# Patient Record
Sex: Male | Born: 1960 | Race: Black or African American | Hispanic: No | Marital: Single | Smoking: Never smoker
Health system: Southern US, Community
[De-identification: ages and names within clinical notes are randomized; demographics above are authoritative.]

## PROBLEM LIST (undated history)

## (undated) DIAGNOSIS — I10 Essential (primary) hypertension: Secondary | ICD-10-CM

## (undated) DIAGNOSIS — T7840XA Allergy, unspecified, initial encounter: Secondary | ICD-10-CM

## (undated) DIAGNOSIS — F32A Depression, unspecified: Secondary | ICD-10-CM

## (undated) DIAGNOSIS — F329 Major depressive disorder, single episode, unspecified: Secondary | ICD-10-CM

## (undated) DIAGNOSIS — E785 Hyperlipidemia, unspecified: Secondary | ICD-10-CM

## (undated) DIAGNOSIS — J189 Pneumonia, unspecified organism: Secondary | ICD-10-CM

## (undated) DIAGNOSIS — M1990 Unspecified osteoarthritis, unspecified site: Secondary | ICD-10-CM

## (undated) DIAGNOSIS — M109 Gout, unspecified: Secondary | ICD-10-CM

## (undated) DIAGNOSIS — G4730 Sleep apnea, unspecified: Secondary | ICD-10-CM

## (undated) DIAGNOSIS — K219 Gastro-esophageal reflux disease without esophagitis: Secondary | ICD-10-CM

## (undated) DIAGNOSIS — F79 Unspecified intellectual disabilities: Secondary | ICD-10-CM

## (undated) HISTORY — DX: Pneumonia, unspecified organism: J18.9

## (undated) HISTORY — DX: Unspecified osteoarthritis, unspecified site: M19.90

## (undated) HISTORY — PX: BRAIN SURGERY: SHX531

## (undated) HISTORY — DX: Gastro-esophageal reflux disease without esophagitis: K21.9

## (undated) HISTORY — DX: Depression, unspecified: F32.A

## (undated) HISTORY — DX: Allergy, unspecified, initial encounter: T78.40XA

## (undated) HISTORY — DX: Unspecified intellectual disabilities: F79

## (undated) HISTORY — DX: Sleep apnea, unspecified: G47.30

## (undated) HISTORY — DX: Major depressive disorder, single episode, unspecified: F32.9

---

## 1997-11-18 ENCOUNTER — Inpatient Hospital Stay (HOSPITAL_COMMUNITY): Admission: EM | Admit: 1997-11-18 | Discharge: 1997-11-22 | Payer: Self-pay | Admitting: Emergency Medicine

## 1997-12-29 ENCOUNTER — Inpatient Hospital Stay (HOSPITAL_COMMUNITY): Admission: EM | Admit: 1997-12-29 | Discharge: 1998-01-03 | Payer: Self-pay | Admitting: *Deleted

## 1998-01-06 ENCOUNTER — Encounter: Admission: RE | Admit: 1998-01-06 | Discharge: 1998-01-06 | Payer: Self-pay | Admitting: Family Medicine

## 1998-01-24 ENCOUNTER — Ambulatory Visit (HOSPITAL_COMMUNITY): Admission: RE | Admit: 1998-01-24 | Discharge: 1998-01-24 | Payer: Self-pay | Admitting: Sports Medicine

## 1998-02-02 ENCOUNTER — Ambulatory Visit: Admission: RE | Admit: 1998-02-02 | Discharge: 1998-02-02 | Payer: Self-pay | Admitting: *Deleted

## 1998-05-02 ENCOUNTER — Inpatient Hospital Stay (HOSPITAL_COMMUNITY): Admission: EM | Admit: 1998-05-02 | Discharge: 1998-05-06 | Payer: Self-pay | Admitting: Emergency Medicine

## 1998-05-02 ENCOUNTER — Encounter: Payer: Self-pay | Admitting: Emergency Medicine

## 1998-06-04 ENCOUNTER — Encounter: Payer: Self-pay | Admitting: Emergency Medicine

## 1998-06-04 ENCOUNTER — Inpatient Hospital Stay (HOSPITAL_COMMUNITY): Admission: EM | Admit: 1998-06-04 | Discharge: 1998-06-06 | Payer: Self-pay | Admitting: Emergency Medicine

## 1998-06-24 ENCOUNTER — Emergency Department (HOSPITAL_COMMUNITY): Admission: EM | Admit: 1998-06-24 | Discharge: 1998-06-24 | Payer: Self-pay | Admitting: Emergency Medicine

## 1998-07-03 ENCOUNTER — Inpatient Hospital Stay (HOSPITAL_COMMUNITY): Admission: EM | Admit: 1998-07-03 | Discharge: 1998-07-05 | Payer: Self-pay | Admitting: Emergency Medicine

## 1998-07-03 ENCOUNTER — Encounter: Payer: Self-pay | Admitting: Emergency Medicine

## 1998-07-03 ENCOUNTER — Encounter: Payer: Self-pay | Admitting: *Deleted

## 1998-07-22 ENCOUNTER — Inpatient Hospital Stay (HOSPITAL_COMMUNITY): Admission: EM | Admit: 1998-07-22 | Discharge: 1998-07-26 | Payer: Self-pay | Admitting: *Deleted

## 1998-07-23 ENCOUNTER — Encounter: Payer: Self-pay | Admitting: *Deleted

## 1999-03-12 ENCOUNTER — Emergency Department (HOSPITAL_COMMUNITY): Admission: EM | Admit: 1999-03-12 | Discharge: 1999-03-12 | Payer: Self-pay | Admitting: Emergency Medicine

## 1999-03-23 ENCOUNTER — Ambulatory Visit (HOSPITAL_COMMUNITY): Admission: RE | Admit: 1999-03-23 | Discharge: 1999-03-23 | Payer: Self-pay | Admitting: *Deleted

## 1999-04-11 ENCOUNTER — Ambulatory Visit (HOSPITAL_COMMUNITY): Admission: RE | Admit: 1999-04-11 | Discharge: 1999-04-11 | Payer: Self-pay | Admitting: *Deleted

## 1999-10-17 ENCOUNTER — Emergency Department (HOSPITAL_COMMUNITY): Admission: EM | Admit: 1999-10-17 | Discharge: 1999-10-17 | Payer: Self-pay | Admitting: Emergency Medicine

## 2000-06-02 ENCOUNTER — Encounter: Payer: Self-pay | Admitting: Emergency Medicine

## 2000-06-02 ENCOUNTER — Emergency Department (HOSPITAL_COMMUNITY): Admission: EM | Admit: 2000-06-02 | Discharge: 2000-06-02 | Payer: Self-pay | Admitting: Emergency Medicine

## 2000-07-25 ENCOUNTER — Ambulatory Visit (HOSPITAL_BASED_OUTPATIENT_CLINIC_OR_DEPARTMENT_OTHER): Admission: RE | Admit: 2000-07-25 | Discharge: 2000-07-25 | Payer: Self-pay | Admitting: Internal Medicine

## 2000-12-26 ENCOUNTER — Emergency Department (HOSPITAL_COMMUNITY): Admission: EM | Admit: 2000-12-26 | Discharge: 2000-12-26 | Payer: Self-pay | Admitting: Emergency Medicine

## 2001-01-11 ENCOUNTER — Emergency Department (HOSPITAL_COMMUNITY): Admission: EM | Admit: 2001-01-11 | Discharge: 2001-01-12 | Payer: Self-pay | Admitting: Emergency Medicine

## 2001-05-31 ENCOUNTER — Inpatient Hospital Stay (HOSPITAL_COMMUNITY): Admission: EM | Admit: 2001-05-31 | Discharge: 2001-06-03 | Payer: Self-pay | Admitting: Nurse Practitioner

## 2001-05-31 ENCOUNTER — Encounter: Payer: Self-pay | Admitting: Emergency Medicine

## 2001-06-02 ENCOUNTER — Encounter: Payer: Self-pay | Admitting: Internal Medicine

## 2001-10-30 ENCOUNTER — Emergency Department (HOSPITAL_COMMUNITY): Admission: EM | Admit: 2001-10-30 | Discharge: 2001-10-30 | Payer: Self-pay | Admitting: Emergency Medicine

## 2002-03-02 ENCOUNTER — Encounter: Payer: Self-pay | Admitting: Emergency Medicine

## 2002-03-02 ENCOUNTER — Emergency Department (HOSPITAL_COMMUNITY): Admission: EM | Admit: 2002-03-02 | Discharge: 2002-03-02 | Payer: Self-pay | Admitting: Emergency Medicine

## 2003-01-06 ENCOUNTER — Emergency Department (HOSPITAL_COMMUNITY): Admission: EM | Admit: 2003-01-06 | Discharge: 2003-01-06 | Payer: Self-pay | Admitting: Emergency Medicine

## 2003-01-06 ENCOUNTER — Encounter: Payer: Self-pay | Admitting: Emergency Medicine

## 2003-09-14 ENCOUNTER — Emergency Department (HOSPITAL_COMMUNITY): Admission: EM | Admit: 2003-09-14 | Discharge: 2003-09-14 | Payer: Self-pay | Admitting: Emergency Medicine

## 2003-10-17 ENCOUNTER — Emergency Department (HOSPITAL_COMMUNITY): Admission: EM | Admit: 2003-10-17 | Discharge: 2003-10-17 | Payer: Self-pay | Admitting: Emergency Medicine

## 2003-11-04 ENCOUNTER — Emergency Department (HOSPITAL_COMMUNITY): Admission: EM | Admit: 2003-11-04 | Discharge: 2003-11-04 | Payer: Self-pay | Admitting: Emergency Medicine

## 2004-06-09 ENCOUNTER — Emergency Department (HOSPITAL_COMMUNITY): Admission: EM | Admit: 2004-06-09 | Discharge: 2004-06-09 | Payer: Self-pay | Admitting: *Deleted

## 2004-07-24 ENCOUNTER — Emergency Department (HOSPITAL_COMMUNITY): Admission: EM | Admit: 2004-07-24 | Discharge: 2004-07-24 | Payer: Self-pay | Admitting: Emergency Medicine

## 2004-10-05 ENCOUNTER — Ambulatory Visit: Payer: Self-pay | Admitting: Family Medicine

## 2004-10-17 ENCOUNTER — Emergency Department (HOSPITAL_COMMUNITY): Admission: EM | Admit: 2004-10-17 | Discharge: 2004-10-17 | Payer: Self-pay | Admitting: Emergency Medicine

## 2004-10-18 ENCOUNTER — Ambulatory Visit: Payer: Self-pay | Admitting: Nurse Practitioner

## 2004-10-27 ENCOUNTER — Ambulatory Visit: Payer: Self-pay | Admitting: Family Medicine

## 2004-10-31 ENCOUNTER — Ambulatory Visit: Payer: Self-pay | Admitting: Family Medicine

## 2005-01-01 ENCOUNTER — Ambulatory Visit: Payer: Self-pay | Admitting: Family Medicine

## 2005-02-27 ENCOUNTER — Emergency Department (HOSPITAL_COMMUNITY): Admission: EM | Admit: 2005-02-27 | Discharge: 2005-02-27 | Payer: Self-pay | Admitting: Emergency Medicine

## 2005-05-08 ENCOUNTER — Emergency Department (HOSPITAL_COMMUNITY): Admission: EM | Admit: 2005-05-08 | Discharge: 2005-05-08 | Payer: Self-pay | Admitting: Emergency Medicine

## 2005-10-04 ENCOUNTER — Ambulatory Visit: Payer: Self-pay | Admitting: Family Medicine

## 2005-12-21 ENCOUNTER — Emergency Department (HOSPITAL_COMMUNITY): Admission: EM | Admit: 2005-12-21 | Discharge: 2005-12-21 | Payer: Self-pay | Admitting: Emergency Medicine

## 2006-05-06 ENCOUNTER — Emergency Department (HOSPITAL_COMMUNITY): Admission: EM | Admit: 2006-05-06 | Discharge: 2006-05-06 | Payer: Self-pay | Admitting: Emergency Medicine

## 2006-06-12 ENCOUNTER — Ambulatory Visit: Payer: Self-pay | Admitting: Family Medicine

## 2006-09-02 ENCOUNTER — Emergency Department (HOSPITAL_COMMUNITY): Admission: EM | Admit: 2006-09-02 | Discharge: 2006-09-02 | Payer: Self-pay | Admitting: Emergency Medicine

## 2006-10-09 ENCOUNTER — Ambulatory Visit: Payer: Self-pay | Admitting: Family Medicine

## 2006-11-22 ENCOUNTER — Emergency Department (HOSPITAL_COMMUNITY): Admission: EM | Admit: 2006-11-22 | Discharge: 2006-11-22 | Payer: Self-pay | Admitting: Emergency Medicine

## 2007-01-03 ENCOUNTER — Emergency Department (HOSPITAL_COMMUNITY): Admission: EM | Admit: 2007-01-03 | Discharge: 2007-01-03 | Payer: Self-pay | Admitting: Emergency Medicine

## 2007-04-01 ENCOUNTER — Ambulatory Visit: Payer: Self-pay | Admitting: Family Medicine

## 2007-05-18 ENCOUNTER — Emergency Department (HOSPITAL_COMMUNITY): Admission: EM | Admit: 2007-05-18 | Discharge: 2007-05-18 | Payer: Self-pay | Admitting: Emergency Medicine

## 2007-05-29 ENCOUNTER — Emergency Department (HOSPITAL_COMMUNITY): Admission: EM | Admit: 2007-05-29 | Discharge: 2007-05-29 | Payer: Self-pay | Admitting: Emergency Medicine

## 2007-06-20 ENCOUNTER — Emergency Department (HOSPITAL_COMMUNITY): Admission: EM | Admit: 2007-06-20 | Discharge: 2007-06-20 | Payer: Self-pay | Admitting: Emergency Medicine

## 2007-10-14 ENCOUNTER — Emergency Department (HOSPITAL_COMMUNITY): Admission: EM | Admit: 2007-10-14 | Discharge: 2007-10-14 | Payer: Self-pay | Admitting: Emergency Medicine

## 2007-10-16 ENCOUNTER — Emergency Department (HOSPITAL_COMMUNITY): Admission: EM | Admit: 2007-10-16 | Discharge: 2007-10-16 | Payer: Self-pay | Admitting: Emergency Medicine

## 2007-10-21 ENCOUNTER — Ambulatory Visit: Payer: Self-pay | Admitting: Family Medicine

## 2008-02-18 ENCOUNTER — Emergency Department (HOSPITAL_COMMUNITY): Admission: EM | Admit: 2008-02-18 | Discharge: 2008-02-18 | Payer: Self-pay | Admitting: Emergency Medicine

## 2008-03-09 ENCOUNTER — Emergency Department (HOSPITAL_COMMUNITY): Admission: EM | Admit: 2008-03-09 | Discharge: 2008-03-09 | Payer: Self-pay | Admitting: Emergency Medicine

## 2008-04-30 ENCOUNTER — Ambulatory Visit: Payer: Self-pay | Admitting: Internal Medicine

## 2008-08-31 ENCOUNTER — Ambulatory Visit: Payer: Self-pay | Admitting: Family Medicine

## 2008-09-27 ENCOUNTER — Emergency Department (HOSPITAL_COMMUNITY): Admission: EM | Admit: 2008-09-27 | Discharge: 2008-09-27 | Payer: Self-pay | Admitting: Emergency Medicine

## 2008-12-13 ENCOUNTER — Emergency Department (HOSPITAL_COMMUNITY): Admission: EM | Admit: 2008-12-13 | Discharge: 2008-12-13 | Payer: Self-pay | Admitting: Emergency Medicine

## 2009-02-08 ENCOUNTER — Ambulatory Visit: Payer: Self-pay | Admitting: Family Medicine

## 2009-02-21 ENCOUNTER — Emergency Department (HOSPITAL_COMMUNITY): Admission: EM | Admit: 2009-02-21 | Discharge: 2009-02-21 | Payer: Self-pay | Admitting: Emergency Medicine

## 2009-04-03 ENCOUNTER — Emergency Department (HOSPITAL_COMMUNITY): Admission: EM | Admit: 2009-04-03 | Discharge: 2009-04-03 | Payer: Self-pay | Admitting: Emergency Medicine

## 2009-04-12 ENCOUNTER — Emergency Department (HOSPITAL_COMMUNITY): Admission: EM | Admit: 2009-04-12 | Discharge: 2009-04-12 | Payer: Self-pay | Admitting: Emergency Medicine

## 2009-04-14 ENCOUNTER — Emergency Department (HOSPITAL_COMMUNITY): Admission: EM | Admit: 2009-04-14 | Discharge: 2009-04-14 | Payer: Self-pay | Admitting: Emergency Medicine

## 2009-04-20 ENCOUNTER — Telehealth (INDEPENDENT_AMBULATORY_CARE_PROVIDER_SITE_OTHER): Payer: Self-pay | Admitting: *Deleted

## 2009-06-07 ENCOUNTER — Ambulatory Visit: Payer: Self-pay | Admitting: Family Medicine

## 2009-11-21 ENCOUNTER — Emergency Department (HOSPITAL_COMMUNITY): Admission: EM | Admit: 2009-11-21 | Discharge: 2009-11-21 | Payer: Self-pay | Admitting: Emergency Medicine

## 2009-12-03 ENCOUNTER — Emergency Department (HOSPITAL_COMMUNITY): Admission: EM | Admit: 2009-12-03 | Discharge: 2009-12-03 | Payer: Self-pay | Admitting: Emergency Medicine

## 2010-03-20 ENCOUNTER — Ambulatory Visit: Payer: Self-pay | Admitting: Infectious Diseases

## 2010-03-20 ENCOUNTER — Inpatient Hospital Stay (HOSPITAL_COMMUNITY): Admission: EM | Admit: 2010-03-20 | Discharge: 2010-03-23 | Payer: Self-pay | Admitting: Emergency Medicine

## 2010-08-06 ENCOUNTER — Emergency Department (HOSPITAL_COMMUNITY)
Admission: EM | Admit: 2010-08-06 | Discharge: 2010-08-06 | Disposition: A | Discharge status: Home / Self Care | Payer: Medicaid Other | Attending: Emergency Medicine | Admitting: Emergency Medicine

## 2010-08-06 DIAGNOSIS — M25476 Effusion, unspecified foot: Secondary | ICD-10-CM | POA: Insufficient documentation

## 2010-08-06 DIAGNOSIS — J45909 Unspecified asthma, uncomplicated: Secondary | ICD-10-CM | POA: Insufficient documentation

## 2010-08-06 DIAGNOSIS — Z862 Personal history of diseases of the blood and blood-forming organs and certain disorders involving the immune mechanism: Secondary | ICD-10-CM | POA: Insufficient documentation

## 2010-08-06 DIAGNOSIS — M25473 Effusion, unspecified ankle: Secondary | ICD-10-CM | POA: Insufficient documentation

## 2010-08-06 DIAGNOSIS — L02419 Cutaneous abscess of limb, unspecified: Secondary | ICD-10-CM | POA: Insufficient documentation

## 2010-08-06 DIAGNOSIS — L03119 Cellulitis of unspecified part of limb: Secondary | ICD-10-CM | POA: Insufficient documentation

## 2010-08-06 DIAGNOSIS — Z79899 Other long term (current) drug therapy: Secondary | ICD-10-CM | POA: Insufficient documentation

## 2010-08-06 DIAGNOSIS — Z8639 Personal history of other endocrine, nutritional and metabolic disease: Secondary | ICD-10-CM | POA: Insufficient documentation

## 2010-08-06 DIAGNOSIS — M25579 Pain in unspecified ankle and joints of unspecified foot: Secondary | ICD-10-CM | POA: Insufficient documentation

## 2010-08-06 DIAGNOSIS — I10 Essential (primary) hypertension: Secondary | ICD-10-CM | POA: Insufficient documentation

## 2010-08-06 DIAGNOSIS — K219 Gastro-esophageal reflux disease without esophagitis: Secondary | ICD-10-CM | POA: Insufficient documentation

## 2010-09-07 LAB — CBC
Hemoglobin: 11.7 g/dL — ABNORMAL LOW (ref 13.0–17.0)
Hemoglobin: 13 g/dL (ref 13.0–17.0)
MCH: 23.6 pg — ABNORMAL LOW (ref 26.0–34.0)
MCHC: 31.3 g/dL (ref 30.0–36.0)
MCHC: 32.2 g/dL (ref 30.0–36.0)
MCV: 75.4 fL — ABNORMAL LOW (ref 78.0–100.0)
Platelets: 163 10*3/uL (ref 150–400)
Platelets: 191 10*3/uL (ref 150–400)
RDW: 16.2 % — ABNORMAL HIGH (ref 11.5–15.5)
WBC: 4.1 10*3/uL (ref 4.0–10.5)

## 2010-09-07 LAB — COMPREHENSIVE METABOLIC PANEL
ALT: 16 U/L (ref 0–53)
AST: 21 U/L (ref 0–37)
CO2: 27 mEq/L (ref 19–32)
Calcium: 8.8 mg/dL (ref 8.4–10.5)
GFR calc Af Amer: >60 mL/min (ref >60)
Sodium: 138 mEq/L (ref 135–145)
Total Protein: 8 g/dL (ref 6.0–8.3)

## 2010-09-07 LAB — BASIC METABOLIC PANEL
CO2: 29 mEq/L (ref 19–32)
Calcium: 9 mg/dL (ref 8.4–10.5)
Creatinine, Ser: 0.91 mg/dL (ref 0.4–1.5)
Glucose, Bld: 116 mg/dL — ABNORMAL HIGH (ref 70–99)

## 2010-09-07 LAB — DIFFERENTIAL
Eosinophils Absolute: 0 10*3/uL (ref 0.0–0.7)
Eosinophils Relative: 0 % (ref 0–5)
Lymphs Abs: 0.6 10*3/uL — ABNORMAL LOW (ref 0.7–4.0)
Monocytes Absolute: 0.2 10*3/uL (ref 0.1–1.0)
Monocytes Relative: 4 % (ref 3–12)

## 2010-09-07 LAB — HEMOGLOBIN A1C: Hgb A1c MFr Bld: 6.3 % — ABNORMAL HIGH (ref <5.7)

## 2010-09-07 LAB — CK TOTAL AND CKMB (NOT AT ARMC)
CK, MB: 1.7 ng/mL (ref 0.3–4.0)
Total CK: 122 U/L (ref 7–232)

## 2010-09-07 LAB — CARDIAC PANEL(CRET KIN+CKTOT+MB+TROPI)
Relative Index: 1.5 (ref 0.0–2.5)
Total CK: 115 U/L (ref 7–232)
Troponin I: 0.03 ng/mL (ref 0.00–0.06)

## 2010-10-04 LAB — DIFFERENTIAL
Basophils Absolute: 0 10*3/uL (ref 0.0–0.1)
Basophils Relative: 0 % (ref 0–1)
Eosinophils Absolute: 0.2 10*3/uL (ref 0.0–0.7)
Monocytes Relative: 8 % (ref 3–12)
Neutro Abs: 3.7 10*3/uL (ref 1.7–7.7)
Neutrophils Relative %: 53 % (ref 43–77)

## 2010-10-04 LAB — BASIC METABOLIC PANEL
BUN: 7 mg/dL (ref 6–23)
CO2: 31 mEq/L (ref 19–32)
Calcium: 9.6 mg/dL (ref 8.4–10.5)
Creatinine, Ser: 0.8 mg/dL (ref 0.4–1.5)
GFR calc Af Amer: >60 mL/min (ref >60)

## 2010-10-04 LAB — CBC
MCHC: 31.7 g/dL (ref 30.0–36.0)
Platelets: 171 10*3/uL (ref 150–400)
RBC: 5.65 MIL/uL (ref 4.22–5.81)
RDW: 15.8 % — ABNORMAL HIGH (ref 11.5–15.5)

## 2010-10-23 ENCOUNTER — Emergency Department (HOSPITAL_COMMUNITY)
Admission: EM | Admit: 2010-10-23 | Discharge: 2010-10-24 | Disposition: A | Discharge status: Home / Self Care | Payer: Medicare Other | Attending: Emergency Medicine | Admitting: Emergency Medicine

## 2010-10-23 DIAGNOSIS — R112 Nausea with vomiting, unspecified: Secondary | ICD-10-CM | POA: Insufficient documentation

## 2010-10-23 DIAGNOSIS — Z8639 Personal history of other endocrine, nutritional and metabolic disease: Secondary | ICD-10-CM | POA: Insufficient documentation

## 2010-10-23 DIAGNOSIS — Z79899 Other long term (current) drug therapy: Secondary | ICD-10-CM | POA: Insufficient documentation

## 2010-10-23 DIAGNOSIS — R197 Diarrhea, unspecified: Secondary | ICD-10-CM | POA: Insufficient documentation

## 2010-10-23 DIAGNOSIS — I10 Essential (primary) hypertension: Secondary | ICD-10-CM | POA: Insufficient documentation

## 2010-10-23 DIAGNOSIS — R109 Unspecified abdominal pain: Secondary | ICD-10-CM | POA: Insufficient documentation

## 2010-10-23 DIAGNOSIS — J45909 Unspecified asthma, uncomplicated: Secondary | ICD-10-CM | POA: Insufficient documentation

## 2010-10-23 DIAGNOSIS — Z862 Personal history of diseases of the blood and blood-forming organs and certain disorders involving the immune mechanism: Secondary | ICD-10-CM | POA: Insufficient documentation

## 2010-10-23 DIAGNOSIS — K219 Gastro-esophageal reflux disease without esophagitis: Secondary | ICD-10-CM | POA: Insufficient documentation

## 2010-10-23 LAB — DIFFERENTIAL
Basophils Absolute: 0 10*3/uL (ref 0.0–0.1)
Basophils Relative: 0 % (ref 0–1)
Eosinophils Absolute: 0.2 10*3/uL (ref 0.0–0.7)
Eosinophils Relative: 3 % (ref 0–5)
Lymphocytes Relative: 57 % — ABNORMAL HIGH (ref 12–46)
Lymphs Abs: 3.8 K/uL (ref 0.7–4.0)
Monocytes Absolute: 0.6 K/uL (ref 0.1–1.0)
Monocytes Relative: 9 % (ref 3–12)
Neutro Abs: 2.1 10*3/uL (ref 1.7–7.7)
Neutrophils Relative %: 32 % — ABNORMAL LOW (ref 43–77)

## 2010-10-23 LAB — CBC
HCT: 41.2 % (ref 39.0–52.0)
Hemoglobin: 13.2 g/dL (ref 13.0–17.0)
MCH: 24 pg — ABNORMAL LOW (ref 26.0–34.0)
MCHC: 32 g/dL (ref 30.0–36.0)
MCV: 75 fL — ABNORMAL LOW (ref 78.0–100.0)
Platelets: 189 10*3/uL (ref 150–400)
RBC: 5.49 MIL/uL (ref 4.22–5.81)
RDW: 16 % — ABNORMAL HIGH (ref 11.5–15.5)
WBC: 6.7 K/uL (ref 4.0–10.5)

## 2010-10-23 LAB — URINALYSIS, ROUTINE W REFLEX MICROSCOPIC
Nitrite: NEGATIVE
Protein, ur: NEGATIVE mg/dL
Specific Gravity, Urine: 1.024 (ref 1.005–1.030)
Urobilinogen, UA: 1 mg/dL (ref 0.0–1.0)

## 2010-10-24 LAB — COMPREHENSIVE METABOLIC PANEL WITH GFR
Alkaline Phosphatase: 92 U/L (ref 39–117)
BUN: 8 mg/dL (ref 6–23)
Glucose, Bld: 93 mg/dL (ref 70–99)
Potassium: 3.7 meq/L (ref 3.5–5.1)
Total Protein: 7.1 g/dL (ref 6.0–8.3)

## 2010-10-24 LAB — COMPREHENSIVE METABOLIC PANEL
ALT: 45 U/L (ref 0–53)
AST: 22 U/L (ref 0–37)
Albumin: 3.4 g/dL — ABNORMAL LOW (ref 3.5–5.2)
CO2: 27 mEq/L (ref 19–32)
Calcium: 9.1 mg/dL (ref 8.4–10.5)
Chloride: 108 mEq/L (ref 96–112)
Creatinine, Ser: 0.83 mg/dL (ref 0.4–1.5)
GFR calc Af Amer: >60 mL/min (ref >60)
GFR calc non Af Amer: >60 mL/min (ref >60)
Sodium: 140 mEq/L (ref 135–145)
Total Bilirubin: 0.4 mg/dL (ref 0.3–1.2)

## 2010-10-24 LAB — TROPONIN I: Troponin I: 0.03 ng/mL (ref 0.00–0.06)

## 2010-10-24 LAB — LIPASE, BLOOD: Lipase: 17 U/L (ref 11–59)

## 2010-11-10 NOTE — Discharge Summary (Signed)
[place]  Patient:    Roberto Horne, Roberto Horne Visit Number: [id number] MRN: [id number]          Service Type: MED Location: PEDS 6120 [id number] Attending Physician:  Madaline Guthrie Dictated by:   Blanch Media, M.D. Admit Date:  05/31/2001 Discharge Date: 06/03/2001                             Discharge Summary  Dictated by Doylene Canning, M.S. IV.  DISCHARGE DIAGNOSES: 1. Asthma. 2. Mild mental retardation. 3. Congestive heart failure, core pulmonale (echo May 1999 showing right    atrial enlargement with normal left ventricular function). 4. Obesity. 5. Obstructive sleep apnea. 6. History of iron-deficiency anemia.  DISCHARGE MEDICATIONS:  1. Darvocet-N 100 one tablet p.o. q.4h. p.r.n. pain.  2. Accolate 20 mg one tablet p.o. b.i.d.  3. Temazepam 15 mg one tablet p.o. q.h.s.  4. Adalat 60 mg one tablet p.o. q.d.  5. Nasonex two sprays each nostril once a day.  6. Protonix 40 mg one tablet p.o. q.d.  7. Claritin D one tablet p.o. q.d.  8. Albuterol inhaler one to two puffs every four to six hours p.r.n.  9. Serevent Diskus two puffs every 12 hours. 10. Pulmicort Turbuhaler one to two puffs q.d. 11. Tequin 400 mg p.o. q.d. x10 days.  HISTORY OF PRESENT ILLNESS:  The patient is a 50 year old black male with past medical history significant for asthma and mild mental retardation who presents with fever, chills, sweats, cough productive of green sputum, and aching pain diffusely all over his body since yesterday.  The patient is unsure how high his fever was yesterday. He has been taking all of his medications as usual except for Nasonex which he recently ran out of.  The patient reports a remote history of pneumonia approximately 72 years of age as a child.  The patient denies sick contacts. The patient denies abdominal pain, nausea, and vomiting. The patient also denies difficulty breathing and shortness of breath.  The patient reports he was unable  to sleep last night due to severe aching pain all over.  HOSPITAL COURSE:  #1 - PNEUMONIA:  On admission, the patients lung exam showed coarse breath sounds diffusely rhonchorous bilaterally with mild expiratory wheezing.  Chest x-ray showed mild bibasilar patchy infiltrates.  Admission white count was 13.3.  Blood cultures x2 were obtained. The patient was started on Rocephin plus azithromycin.  The patient remained afebrile during the course of the admission.  Temperature upon admission was 102.4.  The patients white count also decreased during the admission. On the last day of admission the white count was 6.6.  Chest x-ray on the day of discharge demonstrated slightly increased bibasilar infiltrates; however, the patient was improving clinically, remaining afebrile with a decreased white count, decreased pain, no shortness of breath, and improved cough.  On the last day of the hospital stay, the patient was changed from Rocephin plus azithromycin to Tequin 400 mg p.o. q.d. for antibiotic coverage.  The patient received a total of four doses of antibiotics while in hospital.  The patient was sent home with 10-day supply of Tequin.  #2 - OBSTRUCTIVE SLEEP APNEA:  The patient was placed on BiPAP machine, settings of 12.5.  This is same as his home settings.  #3 - ASTHMA:  The patient was placed on home medications. No changes were made to patients asthma regimen.  The patients pulmonary symptoms improved with antibiotic  treatment.  FOLLOW-UP:  The patient has a follow-up appointment at [facility] in one week on June 10, 2001, at 11:45 a.m. Dictated by:   Blanch Media, M.D. Attending Physician:  Madaline Guthrie DD:  06/04/01 TD:  06/04/01 Job: [id number]

## 2011-08-07 DIAGNOSIS — I10 Essential (primary) hypertension: Secondary | ICD-10-CM | POA: Diagnosis not present

## 2011-08-07 DIAGNOSIS — J019 Acute sinusitis, unspecified: Secondary | ICD-10-CM | POA: Diagnosis not present

## 2011-08-14 DIAGNOSIS — I10 Essential (primary) hypertension: Secondary | ICD-10-CM | POA: Diagnosis not present

## 2011-10-02 ENCOUNTER — Emergency Department (HOSPITAL_COMMUNITY): Payer: Medicare Other

## 2011-10-02 ENCOUNTER — Encounter (HOSPITAL_COMMUNITY): Payer: Self-pay | Admitting: *Deleted

## 2011-10-02 ENCOUNTER — Emergency Department (HOSPITAL_COMMUNITY)
Admission: EM | Admit: 2011-10-02 | Discharge: 2011-10-02 | Disposition: A | Discharge status: Home / Self Care | Payer: Medicare Other | Attending: Emergency Medicine | Admitting: Emergency Medicine

## 2011-10-02 DIAGNOSIS — R1110 Vomiting, unspecified: Secondary | ICD-10-CM | POA: Diagnosis not present

## 2011-10-02 DIAGNOSIS — J189 Pneumonia, unspecified organism: Secondary | ICD-10-CM | POA: Diagnosis not present

## 2011-10-02 DIAGNOSIS — E785 Hyperlipidemia, unspecified: Secondary | ICD-10-CM | POA: Insufficient documentation

## 2011-10-02 DIAGNOSIS — J45909 Unspecified asthma, uncomplicated: Secondary | ICD-10-CM | POA: Insufficient documentation

## 2011-10-02 DIAGNOSIS — R109 Unspecified abdominal pain: Secondary | ICD-10-CM | POA: Diagnosis not present

## 2011-10-02 DIAGNOSIS — I10 Essential (primary) hypertension: Secondary | ICD-10-CM | POA: Insufficient documentation

## 2011-10-02 DIAGNOSIS — K7689 Other specified diseases of liver: Secondary | ICD-10-CM | POA: Diagnosis not present

## 2011-10-02 HISTORY — DX: Hyperlipidemia, unspecified: E78.5

## 2011-10-02 HISTORY — DX: Essential (primary) hypertension: I10

## 2011-10-02 LAB — CBC
Platelets: 156 10*3/uL (ref 150–400)
RBC: 5.54 MIL/uL (ref 4.22–5.81)
WBC: 11.9 10*3/uL — ABNORMAL HIGH (ref 4.0–10.5)

## 2011-10-02 LAB — BASIC METABOLIC PANEL
CO2: 29 mEq/L (ref 19–32)
GFR calc non Af Amer: >90 mL/min (ref >90)
Glucose, Bld: 98 mg/dL (ref 70–99)
Potassium: 4.2 mEq/L (ref 3.5–5.1)
Sodium: 139 mEq/L (ref 135–145)

## 2011-10-02 LAB — DIFFERENTIAL
Eosinophils Absolute: 0.2 10*3/uL (ref 0.0–0.7)
Lymphocytes Relative: 12 % (ref 12–46)
Lymphs Abs: 1.5 10*3/uL (ref 0.7–4.0)
Neutrophils Relative %: 76 % (ref 43–77)

## 2011-10-02 MED ORDER — AZITHROMYCIN 250 MG PO TABS
1000.0000 mg | ORAL_TABLET | Freq: Once | ORAL | Status: AC
  Administered 2011-10-02: 1000 mg via ORAL
  Filled 2011-10-02: qty 4

## 2011-10-02 MED ORDER — PHENYLEPH-PROMETHAZINE-COD 5-6.25-10 MG/5ML PO SYRP: 5.0000 mL | ORAL_SOLUTION | ORAL | Status: DC | PRN

## 2011-10-02 MED ORDER — MORPHINE SULFATE 4 MG/ML IJ SOLN
8.0000 mg | Freq: Once | INTRAMUSCULAR | Status: AC
  Administered 2011-10-02: 8 mg via INTRAVENOUS
  Filled 2011-10-02 (×2): qty 1

## 2011-10-02 MED ORDER — ONDANSETRON HCL 4 MG/2ML IJ SOLN
4.0000 mg | INTRAMUSCULAR | Status: DC | PRN
  Administered 2011-10-02: 4 mg via INTRAVENOUS
  Filled 2011-10-02: qty 2

## 2011-10-02 MED ORDER — SODIUM CHLORIDE 0.9 % IV SOLN
Freq: Once | INTRAVENOUS | Status: AC
  Administered 2011-10-02: 12:00:00 via INTRAVENOUS

## 2011-10-02 MED ORDER — AZITHROMYCIN 250 MG PO TABS: 250.0000 mg | ORAL_TABLET | Freq: Every day | ORAL | Status: AC

## 2011-10-02 NOTE — ED Provider Notes (Signed)
History     CSN: [id number]  Arrival date & time 10/02/11  1308   First MD Initiated Contact with Patient 10/02/11 1023      Chief Complaint  Patient presents with  . Abdominal Pain  . Emesis    (Consider location/radiation/quality/duration/timing/severity/associated sxs/prior treatment) HPI Comments: Patient with history of hypertension hyperlipidemia and asthma presents to the emergency department with chief complaint of right-sided pain.  Onset of symptoms began 3 days ago and described as intermittent dull pain rated at a 8/10 in severity.  Progression is gradually worsening associated symptoms include night sweats, chills, cough, and rhinorrhea.  Patient denies fevers, nausea, vomiting, pleurisy, shortness of breath, chest pain  Patient is a 51 y.o. male presenting with abdominal pain and vomiting. The history is provided by the patient.  Abdominal Pain The primary symptoms of the illness include abdominal pain and vomiting.  Emesis  Associated symptoms include abdominal pain.    Past Medical History  Diagnosis Date  . Hypertension   . Asthma   . Hyperlipemia     History reviewed. No pertinent past surgical history.  No family history on file.  History  Substance Use Topics  . Smoking status: Never Smoker   . Smokeless tobacco: Not on file  . Alcohol Use: No      Review of Systems  Gastrointestinal: Positive for vomiting and abdominal pain.  All other systems reviewed and are negative.    Allergies  Ibuprofen  Home Medications   Current Outpatient Rx  Name Route Sig Dispense Refill  . FLUTICASONE-SALMETEROL 115-21 MCG/ACT IN AERO Inhalation Inhale 2 puffs into the lungs 2 (two) times daily.    Marland Kitchen MONTELUKAST SODIUM 10 MG PO TABS Oral Take 10 mg by mouth at bedtime.    Marland Kitchen NIFEDIPINE ER 30 MG PO TB24 Oral Take 30 mg by mouth daily.    Marland Kitchen OMEPRAZOLE 20 MG PO CPDR Oral Take 20 mg by mouth daily.      BP 124/65  Pulse 90  Temp(Src) 98.6 F (37 C) (Oral)   Resp 24  Ht 6\' 2"  (1.88 m)  SpO2 92%  Physical Exam  Constitutional: He is oriented to person, place, and time. He appears well-developed and well-nourished. No distress.  HENT:  Head: Normocephalic and atraumatic. No trismus in the jaw.  Right Ear: External ear normal. No drainage or tenderness. No mastoid tenderness.  Left Ear: External ear normal. No drainage or tenderness. No mastoid tenderness.  Nose: Nose normal. No rhinorrhea or sinus tenderness.  Mouth/Throat: Uvula is midline, oropharynx is clear and moist and mucous membranes are normal. No uvula swelling. No oropharyngeal exudate.  Eyes: Conjunctivae and EOM are normal. Right eye exhibits no discharge. Left eye exhibits no discharge. No scleral icterus.  Neck: Normal range of motion. Neck supple.  Cardiovascular: Normal rate, regular rhythm and normal heart sounds.   Pulmonary/Chest: Effort normal and breath sounds normal. No stridor. No respiratory distress. He has no wheezes. He exhibits tenderness.       LCAB  Abdominal: Soft. There is no tenderness.       Obese, non tender to palpation  Genitourinary:       Right CVA tenderness   Musculoskeletal: Normal range of motion.  Neurological: He is alert and oriented to person, place, and time.  Skin: Skin is warm and dry. No rash noted. He is not diaphoretic.  Psychiatric: He has a normal mood and affect. His behavior is normal.    ED Course  Procedures (  including critical care time)  Labs Reviewed  CBC - Abnormal; Notable for the following:    WBC 11.9 (*)    MCV 76.2 (*)    MCH 24.4 (*)    All other components within normal limits  DIFFERENTIAL - Abnormal; Notable for the following:    Neutro Abs 9.0 (*)    Monocytes Absolute 1.3 (*)    All other components within normal limits  BASIC METABOLIC PANEL   Ct Abdomen Pelvis Wo Contrast  10/02/2011  *RADIOLOGY REPORT*  Clinical Data: Right flank pain  CT ABDOMEN AND PELVIS WITHOUT CONTRAST  Technique:  Multidetector  CT imaging of the abdomen and pelvis was performed following the standard protocol without intravenous contrast.  Comparison: 09/02/2006  Findings: There is patchy airspace disease in the right lower lobe posteromedially highly suspicious for infiltrate/pneumonia.  Study is markedly limited by streak artifacts from the patient's large body habitus.  There is fatty infiltration of the liver.  No calcified gallstones are noted within gallbladder.  No aortic aneurysm.  Unenhanced pancreas, spleen and adrenals are unremarkable.  There is a low density lesion in the lower pole of the left kidney measures 4 cm.  This may represent a cyst.  Cannot be characterized without IV contrast.  No nephrolithiasis.  No hydronephrosis or hydroureter.  No calcified ureteral calculi are noted bilaterally.  Small umbilical hernia containing fat without evidence of acute complication.  There is no small bowel obstruction.  No ascites or free air.  No adenopathy.  Normal appendix is partially visualized in axial image 66.  No pericecal inflammation.  Bilateral distal ureter is unremarkable.  The urinary bladder is under distended grossly unremarkable.  Sagittal images of the spine shows degenerative changes lumbar spine with significant disc space flattening anterior spurring and vacuum disc phenomenon at L4-L5 and L5-S1 level.  IMPRESSION:  1.  There is patchy airspace disease in the right lower lobe posteromedially suspicious for infiltrate/pneumonia  2.  Fatty infiltration of the liver is noted. 3.  Probable low-density cyst in the lower pole of the left kidney measures 4 cm. 4.  No hydronephrosis or hydroureter.  No calcified ureteral calculi are noted. 5. Normal appendix is partially visualized.  Original Report Authenticated By: Natasha Mead, M.D.     No diagnosis found.    MDM  CAP  Discussed results of CT w patient, including left kidney cyst. Recommended follow. Pt will be treated w azithromycin in ED and sent home with  pain medication. F-u in 2 weeks for repeat x ray recommended. Pts pain managed in the ED, he has no complaints w VSS and NAD. Pt agreeable with plan to dc. Discussed plan w Dr. Freida Busman who agrees as well.   BP 115/61  Pulse 92  Temp(Src) 99.6 F (37.6 C) (Oral)  Resp 20  Ht 6\' 2"  (1.88 m)  SpO2 94%       Jaci Carrel, PA-C 10/02/11 1337

## 2011-10-02 NOTE — Discharge Instructions (Signed)
Take medications as prescribed. Followup with your doctor in regards to your hospital visit. If you do not have a doctor use the resource guide listed below to help you find one. You may return to the emergency department if symptoms worsen, become progressive, or become more concerning.  As we discussed he should get followup within 1-2 weeks to assure that your pneumonia has cleared.  Be sure to let your primary care physician know about the incidental finding on your CT exam that we also discussed of a small 4 mm cyst located on the left kidney.  Pneumonia, Adult Pneumonia is an infection of the lungs.  CAUSES Pneumonia may be caused by bacteria or a virus. Usually, these infections are caused by breathing infectious particles into the lungs (respiratory tract). SYMPTOMS   Cough.   Fever.   Chest pain.   Increased rate of breathing.   Wheezing.   Mucus production.  DIAGNOSIS  If you have the common symptoms of pneumonia, your caregiver will typically confirm the diagnosis with a chest X-ray. The X-ray will show an abnormality in the lung (pulmonary infiltrate) if you have pneumonia. Other tests of your blood, urine, or sputum may be done to find the specific cause of your pneumonia. Your caregiver may also do tests (blood gases or pulse oximetry) to see how well your lungs are working. TREATMENT  Some forms of pneumonia may be spread to other people when you cough or sneeze. You may be asked to wear a mask before and during your exam. Pneumonia that is caused by bacteria is treated with antibiotic medicine. Pneumonia that is caused by the influenza virus may be treated with an antiviral medicine. Most other viral infections must run their course. These infections will not respond to antibiotics.  PREVENTION A pneumococcal shot (vaccine) is available to prevent a common bacterial cause of pneumonia. This is usually suggested for:  People over 1 years old.   Patients on chemotherapy.     People with chronic lung problems, such as bronchitis or emphysema.   People with immune system problems.  If you are over 65 or have a high risk condition, you may receive the pneumococcal vaccine if you have not received it before. In some countries, a routine influenza vaccine is also recommended. This vaccine can help prevent some cases of pneumonia.You may be offered the influenza vaccine as part of your care. If you smoke, it is time to quit. You may receive instructions on how to stop smoking. Your caregiver can provide medicines and counseling to help you quit. HOME CARE INSTRUCTIONS   Cough suppressants may be used if you are losing too much rest. However, coughing protects you by clearing your lungs. You should avoid using cough suppressants if you can.   Your caregiver may have prescribed medicine if he or she thinks your pneumonia is caused by a bacteria or influenza. Finish your medicine even if you start to feel better.   Your caregiver may also prescribe an expectorant. This loosens the mucus to be coughed up.   Only take over-the-counter or prescription medicines for pain, discomfort, or fever as directed by your caregiver.   Do not smoke. Smoking is a common cause of bronchitis and can contribute to pneumonia. If you are a smoker and continue to smoke, your cough may last several weeks after your pneumonia has cleared.   A cold steam vaporizer or humidifier in your room or home may help loosen mucus.   Coughing is often  worse at night. Sleeping in a semi-upright position in a recliner or using a couple pillows under your head will help with this.   Get rest as you feel it is needed. Your body will usually let you know when you need to rest.  SEEK IMMEDIATE MEDICAL CARE IF:   Your illness becomes worse. This is especially true if you are elderly or weakened from any other disease.   You cannot control your cough with suppressants and are losing sleep.   You begin  coughing up blood.   You develop pain which is getting worse or is uncontrolled with medicines.   You have a fever.   Any of the symptoms which initially brought you in for treatment are getting worse rather than better.   You develop shortness of breath or chest pain.  MAKE SURE YOU:   Understand these instructions.   Will watch your condition.   Will get help right away if you are not doing well or get worse.   RESOURCE GUIDE  Dental Problems  Patients with Medicaid: [facility] [phone]                                           [phone] [address] [employer] [address] Phone:  [phone]                                                  Phone:  626-751-2100  If unable to pay or uninsured, contact:  [facility] or [facility]. to become qualified for the adult dental clinic.  Chronic Pain Problems Contact [facility]  [phone] Patients need to be referred by their primary care doctor.  Insufficient Money for Medicine Contact [employer] Way:  call "211" or [facility] 848-876-3898.  No Primary Care Doctor Call [facility]  [phone] Other agencies that provide inexpensive medical care    [facility]  [phone]    [facility]  [phone]    [facility]  4077823793    [facility]  [phone]    [facility]  574-617-6918    [facility]  [phone]  Psychological Services [facility] Behavioral Health  [phone] [facility] Services  [phone] [facility] Mental [facility]   [phone] (emergency services ([phone])  Substance Abuse Resources [facility]  [phone] [facility] 9470050015 [facility] [place] [phone] Floydene Flock [phone] Residential & Outpatient Substance Abuse Program  831-744-9401  Abuse/Neglect [facility] Child Abuse Hotline 671-642-8540 [facility] Child Abuse Hotline  731-059-1025 (After Hours)  Emergency Shelter [facility] 747-166-5963  Maternity [facility] [place] [facility] 571-463-1499 [facility] [phone]  MRSA Hotline #:   7823597228    [facility] Resources  [facility]     [employer]                          [facility] Dept. [address] [place] Phone:  [phone]                                   Phone:  [phone]                 Phone:  [place] Phone:  [place] [phone] (After Hours)

## 2011-10-02 NOTE — ED Notes (Signed)
C/o right flank pain, intermittent since Saturday. Non radiating, denies UTI Sx's, no n/v/d. States pain lasts approx 10 min at a time. Presently denies pain.

## 2011-10-02 NOTE — ED Notes (Signed)
Patient transported to CT 

## 2011-10-02 NOTE — ED Notes (Signed)
Patient discharged home with written and verbal instructions.  Denies questions or concerns

## 2011-10-02 NOTE — ED Notes (Signed)
Patient reports onset of pain in his right side since Saturday.  He reports intermittent pain.  He also reports green colored emesis.  Patient is restless due to pain

## 2011-10-03 NOTE — ED Provider Notes (Signed)
Medical screening examination/treatment/procedure(s) were performed by non-physician practitioner and as supervising physician I was immediately available for consultation/collaboration.  Toy Baker, MD 10/03/11 [phone]

## 2011-12-13 ENCOUNTER — Other Ambulatory Visit (HOSPITAL_COMMUNITY): Payer: Self-pay | Admitting: Family Medicine

## 2011-12-13 DIAGNOSIS — R2230 Localized swelling, mass and lump, unspecified upper limb: Secondary | ICD-10-CM

## 2011-12-13 DIAGNOSIS — E669 Obesity, unspecified: Secondary | ICD-10-CM | POA: Diagnosis not present

## 2011-12-13 DIAGNOSIS — J45909 Unspecified asthma, uncomplicated: Secondary | ICD-10-CM | POA: Diagnosis not present

## 2011-12-13 DIAGNOSIS — E785 Hyperlipidemia, unspecified: Secondary | ICD-10-CM | POA: Diagnosis not present

## 2011-12-13 DIAGNOSIS — I10 Essential (primary) hypertension: Secondary | ICD-10-CM | POA: Diagnosis not present

## 2011-12-18 ENCOUNTER — Ambulatory Visit (HOSPITAL_COMMUNITY)
Admission: RE | Admit: 2011-12-18 | Discharge: 2011-12-18 | Disposition: A | Discharge status: Home / Self Care | Payer: Medicare Other | Source: Ambulatory Visit | Attending: Family Medicine | Admitting: Family Medicine

## 2011-12-18 DIAGNOSIS — R229 Localized swelling, mass and lump, unspecified: Secondary | ICD-10-CM | POA: Diagnosis not present

## 2011-12-18 DIAGNOSIS — R29898 Other symptoms and signs involving the musculoskeletal system: Secondary | ICD-10-CM | POA: Diagnosis not present

## 2011-12-18 DIAGNOSIS — R2230 Localized swelling, mass and lump, unspecified upper limb: Secondary | ICD-10-CM

## 2011-12-24 DIAGNOSIS — R0602 Shortness of breath: Secondary | ICD-10-CM | POA: Diagnosis not present

## 2012-01-08 DIAGNOSIS — R0602 Shortness of breath: Secondary | ICD-10-CM | POA: Diagnosis not present

## 2012-01-08 DIAGNOSIS — F39 Unspecified mood [affective] disorder: Secondary | ICD-10-CM | POA: Diagnosis not present

## 2012-02-28 DIAGNOSIS — F39 Unspecified mood [affective] disorder: Secondary | ICD-10-CM | POA: Diagnosis not present

## 2012-07-24 ENCOUNTER — Emergency Department (HOSPITAL_COMMUNITY)
Admission: EM | Admit: 2012-07-24 | Discharge: 2012-07-24 | Disposition: A | Discharge status: Home / Self Care | Payer: Medicare Other | Attending: Emergency Medicine | Admitting: Emergency Medicine

## 2012-07-24 ENCOUNTER — Encounter (HOSPITAL_COMMUNITY): Payer: Self-pay

## 2012-07-24 ENCOUNTER — Emergency Department (HOSPITAL_COMMUNITY): Payer: Medicare Other

## 2012-07-24 DIAGNOSIS — Z79899 Other long term (current) drug therapy: Secondary | ICD-10-CM | POA: Insufficient documentation

## 2012-07-24 DIAGNOSIS — R509 Fever, unspecified: Secondary | ICD-10-CM | POA: Diagnosis not present

## 2012-07-24 DIAGNOSIS — R059 Cough, unspecified: Secondary | ICD-10-CM | POA: Insufficient documentation

## 2012-07-24 DIAGNOSIS — IMO0001 Reserved for inherently not codable concepts without codable children: Secondary | ICD-10-CM | POA: Diagnosis not present

## 2012-07-24 DIAGNOSIS — J069 Acute upper respiratory infection, unspecified: Secondary | ICD-10-CM | POA: Diagnosis not present

## 2012-07-24 DIAGNOSIS — J45909 Unspecified asthma, uncomplicated: Secondary | ICD-10-CM | POA: Diagnosis not present

## 2012-07-24 DIAGNOSIS — R05 Cough: Secondary | ICD-10-CM | POA: Diagnosis not present

## 2012-07-24 DIAGNOSIS — R6883 Chills (without fever): Secondary | ICD-10-CM | POA: Diagnosis not present

## 2012-07-24 DIAGNOSIS — IMO0002 Reserved for concepts with insufficient information to code with codable children: Secondary | ICD-10-CM | POA: Insufficient documentation

## 2012-07-24 DIAGNOSIS — I10 Essential (primary) hypertension: Secondary | ICD-10-CM | POA: Insufficient documentation

## 2012-07-24 DIAGNOSIS — E785 Hyperlipidemia, unspecified: Secondary | ICD-10-CM | POA: Insufficient documentation

## 2012-07-24 DIAGNOSIS — J209 Acute bronchitis, unspecified: Secondary | ICD-10-CM | POA: Diagnosis not present

## 2012-07-24 DIAGNOSIS — J40 Bronchitis, not specified as acute or chronic: Secondary | ICD-10-CM

## 2012-07-24 DIAGNOSIS — R51 Headache: Secondary | ICD-10-CM | POA: Diagnosis not present

## 2012-07-24 MED ORDER — GUAIFENESIN 100 MG/5ML PO SYRP: 100.0000 mg | ORAL_SOLUTION | ORAL | Status: DC | PRN

## 2012-07-24 MED ORDER — AEROCHAMBER PLUS W/MASK MISC
Status: AC
  Filled 2012-07-24: qty 1

## 2012-07-24 MED ORDER — BENZONATATE 100 MG PO CAPS: 100.0000 mg | ORAL_CAPSULE | Freq: Three times a day (TID) | ORAL | Status: DC

## 2012-07-24 MED ORDER — AEROCHAMBER Z-STAT PLUS/MEDIUM MISC
1.0000 | Freq: Once | Status: AC
  Administered 2012-07-24: 1

## 2012-07-24 MED ORDER — ALBUTEROL SULFATE HFA 108 (90 BASE) MCG/ACT IN AERS
2.0000 | INHALATION_SPRAY | Freq: Once | RESPIRATORY_TRACT | Status: AC
  Administered 2012-07-24: 2 via RESPIRATORY_TRACT
  Filled 2012-07-24: qty 6.7

## 2012-07-24 MED ORDER — ACETAMINOPHEN 325 MG PO TABS
650.0000 mg | ORAL_TABLET | Freq: Once | ORAL | Status: AC
  Administered 2012-07-24: 650 mg via ORAL
  Filled 2012-07-24: qty 2

## 2012-07-24 MED ORDER — CETIRIZINE-PSEUDOEPHEDRINE ER 5-120 MG PO TB12: 1.0000 | ORAL_TABLET | Freq: Every day | ORAL | Status: DC

## 2012-07-24 NOTE — ED Provider Notes (Signed)
History     CSN: [id number]  Arrival date & time 07/24/12  1232   First MD Initiated Contact with Patient 07/24/12 1244      No chief complaint on file.   (Consider location/radiation/quality/duration/timing/severity/associated sxs/prior treatment) HPI Roberto Horne is a 52 y.o. male who presents with complaints of chills, body aches, nasal congestion, cough since last night. States did not take any medications. States his mother and sister who he lives with sick with same symptoms. Did not take his temperature. No chest pain. No shortness of breath. No neck pain or stiffness. No n/v/d. No abdominal pain.    Past Medical History  Diagnosis Date  . Hypertension   . Asthma   . Hyperlipemia     History reviewed. No pertinent past surgical history.  History reviewed. No pertinent family history.  History  Substance Use Topics  . Smoking status: Never Smoker   . Smokeless tobacco: Not on file  . Alcohol Use: No      Review of Systems  Constitutional: Negative for fever and chills.  HENT: Positive for congestion and rhinorrhea. Negative for sore throat, neck pain and neck stiffness.   Respiratory: Positive for cough. Negative for chest tightness and shortness of breath.   Cardiovascular: Negative.   Gastrointestinal: Negative.   Genitourinary: Negative for dysuria.  Musculoskeletal: Positive for myalgias.  Neurological: Positive for headaches. Negative for weakness, light-headedness and numbness.    Allergies  Ibuprofen  Home Medications   Current Outpatient Rx  Name  Route  Sig  Dispense  Refill  . FLUTICASONE-SALMETEROL 115-21 MCG/ACT IN AERO   Inhalation   Inhale 2 puffs into the lungs 2 (two) times daily.         Marland Kitchen MONTELUKAST SODIUM 10 MG PO TABS   Oral   Take 10 mg by mouth at bedtime.         Marland Kitchen NIFEDIPINE ER 30 MG PO TB24   Oral   Take 30 mg by mouth daily.         Marland Kitchen OMEPRAZOLE 20 MG PO CPDR   Oral   Take 20 mg by mouth 2 (two) times daily.           Marland Kitchen PRAVASTATIN SODIUM 20 MG PO TABS   Oral   Take 20 mg by mouth every evening.           BP 113/73  Pulse 97  Temp 97.5 F (36.4 C) (Oral)  Resp 20  SpO2 96%  Physical Exam  Nursing note and vitals reviewed. Constitutional: He is oriented to person, place, and time. He appears well-developed and well-nourished. No distress.  HENT:  Head: Normocephalic and atraumatic.  Right Ear: Tympanic membrane, external ear and ear canal normal.  Left Ear: Tympanic membrane, external ear and ear canal normal.  Nose: Rhinorrhea present.  Mouth/Throat: Uvula is midline, oropharynx is clear and moist and mucous membranes are normal.  Eyes: Conjunctivae normal are normal.  Neck: Neck supple.  Cardiovascular: Normal rate, regular rhythm and normal heart sounds.   Pulmonary/Chest: Effort normal. No respiratory distress. He has wheezes. He has no rales.       Diffuse expiratory wheezes  Musculoskeletal: He exhibits no edema.  Neurological: He is alert and oriented to person, place, and time.  Skin: Skin is warm and dry.    ED Course  Procedures (including critical care time)  Dg Chest 2 View  07/24/2012  *RADIOLOGY REPORT*  Clinical Data: Cough.  Fever.  CHEST - 2 VIEW  Comparison: 03/20/2010.  Findings:  Cardiopericardial silhouette and mediastinal contours are within normal limits.  No airspace disease.  No pleural effusion.  Resolution of previously seen left perihilar infiltrate (2011).  IMPRESSION: No active cardiopulmonary disease.   Original Report Authenticated By: Andreas Newport, M.D.       1. Bronchitis   2. URI (upper respiratory infection)       MDM  Pt with URI symptoms onset last night. He is non toxic appearing. Inhaler given for wheezing. CXR negative. He is afebrile. Suspect viral URI. Will treat symptomatically with close follow up.    Filed Vitals:   07/24/12 1237  BP: 113/73  Pulse: 97  Temp: 97.5 F (36.4 C)  Resp: [address]  Shareef Eddinger, [place] 07/24/12 [phone]

## 2012-07-24 NOTE — ED Notes (Signed)
Pt presents with reported fever x 2 days.  Pt reports dry cough, nasal congestion and chills.  Mother at home with same symptoms.

## 2012-07-25 NOTE — ED Provider Notes (Signed)
Medical screening examination/treatment/procedure(s) were performed by non-physician practitioner and as supervising physician I was immediately available for consultation/collaboration.   Kiante Petrovich, MD 07/25/12 0908 

## 2012-09-24 DIAGNOSIS — E785 Hyperlipidemia, unspecified: Secondary | ICD-10-CM | POA: Diagnosis not present

## 2012-09-24 DIAGNOSIS — E669 Obesity, unspecified: Secondary | ICD-10-CM | POA: Diagnosis not present

## 2012-09-24 DIAGNOSIS — J45909 Unspecified asthma, uncomplicated: Secondary | ICD-10-CM | POA: Diagnosis not present

## 2012-09-24 DIAGNOSIS — K219 Gastro-esophageal reflux disease without esophagitis: Secondary | ICD-10-CM | POA: Diagnosis not present

## 2012-11-04 ENCOUNTER — Encounter (HOSPITAL_COMMUNITY): Payer: Self-pay

## 2012-11-04 ENCOUNTER — Emergency Department (HOSPITAL_COMMUNITY): Payer: Medicare Other

## 2012-11-04 ENCOUNTER — Emergency Department (HOSPITAL_COMMUNITY)
Admission: EM | Admit: 2012-11-04 | Discharge: 2012-11-04 | Disposition: A | Discharge status: Home / Self Care | Payer: Medicare Other | Source: Home / Self Care | Attending: Emergency Medicine | Admitting: Emergency Medicine

## 2012-11-04 DIAGNOSIS — M109 Gout, unspecified: Secondary | ICD-10-CM | POA: Diagnosis present

## 2012-11-04 DIAGNOSIS — Z79899 Other long term (current) drug therapy: Secondary | ICD-10-CM | POA: Insufficient documentation

## 2012-11-04 DIAGNOSIS — IMO0002 Reserved for concepts with insufficient information to code with codable children: Secondary | ICD-10-CM | POA: Insufficient documentation

## 2012-11-04 DIAGNOSIS — E785 Hyperlipidemia, unspecified: Secondary | ICD-10-CM | POA: Diagnosis not present

## 2012-11-04 DIAGNOSIS — S93602A Unspecified sprain of left foot, initial encounter: Secondary | ICD-10-CM

## 2012-11-04 DIAGNOSIS — X500XXA Overexertion from strenuous movement or load, initial encounter: Secondary | ICD-10-CM | POA: Insufficient documentation

## 2012-11-04 DIAGNOSIS — Z886 Allergy status to analgesic agent status: Secondary | ICD-10-CM | POA: Diagnosis not present

## 2012-11-04 DIAGNOSIS — I10 Essential (primary) hypertension: Secondary | ICD-10-CM | POA: Diagnosis not present

## 2012-11-04 DIAGNOSIS — Y929 Unspecified place or not applicable: Secondary | ICD-10-CM | POA: Insufficient documentation

## 2012-11-04 DIAGNOSIS — G3184 Mild cognitive impairment, so stated: Secondary | ICD-10-CM | POA: Diagnosis not present

## 2012-11-04 DIAGNOSIS — J45909 Unspecified asthma, uncomplicated: Secondary | ICD-10-CM | POA: Insufficient documentation

## 2012-11-04 DIAGNOSIS — R5381 Other malaise: Secondary | ICD-10-CM | POA: Diagnosis not present

## 2012-11-04 DIAGNOSIS — S93609A Unspecified sprain of unspecified foot, initial encounter: Secondary | ICD-10-CM | POA: Insufficient documentation

## 2012-11-04 DIAGNOSIS — M79609 Pain in unspecified limb: Secondary | ICD-10-CM | POA: Diagnosis not present

## 2012-11-04 DIAGNOSIS — R6510 Systemic inflammatory response syndrome (SIRS) of non-infectious origin without acute organ dysfunction: Secondary | ICD-10-CM | POA: Diagnosis not present

## 2012-11-04 DIAGNOSIS — Y9389 Activity, other specified: Secondary | ICD-10-CM | POA: Insufficient documentation

## 2012-11-04 DIAGNOSIS — R000 Tachycardia, unspecified: Secondary | ICD-10-CM | POA: Diagnosis not present

## 2012-11-04 DIAGNOSIS — R6889 Other general symptoms and signs: Secondary | ICD-10-CM | POA: Diagnosis not present

## 2012-11-04 DIAGNOSIS — M7989 Other specified soft tissue disorders: Secondary | ICD-10-CM | POA: Diagnosis not present

## 2012-11-04 DIAGNOSIS — S93409A Sprain of unspecified ligament of unspecified ankle, initial encounter: Secondary | ICD-10-CM | POA: Diagnosis not present

## 2012-11-04 DIAGNOSIS — M25569 Pain in unspecified knee: Secondary | ICD-10-CM | POA: Diagnosis not present

## 2012-11-04 DIAGNOSIS — Z23 Encounter for immunization: Secondary | ICD-10-CM | POA: Diagnosis not present

## 2012-11-04 DIAGNOSIS — J189 Pneumonia, unspecified organism: Secondary | ICD-10-CM | POA: Diagnosis not present

## 2012-11-04 DIAGNOSIS — M129 Arthropathy, unspecified: Secondary | ICD-10-CM | POA: Diagnosis not present

## 2012-11-04 DIAGNOSIS — R059 Cough, unspecified: Secondary | ICD-10-CM | POA: Insufficient documentation

## 2012-11-04 DIAGNOSIS — M6281 Muscle weakness (generalized): Secondary | ICD-10-CM | POA: Diagnosis not present

## 2012-11-04 DIAGNOSIS — M25579 Pain in unspecified ankle and joints of unspecified foot: Secondary | ICD-10-CM | POA: Diagnosis not present

## 2012-11-04 DIAGNOSIS — R0682 Tachypnea, not elsewhere classified: Secondary | ICD-10-CM | POA: Diagnosis not present

## 2012-11-04 DIAGNOSIS — J069 Acute upper respiratory infection, unspecified: Secondary | ICD-10-CM | POA: Insufficient documentation

## 2012-11-04 DIAGNOSIS — R05 Cough: Secondary | ICD-10-CM | POA: Insufficient documentation

## 2012-11-04 DIAGNOSIS — A419 Sepsis, unspecified organism: Secondary | ICD-10-CM | POA: Diagnosis not present

## 2012-11-04 DIAGNOSIS — R509 Fever, unspecified: Secondary | ICD-10-CM | POA: Diagnosis not present

## 2012-11-04 MED ORDER — HYDROCODONE-ACETAMINOPHEN 5-325 MG PO TABS
1.0000 | ORAL_TABLET | Freq: Once | ORAL | Status: AC
  Administered 2012-11-04: 1 via ORAL
  Filled 2012-11-04: qty 1

## 2012-11-04 MED ORDER — HYDROCODONE-ACETAMINOPHEN 5-325 MG PO TABS: 1.0000 | ORAL_TABLET | ORAL | Status: DC | PRN

## 2012-11-04 NOTE — ED Provider Notes (Signed)
History     CSN: [id number]  Arrival date & time 11/04/12  4098   First MD Initiated Contact with Patient 11/04/12 0901      Chief Complaint  Patient presents with  . Nasal Congestion  . Ankle Pain    (Consider location/radiation/quality/duration/timing/severity/associated sxs/prior treatment) HPI Patient complains of left foot pain, nonradiating for one month. Patient thinks he twisted his left foot one month ago. Pain is worse with weightbearing not improved by anything he is no treatment prior to coming here. He denies ankle pain denies other joint pain. He also complains of nasal congestion which started approximately 5 days ago. Symptoms accompanied by sneeze occasional cough no fever no dyspnea no other complaint no other associated symptoms. No treatment prior to coming here Past Medical History  Diagnosis Date  . Hypertension   . Asthma   . Hyperlipemia     History reviewed. No pertinent past surgical history.  No family history on file.  History  Substance Use Topics  . Smoking status: Never Smoker   . Smokeless tobacco: Not on file  . Alcohol Use: No      Review of Systems  Constitutional: Negative.   HENT: Positive for congestion, rhinorrhea and sneezing.   Respiratory: Positive for cough.   Cardiovascular: Negative.   Gastrointestinal: Negative.   Musculoskeletal: Positive for arthralgias.       Left foot pain  Skin: Negative.   Allergic/Immunologic: Negative for immunocompromised state.  Neurological: Negative.   Psychiatric/Behavioral: Negative.   All other systems reviewed and are negative.    Allergies  Ibuprofen  Home Medications   Current Outpatient Rx  Name  Route  Sig  Dispense  Refill  . benzonatate (TESSALON) 100 MG capsule   Oral   Take 1 capsule (100 mg total) by mouth every 8 (eight) hours.   21 capsule   0   . cetirizine-pseudoephedrine (ZYRTEC-D) 5-120 MG per tablet   Oral   Take 1 tablet by mouth daily.   30 tablet    0   . fluticasone-salmeterol (ADVAIR HFA) 115-21 MCG/ACT inhaler   Inhalation   Inhale 2 puffs into the lungs 2 (two) times daily.         Marland Kitchen guaifenesin (ROBITUSSIN) 100 MG/5ML syrup   Oral   Take 5-10 mLs (100-200 mg total) by mouth every 4 (four) hours as needed for cough.   60 mL   0   . montelukast (SINGULAIR) 10 MG tablet   Oral   Take 10 mg by mouth at bedtime.         Marland Kitchen NIFEdipine (PROCARDIA-XL/ADALAT CC) 30 MG 24 hr tablet   Oral   Take 30 mg by mouth daily.         Marland Kitchen omeprazole (PRILOSEC) 20 MG capsule   Oral   Take 20 mg by mouth 2 (two) times daily.          . pravastatin (PRAVACHOL) 20 MG tablet   Oral   Take 20 mg by mouth every evening.           BP 121/95  Pulse 61  Temp(Src) 98.4 F (36.9 C) (Oral)  Resp 18  Ht 6\' 4"  (1.93 m)  SpO2 94%  Physical Exam  Nursing note and vitals reviewed. Constitutional: He appears well-developed and well-nourished.  HENT:  Head: Normocephalic and atraumatic.  Right Ear: External ear normal.  Left Ear: External ear normal.  Mouth/Throat: Oropharynx is clear and moist. No oropharyngeal exudate.  Nose with yellowish  discharge  Eyes: Conjunctivae are normal. Pupils are equal, round, and reactive to light.  Neck: Neck supple. No tracheal deviation present. No thyromegaly present.  Cardiovascular: Normal rate and regular rhythm.   No murmur heard. Pulmonary/Chest: Effort normal and breath sounds normal.  Abdominal: Soft. Bowel sounds are normal. He exhibits no distension. There is no tenderness.  Morbidly obese  Musculoskeletal: Normal range of motion. He exhibits no edema and no tenderness.  Left lower extremities skin intact warm or red foot is diffusely swollen and tender DP pulse 2+ capillary refill. All other extremities or redness swelling or tenderness neurovascularly intact  Neurological: He is alert. Coordination normal.  Skin: Skin is warm and dry. No rash noted.  Psychiatric: He has a normal mood  and affect.    ED Course  Procedures (including critical care time)  Labs Reviewed - No data to display No results found.  Results for orders placed during the hospital encounter of 10/02/11  CBC      Result Value Range   WBC 11.9 (*) 4.0 - 10.5 K/uL   RBC 5.54  4.22 - 5.81 MIL/uL   Hemoglobin 13.5  13.0 - 17.0 g/dL   HCT 16.1  09.6 - 04.5 %   MCV 76.2 (*) 78.0 - 100.0 fL   MCH 24.4 (*) 26.0 - 34.0 pg   MCHC 32.0  30.0 - 36.0 g/dL   RDW 40.9  81.1 - 91.4 %   Platelets 156  150 - 400 K/uL  DIFFERENTIAL      Result Value Range   Neutrophils Relative 76  43 - 77 %   Neutro Abs 9.0 (*) 1.7 - 7.7 K/uL   Lymphocytes Relative 12  12 - 46 %   Lymphs Abs 1.5  0.7 - 4.0 K/uL   Monocytes Relative 11  3 - 12 %   Monocytes Absolute 1.3 (*) 0.1 - 1.0 K/uL   Eosinophils Relative 2  0 - 5 %   Eosinophils Absolute 0.2  0.0 - 0.7 K/uL   Basophils Relative 0  0 - 1 %   Basophils Absolute 0.0  0.0 - 0.1 K/uL  BASIC METABOLIC PANEL      Result Value Range   Sodium 139  135 - 145 mEq/L   Potassium 4.2  3.5 - 5.1 mEq/L   Chloride 101  96 - 112 mEq/L   CO2 29  19 - 32 mEq/L   Glucose, Bld 98  70 - 99 mg/dL   BUN 8  6 - 23 mg/dL   Creatinine, Ser 7.82  0.50 - 1.35 mg/dL   Calcium 9.3  8.4 - 95.6 mg/dL   GFR calc non Af Amer >90  >90 mL/min   GFR calc Af Amer >90  >90 mL/min   Dg Foot Complete Left  11/04/2012  *RADIOLOGY REPORT*  Clinical Data: Left foot pain without known injury.  LEFT FOOT - COMPLETE 3+ VIEW  Comparison: None.  Findings: Soft tissues of the foot appear swollen.  No bony or joint abnormality is identified.  No radiopaque foreign body is seen.  IMPRESSION: Soft tissue swelling.  Otherwise negative.   Original Report Authenticated By: Holley Dexter, M.D.     No diagnosis found. X-ray reviewed by me 11:15 AM Pain improved after treatment with Norco. Patient comfortable in postop shoe.   MDM  Plan saline nasal spray. Prescription Norco . Orthopedic referral  Dr.Yates Diagnosis #1 upper respiratory infection #2 left foot sprain        Anneta Rounds  Ethelda Chick, MD 11/04/12 1119

## 2012-11-04 NOTE — ED Notes (Signed)
Pt presents with nasal congestion that started last week. Pt has nasal drainage that is green in color. Pt also c/o left ankle pain that has been going on for about a week or so. Pt is having trouble ambulating and does not remember injuring his leg.

## 2012-11-06 ENCOUNTER — Emergency Department (HOSPITAL_COMMUNITY): Payer: Medicare Other

## 2012-11-06 ENCOUNTER — Inpatient Hospital Stay (HOSPITAL_COMMUNITY)
Admission: EM | Admit: 2012-11-06 | Discharge: 2012-11-10 | DRG: 195 | Disposition: A | Discharge status: Skilled Nursing Facility | Payer: Medicare Other | Attending: Internal Medicine | Admitting: Internal Medicine

## 2012-11-06 ENCOUNTER — Encounter (HOSPITAL_COMMUNITY): Payer: Self-pay | Admitting: Emergency Medicine

## 2012-11-06 DIAGNOSIS — R6510 Systemic inflammatory response syndrome (SIRS) of non-infectious origin without acute organ dysfunction: Secondary | ICD-10-CM | POA: Diagnosis not present

## 2012-11-06 DIAGNOSIS — R6889 Other general symptoms and signs: Secondary | ICD-10-CM | POA: Diagnosis not present

## 2012-11-06 DIAGNOSIS — I10 Essential (primary) hypertension: Secondary | ICD-10-CM | POA: Diagnosis not present

## 2012-11-06 DIAGNOSIS — A419 Sepsis, unspecified organism: Secondary | ICD-10-CM

## 2012-11-06 DIAGNOSIS — R531 Weakness: Secondary | ICD-10-CM

## 2012-11-06 DIAGNOSIS — J189 Pneumonia, unspecified organism: Secondary | ICD-10-CM | POA: Diagnosis not present

## 2012-11-06 DIAGNOSIS — R509 Fever, unspecified: Secondary | ICD-10-CM

## 2012-11-06 DIAGNOSIS — Z23 Encounter for immunization: Secondary | ICD-10-CM

## 2012-11-06 DIAGNOSIS — R0682 Tachypnea, not elsewhere classified: Secondary | ICD-10-CM | POA: Diagnosis not present

## 2012-11-06 DIAGNOSIS — M109 Gout, unspecified: Secondary | ICD-10-CM | POA: Diagnosis present

## 2012-11-06 DIAGNOSIS — Z886 Allergy status to analgesic agent status: Secondary | ICD-10-CM

## 2012-11-06 DIAGNOSIS — Z79899 Other long term (current) drug therapy: Secondary | ICD-10-CM

## 2012-11-06 DIAGNOSIS — J45909 Unspecified asthma, uncomplicated: Secondary | ICD-10-CM | POA: Diagnosis present

## 2012-11-06 DIAGNOSIS — E785 Hyperlipidemia, unspecified: Secondary | ICD-10-CM | POA: Diagnosis present

## 2012-11-06 DIAGNOSIS — M25579 Pain in unspecified ankle and joints of unspecified foot: Secondary | ICD-10-CM | POA: Diagnosis not present

## 2012-11-06 DIAGNOSIS — M25569 Pain in unspecified knee: Secondary | ICD-10-CM | POA: Diagnosis not present

## 2012-11-06 DIAGNOSIS — M1710 Unilateral primary osteoarthritis, unspecified knee: Secondary | ICD-10-CM | POA: Diagnosis not present

## 2012-11-06 DIAGNOSIS — R000 Tachycardia, unspecified: Secondary | ICD-10-CM

## 2012-11-06 DIAGNOSIS — R5381 Other malaise: Secondary | ICD-10-CM | POA: Diagnosis not present

## 2012-11-06 LAB — CBC WITH DIFFERENTIAL/PLATELET
Eosinophils Absolute: 0 10*3/uL (ref 0.0–0.7)
Hemoglobin: 15.5 g/dL (ref 13.0–17.0)
Lymphocytes Relative: 13 % (ref 12–46)
Lymphs Abs: 1.8 10*3/uL (ref 0.7–4.0)
MCH: 25.4 pg — ABNORMAL LOW (ref 26.0–34.0)
Monocytes Relative: 14 % — ABNORMAL HIGH (ref 3–12)
Neutro Abs: 10 10*3/uL — ABNORMAL HIGH (ref 1.7–7.7)
Neutrophils Relative %: 73 % (ref 43–77)
RBC: 6.11 MIL/uL — ABNORMAL HIGH (ref 4.22–5.81)
WBC: 13.6 10*3/uL — ABNORMAL HIGH (ref 4.0–10.5)

## 2012-11-06 LAB — BASIC METABOLIC PANEL
BUN: 21 mg/dL (ref 6–23)
CO2: 30 mEq/L (ref 19–32)
Chloride: 97 mEq/L (ref 96–112)
Glucose, Bld: 120 mg/dL — ABNORMAL HIGH (ref 70–99)
Potassium: 4.2 mEq/L (ref 3.5–5.1)

## 2012-11-06 LAB — URINE MICROSCOPIC-ADD ON

## 2012-11-06 LAB — PROCALCITONIN: Procalcitonin: 0.28 ng/mL

## 2012-11-06 LAB — URINALYSIS, ROUTINE W REFLEX MICROSCOPIC
Bilirubin Urine: NEGATIVE
Glucose, UA: NEGATIVE mg/dL
Hgb urine dipstick: NEGATIVE
Specific Gravity, Urine: 1.026 (ref 1.005–1.030)
pH: 5 (ref 5.0–8.0)

## 2012-11-06 MED ORDER — DEXTROSE 5 % IV SOLN
2.0000 g | Freq: Once | INTRAVENOUS | Status: AC
  Administered 2012-11-06: 2 g via INTRAVENOUS
  Filled 2012-11-06: qty 2

## 2012-11-06 MED ORDER — SODIUM CHLORIDE 0.9 % IV BOLUS (SEPSIS)
1000.0000 mL | Freq: Once | INTRAVENOUS | Status: AC
  Administered 2012-11-06: 1000 mL via INTRAVENOUS

## 2012-11-06 MED ORDER — VANCOMYCIN HCL IN DEXTROSE 1-5 GM/200ML-% IV SOLN
1000.0000 mg | Freq: Once | INTRAVENOUS | Status: AC
  Administered 2012-11-06: 1000 mg via INTRAVENOUS
  Filled 2012-11-06: qty 200

## 2012-11-06 MED ORDER — HYDROCODONE-ACETAMINOPHEN 5-325 MG PO TABS
2.0000 | ORAL_TABLET | Freq: Once | ORAL | Status: AC
  Administered 2012-11-06: 2 via ORAL
  Filled 2012-11-06: qty 2

## 2012-11-06 MED ORDER — ACETAMINOPHEN 325 MG PO TABS
650.0000 mg | ORAL_TABLET | Freq: Once | ORAL | Status: AC
  Administered 2012-11-06: 650 mg via ORAL
  Filled 2012-11-06: qty 2

## 2012-11-06 MED ORDER — SODIUM CHLORIDE 0.9 % IV BOLUS (SEPSIS)
1000.0000 mL | INTRAVENOUS | Status: AC
  Administered 2012-11-06: 1000 mL via INTRAVENOUS

## 2012-11-06 NOTE — ED Provider Notes (Signed)
History     CSN: [id number]  Arrival date & time 11/06/12  1818   First MD Initiated Contact with Patient 11/06/12 1819      Chief Complaint  Patient presents with  . Ankle Pain  . Knee Pain    (Consider location/radiation/quality/duration/timing/severity/associated sxs/prior treatment) Patient is a 52 y.o. male presenting with ankle pain and knee pain. The history is provided by the patient and medical records. No language interpreter was used.  Ankle Pain Location:  Foot and knee Time since incident:  1 week Injury: yes   Mechanism of injury: fall   Fall:    Fall occurred: while playing basketball.   Height of fall:  Ground level   Impact surface:  Athletic surface Knee location:  L knee Foot location:  L foot Chronicity:  New Dislocation: no   Foreign body present:  No foreign bodies Tetanus status:  Unknown Prior injury to area:  No Relieved by:  Nothing Worsened by:  Activity and bearing weight Ineffective treatments:  NSAIDs and rest Associated symptoms: stiffness and swelling   Associated symptoms: no back pain, no decreased ROM, no fatigue, no fever, no itching, no muscle weakness, no neck pain, no numbness and no tingling   Risk factors: obesity   Knee Pain Associated symptoms: stiffness and swelling   Associated symptoms: no back pain, no decreased ROM, no fatigue, no fever, no itching, no muscle weakness, no neck pain, no numbness and no tingling     Roberto Horne is a 52 y.o. male  with a hx of HTN, asthma, hyperlipidema presents to the Emergency Department complaining of gradual, persistent, left leg and knee pain beginning > 1 week ago after a fall.  Pt evaluated on 11/04/12 for pain and swelling in the extremity and found to be without fracture in the foot.  Pt with persistent pain and swelling causing him to be unable to ambulate.  Pt found by EMS sitting in his own urine and feces and stating that he has been there for several days.  Pt lives at home with  mother who is in her 10's and unable to assist him with his ADLs.  Pt states he was ambulatory and able to care for himself before the fall. Associated symptoms include pain, gait disturbance 2/2 pain and swelling in the leg.  Nothing makes it better and attempting to walk makes it worse.  Pt denies fever, chills, headache, chest pain, SOB, abd pain, N/V/D, weakness dizziness, dysuria, hematuria.     Past Medical History  Diagnosis Date  . Hypertension   . Asthma   . Hyperlipemia     History reviewed. No pertinent past surgical history.  History reviewed. No pertinent family history.  History  Substance Use Topics  . Smoking status: Never Smoker   . Smokeless tobacco: Not on file  . Alcohol Use: No      Review of Systems  Constitutional: Negative for fever and fatigue.  HENT: Negative for neck pain and neck stiffness.   Respiratory: Negative for cough and shortness of breath.   Cardiovascular: Positive for leg swelling (L leg). Negative for chest pain and palpitations.  Gastrointestinal: Negative for nausea, vomiting and abdominal pain.  Endocrine: Negative for polydipsia, polyphagia and polyuria.  Genitourinary: Negative for dysuria and hematuria.  Musculoskeletal: Positive for joint swelling, arthralgias, gait problem (2/2 pain) and stiffness. Negative for back pain.  Skin: Positive for color change (redness). Negative for itching and wound.  Allergic/Immunologic: Negative for immunocompromised state.  Neurological:  Negative for numbness.  Hematological: Does not bruise/bleed easily.  Psychiatric/Behavioral: The patient is not nervous/anxious.   All other systems reviewed and are negative.    Allergies  Ibuprofen  Home Medications   Current Outpatient Rx  Name  Route  Sig  Dispense  Refill  . fluticasone-salmeterol (ADVAIR HFA) 115-21 MCG/ACT inhaler   Inhalation   Inhale 2 puffs into the lungs 2 (two) times daily.         Marland Kitchen HYDROcodone-acetaminophen  (NORCO/VICODIN) 5-325 MG per tablet   Oral   Take 1 tablet by mouth every 4 (four) hours as needed for pain.   10 tablet   0   . montelukast (SINGULAIR) 10 MG tablet   Oral   Take 10 mg by mouth at bedtime.         Marland Kitchen NIFEdipine (PROCARDIA-XL/ADALAT CC) 30 MG 24 hr tablet   Oral   Take 30 mg by mouth daily.         Marland Kitchen omeprazole (PRILOSEC) 20 MG capsule   Oral   Take 20 mg by mouth 2 (two) times daily.            BP 104/75  Pulse 97  Temp(Src) 100.5 F (38.1 C) (Rectal)  Resp 12  Ht 6\' 2"  (1.88 m)  SpO2 96%  Physical Exam  Nursing note and vitals reviewed. Constitutional: He is oriented to person, place, and time. He appears well-developed and well-nourished. No distress.  HENT:  Head: Normocephalic and atraumatic.  Mouth/Throat: Oropharynx is clear and moist.  Eyes: Conjunctivae and EOM are normal. Pupils are equal, round, and reactive to light.  Neck: Normal range of motion.  Cardiovascular: Normal rate, regular rhythm, normal heart sounds and intact distal pulses.   No murmur heard. Capillary refill < 3 seconds  Pulmonary/Chest: No accessory muscle usage. Tachypnea noted. He has decreased breath sounds (Throughout). He has no wheezes. He has no rhonchi. He has no rales.  Abdominal: Soft. Bowel sounds are normal. He exhibits no distension. There is no tenderness. There is no rebound and no guarding.  obese  Musculoskeletal: He exhibits edema and tenderness.       Left knee: He exhibits decreased range of motion and swelling. He exhibits no effusion, no ecchymosis, no deformity, no laceration, no erythema and normal alignment. Tenderness (generalized) found.       Left ankle: He exhibits decreased range of motion and swelling. He exhibits no ecchymosis, no deformity, no laceration and normal pulse. Tenderness (generalized).       Left foot: He exhibits decreased range of motion, tenderness (medial side) and swelling. He exhibits no bony tenderness, normal capillary  refill, no crepitus, no deformity and no laceration.       Feet:  ROM: decreased ROM of the left knee, ankle and foot 2/2 to pain and swelling; extremity warm to the touch with nonpitting edema Pt unable to ambulate 2/2 pain  Lymphadenopathy:    He has no cervical adenopathy.  Neurological: He is alert and oriented to person, place, and time. Coordination normal. GCS eye subscore is 4. GCS verbal subscore is 5. GCS motor subscore is 6.  Sensation intact to dull and sharp Strength decreased to 3/5 in the LLE 2/2 to poor effort and pain  Skin: Skin is warm and dry. He is not diaphoretic. There is erythema.  No tenting of the skin Erythema of the L foot without induration, fluctuance or gross abscess  Psychiatric: He has a normal mood and affect.  ED Course  Procedures (including critical care time)  Labs Reviewed  CBC WITH DIFFERENTIAL - Abnormal; Notable for the following:    WBC 13.6 (*)    RBC 6.11 (*)    MCV 74.5 (*)    MCH 25.4 (*)    Neutro Abs 10.0 (*)    Monocytes Relative 14 (*)    Monocytes Absolute 1.9 (*)    All other components within normal limits  BASIC METABOLIC PANEL - Abnormal; Notable for the following:    Glucose, Bld 120 (*)    GFR calc non Af Amer 71 (*)    GFR calc Af Amer 82 (*)    All other components within normal limits  URINALYSIS, ROUTINE W REFLEX MICROSCOPIC - Abnormal; Notable for the following:    Ketones, ur 15 (*)    Protein, ur 30 (*)    All other components within normal limits  URINE MICROSCOPIC-ADD ON - Abnormal; Notable for the following:    Squamous Epithelial / LPF FEW (*)    Bacteria, UA MANY (*)    All other components within normal limits  CULTURE, BLOOD (ROUTINE X 2)  CULTURE, BLOOD (ROUTINE X 2)  URINE CULTURE  PROCALCITONIN  CG4 I-STAT (LACTIC ACID)   Dg Chest 2 View  11/06/2012   *RADIOLOGY REPORT*  Clinical Data: Fever, tachycardia, tachypnea  CHEST - 2 VIEW  Comparison: 07/24/2012  Findings: Heart size upper normal to  mildly enlarged.  Central vascular congestion.  Mild increased perihilar markings and peribronchial thickening.  Retrocardiac opacity.  No pneumothorax. No acute osseous finding.  IMPRESSION: Retrocardiac opacity is concerning for pneumonia given the clinical presentation.  Increased perihilar markings and peribronchial thickening may reflect bronchitic change versus mild edema.   Original Report Authenticated By: Jearld Lesch, M.D.   Dg Knee Complete 4 Views Left  11/06/2012   *RADIOLOGY REPORT*  Clinical Data: Status post fall Oct 31, 2011.  Left knee pain.  LEFT KNEE - COMPLETE 4+ VIEW  Comparison: Plain films 02/21/2009.  Findings: No acute bony or joint abnormality is identified.  There is degenerative change about the knee most notable in the medial compartment.  Small joint effusion is seen.  IMPRESSION: No acute finding.  Degenerative change of the medial compartment.   Original Report Authenticated By: Holley Dexter, M.D.     1. Community acquired pneumonia   2. Generalized weakness   3. Fever   4. Tachycardia   5. Sepsis   6. HTN (hypertension)   7. Hyperlipidemia       MDM  Isabel Caprice presents with extremity pain and swelling after fall with pain causing immobility and poor living conditions prompting his ER visit via EMS. Patient X-Ray negative for obvious fracture or dislocation. Pain managed in ED.   While here in the emergency department it is noted the patient is tachycardic and febrile as well as tachypneic. His urine is malodorous and there is concern that he has an underlying infection causing his increased weakness and inability to walk in addition to his leg pain.  CBC with mild leukocytosis of 13.6 and chest x-ray with retrocardiac opacity concerning for pneumonia.  I personally reviewed the imaging tests through PACS system.  I reviewed available ER/hospitalization records through the EMR.    Patient persistently tachycardic after 1 L of fluid however without  hypertension.  Urinalysis and lactic acid pending.  Blood cultures ordered in addition to antibiotics. Will proceed with admission.  UA without evidence of UTI.  Dahlia Client Maciel Kegg, PA-C 11/06/12 2334  Dierdre Forth, PA-C 11/06/12 [phone]

## 2012-11-06 NOTE — ED Notes (Signed)
Patient in route to Radiology.

## 2012-11-06 NOTE — ED Notes (Signed)
Urinal at bedside; patient informed of order for in and out cath if unable to provide urine sample; pt sts he would rather try to use urinal first

## 2012-11-06 NOTE — ED Notes (Signed)
Chart reviewed.  Pt with Medicare/Medicaid and requires 3 midnight qualifying stay for NHP.  Per midlevel, pt may be appropriate for ALF placement, which can be done from pt's home.  CSW recommending maximizing HHC services for pt (PT,OT,RN, CNA) and adding a HHSW to assist pt and family with ALF placement.  Face to face complete and will inform ED RN CM to f/u with pt's HHC orders in the am.

## 2012-11-06 NOTE — ED Notes (Signed)
Pt's mother Inez Pilgrim at [phone] and paramedic Enid Cutter at [phone] (brought pt ED and know the family well and willing to help).

## 2012-11-06 NOTE — ED Notes (Signed)
Rapid Response RN at Bedside.

## 2012-11-06 NOTE — H&P (Signed)
[facility] History and Physical  Mitesh Rosendahl ZOX:[id number] DOB: Apr 24, 1961    PCP:   Jaclyn Shaggy, MD   Chief Complaint: left foot and left ankle pain.  HPI: Roberto Horne is an 52 y.o. male with hx of HTN, hyperlipidemia, asthma, presents to the ER via EMS as left his ankle and his left foot have been more painful.  He said he was playing basket ball and another player sat on it.  He was evaluated with xray via his PCP and no Fx was seen.  EMS personnel also commented that his living condition at home was suboptimal.  He lives with his mother.  Social service was consulted in the ER, and initiated home health aid upon discharge.  Further evaluation included a CXR which showed possible PNA in the retrocardiac region, with some vascular congestion. He has leukocytosis with WBC of 13K, and normal renal fx tests. He admitted to having green productive coughs for the past few days.  His left ankle and toe are swollen and red, no calf tenderness.  X ray of the left knee was negative.  Hospitalist was asked to admitted him for PNA.  Rewiew of Systems:  Constitutional: Negative for malaise, fever and chills. No significant weight loss or weight gain Eyes: Negative for eye pain, redness and discharge, diplopia, visual changes, or flashes of light. ENMT: Negative for ear pain, hoarseness, nasal congestion, sinus pressure and sore throat. No headaches; tinnitus, drooling, or problem swallowing. Cardiovascular: Negative for chest pain, palpitations, diaphoresis, dyspnea and peripheral edema. ; No orthopnea, PND Respiratory: Negative for cough, hemoptysis, wheezing and stridor. No pleuritic chestpain. Gastrointestinal: Negative for nausea, vomiting, diarrhea, constipation, abdominal pain, melena, blood in stool, hematemesis, jaundice and rectal bleeding.    Genitourinary: Negative for frequency, dysuria, incontinence,flank pain and hematuria; Musculoskeletal: Negative for back pain and neck  pain.  Skin: . Negative for pruritus, rash, abrasions, bruising and skin lesion.; ulcerations Neuro: Negative for headache, lightheadedness and neck stiffness. Negative for weakness, altered level of consciousness , altered mental status, extremity weakness, burning feet, involuntary movement, seizure and syncope.  Psych: negative for anxiety, depression, insomnia, tearfulness, panic attacks, hallucinations, paranoia, suicidal or homicidal ideation    Past Medical History  Diagnosis Date  . Hypertension   . Asthma   . Hyperlipemia     History reviewed. No pertinent past surgical history.  Medications:  HOME MEDS: Prior to Admission medications   Medication Sig Start Date End Date Taking? Authorizing Provider  fluticasone-salmeterol (ADVAIR HFA) 115-21 MCG/ACT inhaler Inhale 2 puffs into the lungs 2 (two) times daily.   Yes Historical Provider, MD  HYDROcodone-acetaminophen (NORCO/VICODIN) 5-325 MG per tablet Take 1 tablet by mouth every 4 (four) hours as needed for pain. 11/04/12  Yes Sam Ethelda Chick, MD  montelukast (SINGULAIR) 10 MG tablet Take 10 mg by mouth at bedtime.   Yes Historical Provider, MD  NIFEdipine (PROCARDIA-XL/ADALAT CC) 30 MG 24 hr tablet Take 30 mg by mouth daily.   Yes Historical Provider, MD  omeprazole (PRILOSEC) 20 MG capsule Take 20 mg by mouth 2 (two) times daily.    Yes Historical Provider, MD     Allergies:  Allergies  Allergen Reactions  . Ibuprofen Itching    Social History:   reports that he has never smoked. He does not have any smokeless tobacco history on file. He reports that he does not drink alcohol or use illicit drugs.  Family History: History reviewed. No pertinent family history.   Physical Exam: Filed Vitals:  11/06/12 1930 11/06/12 2054 11/06/12 2115 11/06/12 2130  BP: 106/75  118/88 142/88  Pulse: 108  105 103  Temp:  100.5 F (38.1 C)    TempSrc:  Rectal    Resp: 22  16 12   Height:    6\' 2"  (1.88 m)  SpO2: 99%  95% 97%    Blood pressure 142/88, pulse 103, temperature 100.5 F (38.1 C), temperature source Rectal, resp. rate 12, height 6\' 2"  (1.88 m), SpO2 97.00%.  GEN:  Pleasant  patient lying in the stretcher in no acute distress; cooperative with exam. PSYCH:  alert and oriented x4; does not appear anxious or depressed; affect is appropriate. HEENT: Mucous membranes pink and anicteric; PERRLA; EOM intact; no cervical lymphadenopathy nor thyromegaly or carotid bruit; no JVD; There were no stridor. Neck is very supple. Breasts:: Not examined CHEST WALL: No tenderness CHEST: Normal respiration, clear to auscultation bilaterally.  HEART: Regular rate and rhythm.  There are no murmur, rub, or gallops.   BACK: No kyphosis or scoliosis; no CVA tenderness ABDOMEN: soft and non-tender; no masses, no organomegaly, normal abdominal bowel sounds; no pannus; no intertriginous candida. There is no rebound and no distention. Rectal Exam: Not done EXTREMITIES: No bone or joint deformity; age-appropriate arthropathy of the hands and knees; no edema; no ulcerations.  There is no calf tenderness. There is swelling and redness medial left foot. Genitalia: not examined PULSES: 2+ and symmetric SKIN: Normal hydration no rash or ulceration CNS: Cranial nerves 2-12 grossly intact no focal lateralizing neurologic deficit.  Speech is fluent; uvula elevated with phonation, facial symmetry and tongue midline. DTR are normal bilaterally, cerebella exam is intact, barbinski is negative and strengths are equaled bilaterally.  No sensory loss.   Labs on Admission:  Basic Metabolic Panel:  Recent Labs Lab 11/06/12 2041  NA 137  K 4.2  CL 97  CO2 30  GLUCOSE 120*  BUN 21  CREATININE 1.16  CALCIUM 10.0   Liver Function Tests: No results found for this basename: AST, ALT, ALKPHOS, BILITOT, PROT, ALBUMIN,  in the last 168 hours No results found for this basename: LIPASE, AMYLASE,  in the last 168 hours No results found for this  basename: AMMONIA,  in the last 168 hours CBC:  Recent Labs Lab 11/06/12 2041  WBC 13.6*  NEUTROABS 10.0*  HGB 15.5  HCT 45.5  MCV 74.5*  PLT 157   Cardiac Enzymes: No results found for this basename: CKTOTAL, CKMB, CKMBINDEX, TROPONINI,  in the last 168 hours  CBG: No results found for this basename: GLUCAP,  in the last 168 hours   Radiological Exams on Admission: Dg Chest 2 View  11/06/2012   *RADIOLOGY REPORT*  Clinical Data: Fever, tachycardia, tachypnea  CHEST - 2 VIEW  Comparison: 07/24/2012  Findings: Heart size upper normal to mildly enlarged.  Central vascular congestion.  Mild increased perihilar markings and peribronchial thickening.  Retrocardiac opacity.  No pneumothorax. No acute osseous finding.  IMPRESSION: Retrocardiac opacity is concerning for pneumonia given the clinical presentation.  Increased perihilar markings and peribronchial thickening may reflect bronchitic change versus mild edema.   Original Report Authenticated By: Jearld Lesch, M.D.   Dg Knee Complete 4 Views Left  11/06/2012   *RADIOLOGY REPORT*  Clinical Data: Status post fall Oct 31, 2011.  Left knee pain.  LEFT KNEE - COMPLETE 4+ VIEW  Comparison: Plain films 02/21/2009.  Findings: No acute bony or joint abnormality is identified.  There is degenerative change about the knee  most notable in the medial compartment.  Small joint effusion is seen.  IMPRESSION: No acute finding.  Degenerative change of the medial compartment.   Original Report Authenticated By: Holley Dexter, M.D.    Assessment/Plan Present on Admission:  . HTN (hypertension) . PNA (pneumonia) Left foot swelling   PLAN:  Will continue with VAN/Cefepime.  Will stop IVF as CXR showed some vascular congestion.  I think his left ankle is acute gout rather than trauma.  Will get uric acid and give Indocin.  Home health Aid has been arranged.  He is stable, full Code and will be admitted to [facility] service.  Other plans as per  orders.  Code Status: FULL Unk Lightning, MD. [facility] Pager [phone] 7pm to 7am.  11/06/2012, 11:18 PM

## 2012-11-06 NOTE — ED Notes (Signed)
Pt here via EMS from home with c/o left ankle and knee pain. Pt had a fall about 2 weeks ago and since then not able to for self. Pt's is mother care for pt but not able to care for the pt now because pt not movable.

## 2012-11-07 DIAGNOSIS — R509 Fever, unspecified: Secondary | ICD-10-CM | POA: Diagnosis not present

## 2012-11-07 DIAGNOSIS — I10 Essential (primary) hypertension: Secondary | ICD-10-CM | POA: Diagnosis not present

## 2012-11-07 DIAGNOSIS — J189 Pneumonia, unspecified organism: Secondary | ICD-10-CM | POA: Diagnosis not present

## 2012-11-07 LAB — URIC ACID: Uric Acid, Serum: 8.5 mg/dL — ABNORMAL HIGH (ref 4.0–7.8)

## 2012-11-07 LAB — CBC
HCT: 42.7 % (ref 39.0–52.0)
MCV: 74.9 fL — ABNORMAL LOW (ref 78.0–100.0)
RDW: 14.9 % (ref 11.5–15.5)
WBC: 12.4 10*3/uL — ABNORMAL HIGH (ref 4.0–10.5)

## 2012-11-07 LAB — EXPECTORATED SPUTUM ASSESSMENT W GRAM STAIN, RFLX TO RESP C: Special Requests: NORMAL

## 2012-11-07 LAB — CREATININE, SERUM: GFR calc Af Amer: >90 mL/min (ref >90)

## 2012-11-07 MED ORDER — VANCOMYCIN HCL 10 G IV SOLR
1500.0000 mg | Freq: Two times a day (BID) | INTRAVENOUS | Status: DC
  Administered 2012-11-07 – 2012-11-10 (×7): 1500 mg via INTRAVENOUS
  Filled 2012-11-07 (×9): qty 1500

## 2012-11-07 MED ORDER — COLCHICINE 0.6 MG PO TABS
0.6000 mg | ORAL_TABLET | Freq: Every day | ORAL | Status: DC
  Administered 2012-11-07 – 2012-11-10 (×4): 0.6 mg via ORAL
  Filled 2012-11-07 (×4): qty 1

## 2012-11-07 MED ORDER — SALINE SPRAY 0.65 % NA SOLN
1.0000 | NASAL | Status: DC | PRN
  Administered 2012-11-07: 1 via NASAL
  Filled 2012-11-07: qty 44

## 2012-11-07 MED ORDER — ONDANSETRON HCL 4 MG PO TABS: 4.0000 mg | ORAL_TABLET | Freq: Four times a day (QID) | ORAL | Status: DC | PRN

## 2012-11-07 MED ORDER — ENOXAPARIN SODIUM 40 MG/0.4ML ~~LOC~~ SOLN: 40.0000 mg | SUBCUTANEOUS | Status: DC

## 2012-11-07 MED ORDER — PNEUMOCOCCAL VAC POLYVALENT 25 MCG/0.5ML IJ INJ
0.5000 mL | INJECTION | INTRAMUSCULAR | Status: AC
  Filled 2012-11-07: qty 0.5

## 2012-11-07 MED ORDER — HYDROMORPHONE HCL PF 1 MG/ML IJ SOLN
1.0000 mg | INTRAMUSCULAR | Status: DC | PRN
  Administered 2012-11-07 – 2012-11-08 (×3): 1 mg via INTRAVENOUS
  Filled 2012-11-07 (×4): qty 1

## 2012-11-07 MED ORDER — SODIUM CHLORIDE 0.9 % IJ SOLN
3.0000 mL | Freq: Two times a day (BID) | INTRAMUSCULAR | Status: DC
  Administered 2012-11-07 – 2012-11-10 (×7): 3 mL via INTRAVENOUS

## 2012-11-07 MED ORDER — MOMETASONE FURO-FORMOTEROL FUM 100-5 MCG/ACT IN AERO
2.0000 | INHALATION_SPRAY | Freq: Two times a day (BID) | RESPIRATORY_TRACT | Status: DC
  Administered 2012-11-07 – 2012-11-10 (×5): 2 via RESPIRATORY_TRACT
  Filled 2012-11-07: qty 8.8

## 2012-11-07 MED ORDER — INDOMETHACIN 50 MG PO CAPS
50.0000 mg | ORAL_CAPSULE | Freq: Three times a day (TID) | ORAL | Status: DC | PRN
  Administered 2012-11-07: 50 mg via ORAL
  Filled 2012-11-07: qty 1

## 2012-11-07 MED ORDER — HYDROCODONE-ACETAMINOPHEN 5-325 MG PO TABS
1.0000 | ORAL_TABLET | ORAL | Status: DC | PRN
  Administered 2012-11-07 – 2012-11-10 (×4): 1 via ORAL
  Filled 2012-11-07 (×4): qty 1

## 2012-11-07 MED ORDER — BIOTENE DRY MOUTH MT LIQD
15.0000 mL | Freq: Two times a day (BID) | OROMUCOSAL | Status: DC
  Administered 2012-11-07 – 2012-11-10 (×7): 15 mL via OROMUCOSAL

## 2012-11-07 MED ORDER — DEXTROSE 5 % IV SOLN
2.0000 g | Freq: Three times a day (TID) | INTRAVENOUS | Status: DC
  Administered 2012-11-07 – 2012-11-10 (×10): 2 g via INTRAVENOUS
  Filled 2012-11-07 (×13): qty 2

## 2012-11-07 MED ORDER — ONDANSETRON HCL 4 MG/2ML IJ SOLN: 4.0000 mg | Freq: Four times a day (QID) | INTRAMUSCULAR | Status: DC | PRN

## 2012-11-07 MED ORDER — VANCOMYCIN HCL 10 G IV SOLR
1500.0000 mg | Freq: Once | INTRAVENOUS | Status: AC
  Administered 2012-11-07: 1500 mg via INTRAVENOUS
  Filled 2012-11-07: qty 1500

## 2012-11-07 MED ORDER — NIFEDIPINE ER 30 MG PO TB24
30.0000 mg | ORAL_TABLET | Freq: Every day | ORAL | Status: DC
  Administered 2012-11-07 – 2012-11-10 (×4): 30 mg via ORAL
  Filled 2012-11-07 (×4): qty 1

## 2012-11-07 MED ORDER — DEXTROSE 5 % IV SOLN: 1.0000 g | Freq: Two times a day (BID) | INTRAVENOUS | Status: DC

## 2012-11-07 MED ORDER — PANTOPRAZOLE SODIUM 40 MG PO TBEC: 40.0000 mg | DELAYED_RELEASE_TABLET | Freq: Every day | ORAL | Status: DC

## 2012-11-07 MED ORDER — PANTOPRAZOLE SODIUM 40 MG PO TBEC
40.0000 mg | DELAYED_RELEASE_TABLET | Freq: Every day | ORAL | Status: DC
  Administered 2012-11-07 – 2012-11-10 (×4): 40 mg via ORAL
  Filled 2012-11-07 (×3): qty 1

## 2012-11-07 MED ORDER — INDOMETHACIN 25 MG PO CAPS
25.0000 mg | ORAL_CAPSULE | Freq: Two times a day (BID) | ORAL | Status: DC
  Administered 2012-11-08 – 2012-11-10 (×5): 25 mg via ORAL
  Filled 2012-11-07 (×8): qty 1

## 2012-11-07 MED ORDER — DOCUSATE SODIUM 100 MG PO CAPS
100.0000 mg | ORAL_CAPSULE | Freq: Two times a day (BID) | ORAL | Status: DC
  Administered 2012-11-07 – 2012-11-09 (×6): 100 mg via ORAL
  Filled 2012-11-07 (×9): qty 1

## 2012-11-07 MED ORDER — MONTELUKAST SODIUM 10 MG PO TABS
10.0000 mg | ORAL_TABLET | Freq: Every day | ORAL | Status: DC
  Administered 2012-11-07 – 2012-11-09 (×3): 10 mg via ORAL
  Filled 2012-11-07 (×5): qty 1

## 2012-11-07 MED ORDER — ENOXAPARIN SODIUM 80 MG/0.8ML ~~LOC~~ SOLN
0.5000 mg/kg | SUBCUTANEOUS | Status: DC
  Administered 2012-11-07 – 2012-11-08 (×2): 75 mg via SUBCUTANEOUS
  Administered 2012-11-09: 12:00:00 via SUBCUTANEOUS
  Administered 2012-11-10: 75 mg via SUBCUTANEOUS
  Filled 2012-11-07 (×4): qty 0.8
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
## 2012-11-07 NOTE — ED Provider Notes (Signed)
  Medical screening examination/treatment/procedure(s) were performed by non-physician practitioner and as supervising physician I was immediately available for consultation/collaboration.    Gerhard Munch, MD 11/07/12 0000
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
## 2012-11-07 NOTE — Plan of Care (Signed)
Problem: Phase II Progression Outcomes Goal: Pain controlled Outcome: Completed/Met Date Met:  11/07/12 No complaints of pain
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
## 2012-11-07 NOTE — Progress Notes (Signed)
Utilization Review Completed.Roberto Horne T5/16/2014  
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
## 2012-11-07 NOTE — Progress Notes (Signed)
Received a message from evening SW in regards to pt needing f/u on [facility] for PT,OT, RN, CNA services. Pt admitted to [facility] 2000 unit. Order was placed by provider. CM Sidney Ace on 2000 notified.

## 2012-11-07 NOTE — ED Notes (Addendum)
Wt 194.3 kg, Ht 6.2

## 2012-11-07 NOTE — Progress Notes (Signed)
ANTIBIOTIC CONSULT NOTE - INITIAL  Pharmacy Consult for vancomycin, cefepime Indication: rule out pneumonia  Allergies  Allergen Reactions  . Ibuprofen Itching    Patient Measurements: Height: 6\' 2"  (188 cm) (per patient report) Weight: 322 lb 1.5 oz (146.1 kg) IBW/kg (Calculated) : 82.2  Vital Signs: Temp: 98.5 F (36.9 C) (05/16 0229) Temp src: Oral (05/16 0229) BP: 114/54 mmHg (05/16 0229) Pulse Rate: 91 (05/16 0229) Intake/Output from previous day: 05/15 0701 - 05/16 0700 In: -  Out: 150 [Urine:150] Intake/Output from this shift: Total I/O In: -  Out: 150 [Urine:150]  Labs:  Recent Labs  11/06/12 2041  WBC 13.6*  HGB 15.5  PLT 157  CREATININE 1.16   Estimated Creatinine Clearance: 113.6 ml/min (by C-G formula based on Cr of 1.16). No results found for this basename: VANCOTROUGH, VANCOPEAK, VANCORANDOM, GENTTROUGH, GENTPEAK, GENTRANDOM, TOBRATROUGH, TOBRAPEAK, TOBRARND, AMIKACINPEAK, AMIKACINTROU, AMIKACIN,  in the last 72 hours   Microbiology: No results found for this or any previous visit (from the past 720 hour(s)).  Medical History: Past Medical History  Diagnosis Date  . Hypertension   . Asthma   . Hyperlipemia     Medications:  Scheduled:  . ceFEPime (MAXIPIME) IV  1 g Intravenous Q12H  . docusate sodium  100 mg Oral BID  . enoxaparin (LOVENOX) injection  40 mg Subcutaneous Q24H  . mometasone-formoterol  2 puff Inhalation BID  . montelukast  10 mg Oral QHS  . NIFEdipine  30 mg Oral Daily  . pantoprazole  40 mg Oral Daily  . sodium chloride  3 mL Intravenous Q12H   Assessment: 52 yo male presented with L foot and ankle pain. Pharmacy to manage vancomycin and cefepime for possible pneumonia.   Goal of Therapy:  Vancomycin trough level 15-20 mcg/ml  Plan:  1. Vancomycin 1.5gm IV x 1 (total of 2.5gm loading dose), then 1.5gm IV Q12H starting at 08:00.  2. Cefepime 2gm IV Q8H.   Emeline Gins 11/07/2012,2:41 AM

## 2012-11-08 DIAGNOSIS — E785 Hyperlipidemia, unspecified: Secondary | ICD-10-CM | POA: Diagnosis not present

## 2012-11-08 DIAGNOSIS — R509 Fever, unspecified: Secondary | ICD-10-CM | POA: Diagnosis not present

## 2012-11-08 DIAGNOSIS — I10 Essential (primary) hypertension: Secondary | ICD-10-CM | POA: Diagnosis not present

## 2012-11-08 DIAGNOSIS — J189 Pneumonia, unspecified organism: Secondary | ICD-10-CM | POA: Diagnosis not present

## 2012-11-08 LAB — BASIC METABOLIC PANEL
CO2: 29 mEq/L (ref 19–32)
GFR calc non Af Amer: 79 mL/min — ABNORMAL LOW (ref >90)
Glucose, Bld: 112 mg/dL — ABNORMAL HIGH (ref 70–99)
Potassium: 4.7 mEq/L (ref 3.5–5.1)
Sodium: 136 mEq/L (ref 135–145)

## 2012-11-08 LAB — CBC
Hemoglobin: 12.5 g/dL — ABNORMAL LOW (ref 13.0–17.0)
RBC: 5.14 MIL/uL (ref 4.22–5.81)

## 2012-11-08 LAB — URINE CULTURE: Colony Count: NO GROWTH

## 2012-11-08 LAB — GLUCOSE, CAPILLARY: Glucose-Capillary: 100 mg/dL — ABNORMAL HIGH (ref 70–99)

## 2012-11-08 NOTE — Progress Notes (Signed)
Clinical Social Work Department BRIEF PSYCHOSOCIAL ASSESSMENT 11/08/2012  Patient:  Roberto Horne, Roberto Horne     Account Number:  [id number]     Admit date:  11/06/2012  Clinical Social Worker:  Orpah Greek  Date/Time:  11/08/2012 03:53 PM  Referred by:  Physician  Date Referred:  11/08/2012 Referred for  SNF Placement   Other Referral:   Interview type:  Patient Other interview type:    PSYCHOSOCIAL DATA Living Status:  FAMILY Admitted from facility:   Level of care:   Primary support name:  Willis Modena (mother) ph#: [phone] Primary support relationship to patient:  PARENT Degree of support available:   good    CURRENT CONCERNS Current Concerns  Post-Acute Placement   Other Concerns:    SOCIAL WORK ASSESSMENT / PLAN CSW spoke with patient re: discharge planning. Patient was admitted from home with his mother, though PT recommending SNF at discharge.   Assessment/plan status:  Information/Referral to [facility] Other assessment/ plan:   Information/referral to community resources:   CSW completed FL2 and faxed information out to [facility] - provided patient with list of facilities & will follow-up with bed offers when available.    PATIENT'S/FAMILY'S RESPONSE TO PLAN OF CARE: Patient is agreeable to SNF search, anticipating discharge possibly Monday.       Unice Bailey, LCSW [facility] Clinical Social Worker cell #: [phone]

## 2012-11-08 NOTE — Progress Notes (Signed)
[facility] PROGRESS NOTE  Roberto Horne WUJ:[id number] DOB: 23-Mar-1961 DOA: 11/06/2012 PCP: Jaclyn Shaggy, MD  Assessment/Plan: 1. PNA: Continue with Vancomycin and cefepime day 3. WBC trending down. Sputum culture pending.  2. Left foot pain: Uric acid very mild elevated. Less likely infection no significant redness. Could be gout, pseudogout, or inflammation after trauma. Antibiotics should cover for infection. Continue with colchicine. Continue with indomethacin day 2. Pain has improved.   3. HTN; Continue with nifedipine.   Code Status: Full  Family Communication: Care discussed with patient.  Disposition Plan: to be determine. Patient will probably need placement, he does not have good home environment.    Consultants:  none  Procedures:  none  Antibiotics:  Cefepime 5- 15  Vancomycin 5-15.  HPI/Subjective: Left foot pain better. He is breathing better.   Objective: Filed Vitals:   11/07/12 1341 11/07/12 2014 11/08/12 0404 11/08/12 1400  BP: 104/69 117/79 104/65 118/71  Pulse: 97 101 111 77  Temp: 98.7 F (37.1 C) 98.2 F (36.8 C) 98.2 F (36.8 C) 98.4 F (36.9 C)  TempSrc: Oral Oral Oral Oral  Resp: 20 20 19 18   Height:      Weight:      SpO2: 98% 95% 94% 93%    Intake/Output Summary (Last 24 hours) at 11/08/12 1406 Last data filed at 11/08/12 0508  Gross per 24 hour  Intake    240 ml  Output    700 ml  Net   -460 ml   Filed Weights   11/07/12 0229  Weight: 146.1 kg (322 lb 1.5 oz)    Exam:   General:  No distress.   Cardiovascular: S 1, S 2 RRR  Respiratory: Crackles at bases.  Abdomen: Bs present, soft, NT, ND.   Musculoskeletal: Left foot ankle with swelling, no redness, able to move ankle , joint.   Data Reviewed: Basic Metabolic Panel:  Recent Labs Lab 11/06/12 2041 11/07/12 0239 11/08/12 0410  NA 137  --  136  K 4.2  --  4.7  CL 97  --  102  CO2 30  --  29  GLUCOSE 120*  --  112*  BUN 21  --  22  CREATININE  1.16 0.94 1.06  CALCIUM 10.0  --  8.7   Liver Function Tests: No results found for this basename: AST, ALT, ALKPHOS, BILITOT, PROT, ALBUMIN,  in the last 168 hours No results found for this basename: LIPASE, AMYLASE,  in the last 168 hours No results found for this basename: AMMONIA,  in the last 168 hours CBC:  Recent Labs Lab 11/06/12 2041 11/07/12 0239 11/08/12 0410  WBC 13.6* 12.4* 10.5  NEUTROABS 10.0*  --   --   HGB 15.5 14.1 12.5*  HCT 45.5 42.7 39.4  MCV 74.5* 74.9* 76.7*  PLT 157 151 146*   Cardiac Enzymes: No results found for this basename: CKTOTAL, CKMB, CKMBINDEX, TROPONINI,  in the last 168 hours BNP (last 3 results) No results found for this basename: PROBNP,  in the last 8760 hours CBG: No results found for this basename: GLUCAP,  in the last 168 hours  Recent Results (from the past 240 hour(s))  URINE CULTURE     Status: None   Collection Time    11/06/12 10:22 PM      Result Value Range Status   Specimen Description URINE, CLEAN CATCH   Final   Special Requests NONE   Final   Culture  Setup Time 11/07/2012 00:13  Final   Colony Count NO GROWTH   Final   Culture NO GROWTH   Final   Report Status 11/08/2012 FINAL   Final  CULTURE, BLOOD (ROUTINE X 2)     Status: None   Collection Time    11/06/12 10:45 PM      Result Value Range Status   Specimen Description BLOOD RIGHT HAND   Final   Special Requests     Final   Value: BOTTLES DRAWN AEROBIC AND ANAEROBIC 10CC BLUE 5CC RED   Culture  Setup Time 11/07/2012 04:29   Final   Culture     Final   Value:        BLOOD CULTURE RECEIVED NO GROWTH TO DATE CULTURE WILL BE HELD FOR 5 DAYS BEFORE ISSUING A FINAL NEGATIVE REPORT   Report Status PENDING   Incomplete  CULTURE, BLOOD (ROUTINE X 2)     Status: None   Collection Time    11/06/12 11:11 PM      Result Value Range Status   Specimen Description BLOOD LEFT HAND   Final   Special Requests BOTTLES DRAWN AEROBIC ONLY 5CC   Final   Culture  Setup Time  11/07/2012 04:29   Final   Culture     Final   Value:        BLOOD CULTURE RECEIVED NO GROWTH TO DATE CULTURE WILL BE HELD FOR 5 DAYS BEFORE ISSUING A FINAL NEGATIVE REPORT   Report Status PENDING   Incomplete  CULTURE, EXPECTORATED SPUTUM-ASSESSMENT     Status: None   Collection Time    11/07/12  7:37 PM      Result Value Range Status   Specimen Description SPUTUM   Final   Special Requests Normal   Final   Sputum evaluation     Final   Value: THIS SPECIMEN IS ACCEPTABLE. RESPIRATORY CULTURE REPORT TO FOLLOW.   Report Status 11/07/2012 FINAL   Final  CULTURE, RESPIRATORY (NON-EXPECTORATED)     Status: None   Collection Time    11/07/12  7:37 PM      Result Value Range Status   Specimen Description SPUTUM   Final   Special Requests NONE   Final   Gram Stain     Final   Value: RARE WBC PRESENT, PREDOMINANTLY PMN     RARE SQUAMOUS EPITHELIAL CELLS PRESENT     RARE GRAM NEGATIVE RODS     RARE GRAM POSITIVE COCCI     IN PAIRS   Culture Culture reincubated for better growth   Final   Report Status PENDING   Incomplete     Studies: Dg Chest 2 View  11/06/2012   *RADIOLOGY REPORT*  Clinical Data: Fever, tachycardia, tachypnea  CHEST - 2 VIEW  Comparison: 07/24/2012  Findings: Heart size upper normal to mildly enlarged.  Central vascular congestion.  Mild increased perihilar markings and peribronchial thickening.  Retrocardiac opacity.  No pneumothorax. No acute osseous finding.  IMPRESSION: Retrocardiac opacity is concerning for pneumonia given the clinical presentation.  Increased perihilar markings and peribronchial thickening may reflect bronchitic change versus mild edema.   Original Report Authenticated By: Jearld Lesch, M.D.   Dg Knee Complete 4 Views Left  11/06/2012   *RADIOLOGY REPORT*  Clinical Data: Status post fall Oct 31, 2011.  Left knee pain.  LEFT KNEE - COMPLETE 4+ VIEW  Comparison: Plain films 02/21/2009.  Findings: No acute bony or joint abnormality is identified.   There is degenerative change about the knee  most notable in the medial compartment.  Small joint effusion is seen.  IMPRESSION: No acute finding.  Degenerative change of the medial compartment.   Original Report Authenticated By: Holley Dexter, M.D.    Scheduled Meds: . antiseptic oral rinse  15 mL Mouth Rinse BID  . ceFEPime (MAXIPIME) IV  2 g Intravenous Q8H  . colchicine  0.6 mg Oral Daily  . docusate sodium  100 mg Oral BID  . enoxaparin (LOVENOX) injection  0.5 mg/kg Subcutaneous Q24H  . indomethacin  25 mg Oral BID WC  . mometasone-formoterol  2 puff Inhalation BID  . montelukast  10 mg Oral QHS  . NIFEdipine  30 mg Oral Daily  . pantoprazole  40 mg Oral Daily  . pneumococcal 23 valent vaccine  0.5 mL Intramuscular Tomorrow-1000  . sodium chloride  3 mL Intravenous Q12H  . vancomycin  1,500 mg Intravenous Q12H   Continuous Infusions:   Active Problems:   HTN (hypertension)   PNA (pneumonia)   Hyperlipidemia    Time spent: 35 minutes.     Nolita Kutter  [facility] Pager [phone]. If 7PM-7AM, please contact night-coverage at [email or web address], password [facility] 11/08/2012, 2:06 PM  LOS: 2 days

## 2012-11-08 NOTE — Progress Notes (Signed)
Clinical Social Work Department CLINICAL SOCIAL WORK PLACEMENT NOTE 11/08/2012  Patient:  Roberto Horne, Roberto Horne  Account Number:  [id number] Admit date:  11/06/2012  Clinical Social Worker:  Orpah Greek  Date/time:  11/08/2012 04:02 PM  Clinical Social Work is seeking post-discharge placement for this patient at the following level of care:   SKILLED NURSING   (*CSW will update this form in Epic as items are completed)   11/08/2012  Patient/family provided with [facility] Department of Clinical Social Work's list of facilities offering this level of care within the geographic area requested by the patient (or if unable, by the patient's family).  11/08/2012  Patient/family informed of their freedom to choose among providers that offer the needed level of care, that participate in Medicare, Medicaid or managed care program needed by the patient, have an available bed and are willing to accept the patient.  11/08/2012  Patient/family informed of [facility]' ownership interest in [facility], as well as of the fact that they are under no obligation to receive care at this facility.  PASARR submitted to EDS on 11/08/2012 PASARR number received from EDS on 11/08/2012  FL2 transmitted to all facilities in geographic area requested by pt/family on  11/08/2012 FL2 transmitted to all facilities within larger geographic area on   Patient informed that his/her managed care company has contracts with or will negotiate with  certain facilities, including the following:     Patient/family informed of bed offers received:   Patient chooses bed at  Physician recommends and patient chooses bed at    Patient to be transferred to  on   Patient to be transferred to facility by   The following physician request were entered in Epic:   Additional Comments:   Unice Bailey, LCSW [facility] Clinical Social Worker cell #: [phone] (weekend coverage)

## 2012-11-08 NOTE — Evaluation (Signed)
Physical Therapy Evaluation Patient Details Name: Roberto Horne MRN: [id number] DOB: 1960-10-31 Today's Date: 11/08/2012 Time: 4098-1191 PT Time Calculation (min): 25 min  PT Assessment / Plan / Recommendation Clinical Impression  Pt is a 52 y.o. male who presents with RLE stiffness and pain. Patient demonstrates significant deficits in functional mobility at this time. Pt may benefit from continued skilled PT to address deficits and maximize function. Rec ST SNF upon discharge as patient unable to ambulate and requires increased assist at this time.    PT Assessment  Patient needs continued PT services    Follow Up Recommendations  SNF    Does the patient have the potential to tolerate intense rehabilitation      Barriers to Discharge        Equipment Recommendations   (TBD)    Recommendations for Other Services     Frequency Min 2X/week    Precautions / Restrictions Precautions Precautions: Fall   Pertinent Vitals/Pain States no pain, but cries out in pain when moving (questionable baseline cognition)      Mobility  Bed Mobility Bed Mobility: Rolling Right;Rolling Left;Supine to Sit;Sitting - Scoot to Edge of Bed Rolling Right: 4: Min assist Rolling Left: 4: Min assist Supine to Sit: 3: Mod assist Sitting - Scoot to Edge of Bed: 4: Min assist Details for Bed Mobility Assistance: Max VCs for sequencing, pt with difficulty comprehending instructions, tactile cues and assist required Transfers Transfers: Sit to Stand;Stand to Sit;Squat Pivot Transfers Sit to Stand: 3: Mod assist (could not get full clearance to stand despite 4 trials) Stand to Sit: 4: Min guard Squat Pivot Transfers: 3: Mod assist Details for Transfer Assistance: Elevated bed, squat pivot/lateral transfer to chair required multiple breaks in position.  Ambulation/Gait Ambulation/Gait Assistance: Not tested (comment)    Exercises Total Joint Exercises Ankle Circles/Pumps: AROM;Both;10 reps   PT  Diagnosis: Generalized weakness;Acute pain  PT Problem List: Decreased strength;Decreased range of motion;Decreased activity tolerance;Decreased balance;Decreased mobility;Decreased cognition;Obesity PT Treatment Interventions: DME instruction;Gait training;Stair training;Functional mobility training;Therapeutic activities;Therapeutic exercise;Balance training;Patient/family education   PT Goals Acute Rehab PT Goals PT Goal Formulation: With patient Time For Goal Achievement: 11/22/12 Potential to Achieve Goals: Fair Pt will go Supine/Side to Sit: with supervision PT Goal: Supine/Side to Sit - Progress: Goal set today Pt will go Sit to Stand: with supervision PT Goal: Sit to Stand - Progress: Goal set today Pt will Ambulate: 16 - 50 feet;with supervision;with rolling walker PT Goal: Ambulate - Progress: Goal set today Pt will Go Up / Down Stairs: 3-5 stairs;with min assist PT Goal: Up/Down Stairs - Progress: Goal set today  Visit Information  Last PT Received On: 11/08/12 Assistance Needed: +2    Subjective Data  Subjective: My leg is so stiff Patient Stated Goal: To stand up again   Prior Functioning  Home Living Lives With: Family Available Help at Discharge: Family Type of Home: House Home Access: Stairs to enter Secretary/administrator of Steps: 2 Entrance Stairs-Rails: None Home Layout: One level Bathroom Shower/Tub: Engineer, manufacturing systems: Standard Home Adaptive Equipment: None Prior Function Level of Independence: Independent Able to Take Stairs?: Yes Communication Communication: Other (comment) (Pt with very juvenile affect and communication) Dominant Hand: Right    Cognition  Cognition Arousal/Alertness: Awake/alert Behavior During Therapy: Anxious Overall Cognitive Status: No family/caregiver present to determine baseline cognitive functioning    Extremity/Trunk Assessment Right Upper Extremity Assessment RUE ROM/Strength/Tone: [facility] for tasks  assessed Left Upper Extremity Assessment LUE ROM/Strength/Tone: [facility] for tasks assessed  Right Lower Extremity Assessment RLE ROM/Strength/Tone: Deficits;Unable to fully assess;Due to pain;Due to impaired cognition RLE ROM/Strength/Tone Deficits: patient with difficulty performing commands for strength testing Left Lower Extremity Assessment LLE ROM/Strength/Tone: Unable to fully assess;Due to impaired cognition   Balance  Static Sitting Balance: Stand by Assist; 3 minutes  End of Session PT - End of Session Equipment Utilized During Treatment: Gait belt Activity Tolerance: Patient limited by fatigue Patient left: in chair;with call bell/phone within reach Nurse Communication: Mobility status;Other (comment) (IV site to be checked, new tele leads needed)  GP     Fabio Asa 11/08/2012, 10:06 AM Charlotte Crumb, PT DPT  [phone]

## 2012-11-08 NOTE — Plan of Care (Signed)
Problem: Food- and Nutrition-Related Knowledge Deficit (NB-1.1) Goal: Nutrition education Formal process to instruct or train a patient/client in a skill or to impart knowledge to help patients/clients voluntarily manage or modify food choices and eating behavior to maintain or improve health. Outcome: Completed/Met Date Met:  11/08/12  RD consulted for nutrition education regarding weight loss.  Per pt he drinks a lot of sweet tea and soda. Pt eats three meals per day and likes fruits and vegetables but does not eat them regularly. Pt lives at home with mom who does the cooking. Per record review d/c plan is not yet confirmed.   Body mass index is 41.34 kg/(m^2). 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  RD provided "Weight Loss Tips" handout from the Academy of Nutrition [employer] Dietetics. Emphasized the importance of hydration with calorie-free beverages and limiting sugar-sweetened beverages. Also encouraged pt to include fruits/vegetables at each meal. Teach back method used. Had to educate twice as pt could not recall the two guidelines we reviewed. In the end pt verbalized his understanding with RN present who provided encouragement.   Expect poor to fair compliance.  Current diet order is Regular, patient is consuming approximately 100% of meals at this time. Labs and medications reviewed. No further nutrition interventions warranted at this time. RD contact information provided. If additional nutrition issues arise, please re-consult RD.  Kendell Bane RD, LDN, CNSC [phone] Pager [phone] After Hours Pager

## 2012-11-09 DIAGNOSIS — J189 Pneumonia, unspecified organism: Secondary | ICD-10-CM | POA: Diagnosis not present

## 2012-11-09 DIAGNOSIS — R5381 Other malaise: Secondary | ICD-10-CM

## 2012-11-09 DIAGNOSIS — I10 Essential (primary) hypertension: Secondary | ICD-10-CM | POA: Diagnosis not present

## 2012-11-09 DIAGNOSIS — R509 Fever, unspecified: Secondary | ICD-10-CM | POA: Diagnosis not present

## 2012-11-09 DIAGNOSIS — R5383 Other fatigue: Secondary | ICD-10-CM | POA: Diagnosis not present

## 2012-11-09 LAB — BASIC METABOLIC PANEL
Calcium: 9 mg/dL (ref 8.4–10.5)
Creatinine, Ser: 0.82 mg/dL (ref 0.50–1.35)
GFR calc non Af Amer: >90 mL/min (ref >90)
Glucose, Bld: 111 mg/dL — ABNORMAL HIGH (ref 70–99)
Sodium: 135 mEq/L (ref 135–145)

## 2012-11-09 LAB — CBC
Hemoglobin: 12.4 g/dL — ABNORMAL LOW (ref 13.0–17.0)
MCH: 24.6 pg — ABNORMAL LOW (ref 26.0–34.0)
MCHC: 32.2 g/dL (ref 30.0–36.0)
MCV: 76.4 fL — ABNORMAL LOW (ref 78.0–100.0)

## 2012-11-09 NOTE — Progress Notes (Signed)
[facility] PROGRESS NOTE  Zahmir Lalla ZOX:[id number] DOB: Mar 23, 1961 DOA: 11/06/2012 PCP: Jaclyn Shaggy, MD  Assessment/Plan: 1. PNA: Continue with Vancomycin and cefepime day 4. WBC trending down. Sputum culture normal oropharyngeal flora. Will probably discharge him on Levaquin.  2. Left foot pain: Uric acid very mild elevated. Less likely infection no significant redness. Could be gout, pseudogout, or inflammation after trauma. Antibiotics should cover for infection. Continue with colchicine. Continue with indomethacin day 3. Pain has improved.  3. HTN; Continue with nifedipine.   Code Status: Full  Family Communication: Care discussed with patient.  Disposition Plan: Waiting bed at SNF.    Consultants:  none  Procedures:  none  Antibiotics:  Cefepime 5- 15  Vancomycin 5-15.  HPI/Subjective: Left foot pain better, on and off. No pain on palpation. He is breathing better.  ough better.   Objective: Filed Vitals:   11/08/12 1400 11/08/12 1952 11/09/12 0353 11/09/12 1402  BP: 118/71 120/81 131/92 126/80  Pulse: 77  96 87  Temp: 98.4 F (36.9 C) 98 F (36.7 C) 98.3 F (36.8 C) 97.9 F (36.6 C)  TempSrc: Oral Oral Oral Oral  Resp: 18 20 21 22   Height:      Weight:      SpO2: 93% 95% 98% 96%    Intake/Output Summary (Last 24 hours) at 11/09/12 1555 Last data filed at 11/09/12 0930  Gross per 24 hour  Intake      0 ml  Output   1050 ml  Net  -1050 ml   Filed Weights   11/07/12 0229  Weight: 146.1 kg (322 lb 1.5 oz)    Exam:   General:  No distress.   Cardiovascular: S 1, S 2 RRR  Respiratory: Crackles at bases.  Abdomen: Bs present, soft, NT, ND.   Musculoskeletal: Left foot ankle with decreases swelling, no redness, able to move ankle , joint.   Data Reviewed: Basic Metabolic Panel:  Recent Labs Lab 11/06/12 2041 11/07/12 0239 11/08/12 0410 11/09/12 0420  NA 137  --  136 135  K 4.2  --  4.7 4.4  CL 97  --  102 101  CO2 30   --  29 28  GLUCOSE 120*  --  112* 111*  BUN 21  --  22 16  CREATININE 1.16 0.94 1.06 0.82  CALCIUM 10.0  --  8.7 9.0   Liver Function Tests: No results found for this basename: AST, ALT, ALKPHOS, BILITOT, PROT, ALBUMIN,  in the last 168 hours No results found for this basename: LIPASE, AMYLASE,  in the last 168 hours No results found for this basename: AMMONIA,  in the last 168 hours CBC:  Recent Labs Lab 11/06/12 2041 11/07/12 0239 11/08/12 0410 11/09/12 0420  WBC 13.6* 12.4* 10.5 9.2  NEUTROABS 10.0*  --   --   --   HGB 15.5 14.1 12.5* 12.4*  HCT 45.5 42.7 39.4 38.5*  MCV 74.5* 74.9* 76.7* 76.4*  PLT 157 151 146* 156   Cardiac Enzymes: No results found for this basename: CKTOTAL, CKMB, CKMBINDEX, TROPONINI,  in the last 168 hours BNP (last 3 results) No results found for this basename: PROBNP,  in the last 8760 hours CBG:  Recent Labs Lab 11/08/12 1629  GLUCAP 100*    Recent Results (from the past 240 hour(s))  URINE CULTURE     Status: None   Collection Time    11/06/12 10:22 PM      Result Value Range Status   Specimen  Description URINE, CLEAN CATCH   Final   Special Requests NONE   Final   Culture  Setup Time 11/07/2012 00:13   Final   Colony Count NO GROWTH   Final   Culture NO GROWTH   Final   Report Status 11/08/2012 FINAL   Final  CULTURE, BLOOD (ROUTINE X 2)     Status: None   Collection Time    11/06/12 10:45 PM      Result Value Range Status   Specimen Description BLOOD RIGHT HAND   Final   Special Requests     Final   Value: BOTTLES DRAWN AEROBIC AND ANAEROBIC 10CC BLUE 5CC RED   Culture  Setup Time 11/07/2012 04:29   Final   Culture     Final   Value:        BLOOD CULTURE RECEIVED NO GROWTH TO DATE CULTURE WILL BE HELD FOR 5 DAYS BEFORE ISSUING A FINAL NEGATIVE REPORT   Report Status PENDING   Incomplete  CULTURE, BLOOD (ROUTINE X 2)     Status: None   Collection Time    11/06/12 11:11 PM      Result Value Range Status   Specimen  Description BLOOD LEFT HAND   Final   Special Requests BOTTLES DRAWN AEROBIC ONLY 5CC   Final   Culture  Setup Time 11/07/2012 04:29   Final   Culture     Final   Value:        BLOOD CULTURE RECEIVED NO GROWTH TO DATE CULTURE WILL BE HELD FOR 5 DAYS BEFORE ISSUING A FINAL NEGATIVE REPORT   Report Status PENDING   Incomplete  CULTURE, EXPECTORATED SPUTUM-ASSESSMENT     Status: None   Collection Time    11/07/12  7:37 PM      Result Value Range Status   Specimen Description SPUTUM   Final   Special Requests Normal   Final   Sputum evaluation     Final   Value: THIS SPECIMEN IS ACCEPTABLE. RESPIRATORY CULTURE REPORT TO FOLLOW.   Report Status 11/07/2012 FINAL   Final  CULTURE, RESPIRATORY (NON-EXPECTORATED)     Status: None   Collection Time    11/07/12  7:37 PM      Result Value Range Status   Specimen Description SPUTUM   Final   Special Requests NONE   Final   Gram Stain     Final   Value: RARE WBC PRESENT, PREDOMINANTLY PMN     RARE SQUAMOUS EPITHELIAL CELLS PRESENT     RARE GRAM NEGATIVE RODS     RARE GRAM POSITIVE COCCI     IN PAIRS   Culture NORMAL OROPHARYNGEAL FLORA   Final   Report Status PENDING   Incomplete     Studies: No results found.  Scheduled Meds: . antiseptic oral rinse  15 mL Mouth Rinse BID  . ceFEPime (MAXIPIME) IV  2 g Intravenous Q8H  . colchicine  0.6 mg Oral Daily  . docusate sodium  100 mg Oral BID  . enoxaparin (LOVENOX) injection  0.5 mg/kg Subcutaneous Q24H  . indomethacin  25 mg Oral BID WC  . mometasone-formoterol  2 puff Inhalation BID  . montelukast  10 mg Oral QHS  . NIFEdipine  30 mg Oral Daily  . pantoprazole  40 mg Oral Daily  . sodium chloride  3 mL Intravenous Q12H  . vancomycin  1,500 mg Intravenous Q12H   Continuous Infusions:   Principal Problem:   PNA (pneumonia) Active Problems:  HTN (hypertension)   Hyperlipidemia    Time spent: 25 minutes.     Roberto Horne  [facility] Pager [phone]. If 7PM-7AM,  please contact night-coverage at [email or web address], password [facility] 11/09/2012, 3:55 PM  LOS: 3 days

## 2012-11-10 DIAGNOSIS — M25579 Pain in unspecified ankle and joints of unspecified foot: Secondary | ICD-10-CM | POA: Diagnosis not present

## 2012-11-10 DIAGNOSIS — R5381 Other malaise: Secondary | ICD-10-CM | POA: Diagnosis not present

## 2012-11-10 DIAGNOSIS — M79609 Pain in unspecified limb: Secondary | ICD-10-CM

## 2012-11-10 DIAGNOSIS — I10 Essential (primary) hypertension: Secondary | ICD-10-CM | POA: Diagnosis not present

## 2012-11-10 DIAGNOSIS — K219 Gastro-esophageal reflux disease without esophagitis: Secondary | ICD-10-CM | POA: Diagnosis not present

## 2012-11-10 DIAGNOSIS — G3184 Mild cognitive impairment, so stated: Secondary | ICD-10-CM | POA: Diagnosis not present

## 2012-11-10 DIAGNOSIS — J45909 Unspecified asthma, uncomplicated: Secondary | ICD-10-CM | POA: Diagnosis not present

## 2012-11-10 DIAGNOSIS — J189 Pneumonia, unspecified organism: Secondary | ICD-10-CM | POA: Diagnosis not present

## 2012-11-10 DIAGNOSIS — M109 Gout, unspecified: Secondary | ICD-10-CM | POA: Diagnosis not present

## 2012-11-10 DIAGNOSIS — M129 Arthropathy, unspecified: Secondary | ICD-10-CM | POA: Diagnosis not present

## 2012-11-10 DIAGNOSIS — K5900 Constipation, unspecified: Secondary | ICD-10-CM | POA: Diagnosis not present

## 2012-11-10 DIAGNOSIS — M6281 Muscle weakness (generalized): Secondary | ICD-10-CM | POA: Diagnosis not present

## 2012-11-10 DIAGNOSIS — E785 Hyperlipidemia, unspecified: Secondary | ICD-10-CM | POA: Diagnosis not present

## 2012-11-10 DIAGNOSIS — J45901 Unspecified asthma with (acute) exacerbation: Secondary | ICD-10-CM | POA: Diagnosis not present

## 2012-11-10 DIAGNOSIS — K921 Melena: Secondary | ICD-10-CM | POA: Diagnosis not present

## 2012-11-10 LAB — CULTURE, RESPIRATORY W GRAM STAIN

## 2012-11-10 MED ORDER — COLCHICINE 0.6 MG PO TABS: 0.6000 mg | ORAL_TABLET | Freq: Every day | ORAL | Status: DC

## 2012-11-10 MED ORDER — LEVOFLOXACIN 750 MG PO TABS: 750.0000 mg | ORAL_TABLET | Freq: Every day | ORAL | Status: DC

## 2012-11-10 MED ORDER — DSS 100 MG PO CAPS: 100.0000 mg | ORAL_CAPSULE | Freq: Two times a day (BID) | ORAL | Status: DC

## 2012-11-10 MED ORDER — TRAMADOL HCL 50 MG PO TABS: 50.0000 mg | ORAL_TABLET | Freq: Three times a day (TID) | ORAL | Status: DC | PRN

## 2012-11-10 MED ORDER — HYDROCODONE-ACETAMINOPHEN 5-325 MG PO TABS: 1.0000 | ORAL_TABLET | ORAL | Status: DC | PRN

## 2012-11-10 NOTE — Progress Notes (Signed)
Patient is set to discharge to [facility] SNF today. Patient & mother, Talbert Forest chose facility & aware of discharge today. Discharge packet in [place]. [facility] called for transport.   Clinical Social Work Department CLINICAL SOCIAL WORK PLACEMENT NOTE 11/10/2012  Patient:  ANDRICK, RUST  Account Number:  [id number] Admit date:  11/06/2012  Clinical Social Worker:  Orpah Greek  Date/time:  11/08/2012 04:02 PM  Clinical Social Work is seeking post-discharge placement for this patient at the following level of care:   SKILLED NURSING   (*CSW will update this form in Epic as items are completed)   11/08/2012  Patient/family provided with [facility] Department of Clinical Social Work's list of facilities offering this level of care within the geographic area requested by the patient (or if unable, by the patient's family).  11/08/2012  Patient/family informed of their freedom to choose among providers that offer the needed level of care, that participate in Medicare, Medicaid or managed care program needed by the patient, have an available bed and are willing to accept the patient.  11/08/2012  Patient/family informed of [facility]' ownership interest in [facility], as well as of the fact that they are under no obligation to receive care at this facility.  PASARR submitted to EDS on 11/08/2012 PASARR number received from EDS on 11/08/2012  FL2 transmitted to all facilities in geographic area requested by pt/family on  11/08/2012 FL2 transmitted to all facilities within larger geographic area on   Patient informed that his/her managed care company has contracts with or will negotiate with  certain facilities, including the following:     Patient/family informed of bed offers received:  11/10/2012 Patient chooses bed at [facility] Physician recommends and patient chooses bed at    Patient to be transferred to [facility] on   11/10/2012 Patient to be transferred to facility by [facility]  The following physician request were entered in Epic:   Additional Comments:   Unice Bailey, LCSW [facility] Clinical Social Worker cell #: [phone]

## 2012-11-10 NOTE — Progress Notes (Signed)
Left lower extremity venous duplex:  No obvious evidence of DVT, superficial thrombosis, or Baker's cyst.  Right:  Negative for DVT in the common femoral vein.  Technically difficult study due to the patient's body habitus.

## 2012-11-10 NOTE — Progress Notes (Signed)
IV and tele monitor d/c; pt awaiting lower ext dopplers prior to d/c; if doppler negative pt may d/c to SNF; will cont. To monitor.

## 2012-11-10 NOTE — Progress Notes (Signed)
ANTIBIOTIC CONSULT NOTE - Follow up  Pharmacy Consult for vancomycin, cefepime Indication: rule out pneumonia  Allergies  Allergen Reactions  . Ibuprofen Itching    Patient Measurements: Height: 6\' 2"  (188 cm) (per patient report) Weight: 322 lb 1.5 oz (146.1 kg) IBW/kg (Calculated) : 82.2  Vital Signs: Temp: 98.5 F (36.9 C) (05/19 0402) Temp src: Oral (05/19 0402) BP: 144/97 mmHg (05/19 0402) Pulse Rate: 98 (05/19 0402) Intake/Output from previous day: 05/18 0701 - 05/19 0700 In: 4300 [P.O.:850; IV Piggyback:3450] Out: 3800 [Urine:3800] Intake/Output from this shift:    Labs:  Recent Labs  11/08/12 0410 11/09/12 0420  WBC 10.5 9.2  HGB 12.5* 12.4*  PLT 146* 156  CREATININE 1.06 0.82   Estimated Creatinine Clearance: 160.7 ml/min (by C-G formula based on Cr of 0.82). No results found for this basename: VANCOTROUGH, Leodis Binet, VANCORANDOM, GENTTROUGH, GENTPEAK, GENTRANDOM, TOBRATROUGH, TOBRAPEAK, TOBRARND, AMIKACINPEAK, AMIKACINTROU, AMIKACIN,  in the last 72 hours   Microbiology: Recent Results (from the past 720 hour(s))  URINE CULTURE     Status: None   Collection Time    11/06/12 10:22 PM      Result Value Range Status   Specimen Description URINE, CLEAN CATCH   Final   Special Requests NONE   Final   Culture  Setup Time 11/07/2012 00:13   Final   Colony Count NO GROWTH   Final   Culture NO GROWTH   Final   Report Status 11/08/2012 FINAL   Final  CULTURE, BLOOD (ROUTINE X 2)     Status: None   Collection Time    11/06/12 10:45 PM      Result Value Range Status   Specimen Description BLOOD RIGHT HAND   Final   Special Requests     Final   Value: BOTTLES DRAWN AEROBIC AND ANAEROBIC 10CC BLUE 5CC RED   Culture  Setup Time 11/07/2012 04:29   Final   Culture     Final   Value:        BLOOD CULTURE RECEIVED NO GROWTH TO DATE CULTURE WILL BE HELD FOR 5 DAYS BEFORE ISSUING A FINAL NEGATIVE REPORT   Report Status PENDING   Incomplete  CULTURE, BLOOD  (ROUTINE X 2)     Status: None   Collection Time    11/06/12 11:11 PM      Result Value Range Status   Specimen Description BLOOD LEFT HAND   Final   Special Requests BOTTLES DRAWN AEROBIC ONLY 5CC   Final   Culture  Setup Time 11/07/2012 04:29   Final   Culture     Final   Value:        BLOOD CULTURE RECEIVED NO GROWTH TO DATE CULTURE WILL BE HELD FOR 5 DAYS BEFORE ISSUING A FINAL NEGATIVE REPORT   Report Status PENDING   Incomplete  CULTURE, EXPECTORATED SPUTUM-ASSESSMENT     Status: None   Collection Time    11/07/12  7:37 PM      Result Value Range Status   Specimen Description SPUTUM   Final   Special Requests Normal   Final   Sputum evaluation     Final   Value: THIS SPECIMEN IS ACCEPTABLE. RESPIRATORY CULTURE REPORT TO FOLLOW.   Report Status 11/07/2012 FINAL   Final  CULTURE, RESPIRATORY (NON-EXPECTORATED)     Status: None   Collection Time    11/07/12  7:37 PM      Result Value Range Status   Specimen Description SPUTUM   Final  Special Requests NONE   Final   Gram Stain     Final   Value: RARE WBC PRESENT, PREDOMINANTLY PMN     RARE SQUAMOUS EPITHELIAL CELLS PRESENT     RARE GRAM NEGATIVE RODS     RARE GRAM POSITIVE COCCI     IN PAIRS   Culture NORMAL OROPHARYNGEAL FLORA   Final   Report Status PENDING   Incomplete    Medical History: Past Medical History  Diagnosis Date  . Hypertension   . Asthma   . Hyperlipemia     Medications:  Scheduled:  . antiseptic oral rinse  15 mL Mouth Rinse BID  . ceFEPime (MAXIPIME) IV  2 g Intravenous Q8H  . colchicine  0.6 mg Oral Daily  . docusate sodium  100 mg Oral BID  . enoxaparin (LOVENOX) injection  0.5 mg/kg Subcutaneous Q24H  . indomethacin  25 mg Oral BID WC  . mometasone-formoterol  2 puff Inhalation BID  . montelukast  10 mg Oral QHS  . NIFEdipine  30 mg Oral Daily  . pantoprazole  40 mg Oral Daily  . sodium chloride  3 mL Intravenous Q12H  . vancomycin  1,500 mg Intravenous Q12H   Assessment: 52 yo male  presented with L foot and ankle pain, receiving day #4 vanc/cefepime for pneumonia. All cx ngtd, remains afebrile and renal function stable.   Goal of Therapy:  Vancomycin trough level 15-20 mcg/ml  Plan:  Continue vanc and cefepime at current doses. Will check trough in am.  Verlene Mayer, PharmD, BCPS Pager [phone] 11/10/2012,8:44 AM

## 2012-11-10 NOTE — Discharge Summary (Signed)
Physician Discharge Summary  Roberto Horne ZOX:[id number] DOB: 12/17/1960 DOA: 11/06/2012  PCP: Roberto Shaggy, MD  Admit date: 11/06/2012 Discharge date: 11/10/2012  Time spent: 35 minutes  Recommendations for Outpatient Follow-up:  1. Need to follow up with PCP for management of medical problems.   Discharge Diagnoses:    PNA (pneumonia)   Left foot pain ? Gout.    HTN (hypertension)   Hyperlipidemia   Discharge Condition: Stable  Diet recommendation: Heart Healthy  Filed Weights   11/07/12 0229  Weight: 146.1 kg (322 lb 1.5 oz)    History of present illness:  Roberto Horne is an 52 y.o. male with hx of HTN, hyperlipidemia, asthma, presents to the ER via EMS as left his ankle and his left foot have been more painful. He said he was playing basket ball and another player sat on it. He was evaluated with xray via his PCP and no Fx was seen. EMS personnel also commented that his living condition at home was suboptimal. He lives with his mother. Social service was consulted in the ER, and initiated home health aid upon discharge. Further evaluation included a CXR which showed possible PNA in the retrocardiac region, with some vascular congestion. He has leukocytosis with WBC of 13K, and normal renal fx tests. He admitted to having green productive coughs for the past few days. His left ankle and toe are swollen and red, no calf tenderness. X ray of the left knee was negative. Hospitalist was asked to admitted him for PNA.   Hospital Course:  Patient was admitted for treatment of PNA. He was also complaining of left foot pain after trauma. PCP did x ray negative for fracture. He was treated for presume gout with indomethacin and colchicine. Patient will benefit from SNF. He also doesn't have good home environment. If he is discharge from SNF he will need SW send to his house.   1. PNA: Patient received Vancomycin and cefepime for 5 days.. WBC trending down. Sputum culture normal  oropharyngeal flora. Will discharge him on Levaquin for 5 more days.   2. Left foot pain: Uric acid very mild elevated. Less likely infection no significant redness. Could be gout, pseudogout, or inflammation after trauma. Antibiotics should cover for infection. Continue with colchicine. Received indomethacin for 3 days.  Pain has improved. Relates pain left thig. Will check dopplers rule out DVT.  3. HTN; Continue with nifedipine.  4.   Procedures:  Doppler pending: if negative for DVT will discharge patient today.   Consultations:  None  Discharge Exam: Filed Vitals:   11/09/12 1402 11/09/12 2005 11/10/12 0402 11/10/12 0759  BP: 126/80 148/90 144/97   Pulse: 87 90 98   Temp: 97.9 F (36.6 C) 98 F (36.7 C) 98.5 F (36.9 C)   TempSrc: Oral Oral Oral   Resp: 22 19 20    Height:      Weight:      SpO2: 96% 97% 98% 98%    General: No distress.  Cardiovascular: S 1, S 2 RRR Respiratory: CTA Left LE with no pain on palpation left ankle, no significant swelling.   Discharge Instructions  Discharge Orders   Future Orders Complete By Expires     Diet - low sodium heart healthy  As directed     Face-to-face encounter (required for Medicare/Medicaid patients)  As directed     Comments:      I Muthersbaugh, Dahlia Client certify that this patient is under my care and that I, or a nurse  practitioner or physician's assistant working with me, had a face-to-face encounter that meets the physician face-to-face encounter requirements with this patient on 11/06/2012. The encounter with the patient was in whole, or in part for the following medical condition(s) which is the primary reason for home health care (List medical condition): L knee and ankle pain after fall causing inability to ambulate and thus care for himself    Questions:      The encounter with the patient was in whole, or in part, for the following medical condition, which is the primary reason for home health care:  inability to walk     I certify that, based on my findings, the following services are medically necessary home health services:  Physical therapy    Nursing    My clinical findings support the need for the above services:  Pain interferes with ambulation/mobility    Further, I certify that my clinical findings support that this patient is homebound due to:  Pain interferes with ambulation/mobility    Reason for Medically Necessary Home Health Services:  Therapy- Home Adaptation to Facilitate Safety    Home Health  As directed     Questions:      To provide the following care/treatments:  PT    OT    RN    Home Health Aide    Social work    Increase activity slowly  As directed         Medication List    TAKE these medications       colchicine 0.6 MG tablet  Take 1 tablet (0.6 mg total) by mouth daily.     DSS 100 MG Caps  Take 100 mg by mouth 2 (two) times daily.     fluticasone-salmeterol 115-21 MCG/ACT inhaler  Commonly known as:  ADVAIR HFA  Inhale 2 puffs into the lungs 2 (two) times daily.     HYDROcodone-acetaminophen 5-325 MG per tablet  Commonly known as:  NORCO/VICODIN  Take 1 tablet by mouth every 4 (four) hours as needed for pain.     levofloxacin 750 MG tablet  Commonly known as:  LEVAQUIN  Take 1 tablet (750 mg total) by mouth daily.     montelukast 10 MG tablet  Commonly known as:  SINGULAIR  Take 10 mg by mouth at bedtime.     NIFEdipine 30 MG 24 hr tablet  Commonly known as:  PROCARDIA-XL/ADALAT CC  Take 30 mg by mouth daily.     omeprazole 20 MG capsule  Commonly known as:  PRILOSEC  Take 20 mg by mouth 2 (two) times daily.       Allergies  Allergen Reactions  . Ibuprofen Itching       Follow-up Information   Follow up with Roberto Shaggy, MD In 1 week.   Contact information:   [address]  [facility] [place] [address] [phone]        The results of significant diagnostics from this hospitalization (including imaging,  microbiology, ancillary and laboratory) are listed below for reference.    Significant Diagnostic Studies: Dg Chest 2 View  11/06/2012   *RADIOLOGY REPORT*  Clinical Data: Fever, tachycardia, tachypnea  CHEST - 2 VIEW  Comparison: 07/24/2012  Findings: Heart size upper normal to mildly enlarged.  Central vascular congestion.  Mild increased perihilar markings and peribronchial thickening.  Retrocardiac opacity.  No pneumothorax. No acute osseous finding.  IMPRESSION: Retrocardiac opacity is concerning for pneumonia given the clinical presentation.  Increased perihilar markings  and peribronchial thickening may reflect bronchitic change versus mild edema.   Original Report Authenticated By: Jearld Lesch, M.D.   Dg Knee Complete 4 Views Left  11/06/2012   *RADIOLOGY REPORT*  Clinical Data: Status post fall Oct 31, 2011.  Left knee pain.  LEFT KNEE - COMPLETE 4+ VIEW  Comparison: Plain films 02/21/2009.  Findings: No acute bony or joint abnormality is identified.  There is degenerative change about the knee most notable in the medial compartment.  Small joint effusion is seen.  IMPRESSION: No acute finding.  Degenerative change of the medial compartment.   Original Report Authenticated By: Holley Dexter, M.D.   Dg Foot Complete Left  11/04/2012   *RADIOLOGY REPORT*  Clinical Data: Left foot pain without known injury.  LEFT FOOT - COMPLETE 3+ VIEW  Comparison: None.  Findings: Soft tissues of the foot appear swollen.  No bony or joint abnormality is identified.  No radiopaque foreign body is seen.  IMPRESSION: Soft tissue swelling.  Otherwise negative.   Original Report Authenticated By: Holley Dexter, M.D.    Microbiology: Recent Results (from the past 240 hour(s))  URINE CULTURE     Status: None   Collection Time    11/06/12 10:22 PM      Result Value Range Status   Specimen Description URINE, CLEAN CATCH   Final   Special Requests NONE   Final   Culture  Setup Time 11/07/2012 00:13    Final   Colony Count NO GROWTH   Final   Culture NO GROWTH   Final   Report Status 11/08/2012 FINAL   Final  CULTURE, BLOOD (ROUTINE X 2)     Status: None   Collection Time    11/06/12 10:45 PM      Result Value Range Status   Specimen Description BLOOD RIGHT HAND   Final   Special Requests     Final   Value: BOTTLES DRAWN AEROBIC AND ANAEROBIC 10CC BLUE 5CC RED   Culture  Setup Time 11/07/2012 04:29   Final   Culture     Final   Value:        BLOOD CULTURE RECEIVED NO GROWTH TO DATE CULTURE WILL BE HELD FOR 5 DAYS BEFORE ISSUING A FINAL NEGATIVE REPORT   Report Status PENDING   Incomplete  CULTURE, BLOOD (ROUTINE X 2)     Status: None   Collection Time    11/06/12 11:11 PM      Result Value Range Status   Specimen Description BLOOD LEFT HAND   Final   Special Requests BOTTLES DRAWN AEROBIC ONLY 5CC   Final   Culture  Setup Time 11/07/2012 04:29   Final   Culture     Final   Value:        BLOOD CULTURE RECEIVED NO GROWTH TO DATE CULTURE WILL BE HELD FOR 5 DAYS BEFORE ISSUING A FINAL NEGATIVE REPORT   Report Status PENDING   Incomplete  CULTURE, EXPECTORATED SPUTUM-ASSESSMENT     Status: None   Collection Time    11/07/12  7:37 PM      Result Value Range Status   Specimen Description SPUTUM   Final   Special Requests Normal   Final   Sputum evaluation     Final   Value: THIS SPECIMEN IS ACCEPTABLE. RESPIRATORY CULTURE REPORT TO FOLLOW.   Report Status 11/07/2012 FINAL   Final  CULTURE, RESPIRATORY (NON-EXPECTORATED)     Status: None   Collection Time    11/07/12  7:37 PM      Result Value Range Status   Specimen Description SPUTUM   Final   Special Requests NONE   Final   Gram Stain     Final   Value: RARE WBC PRESENT, PREDOMINANTLY PMN     RARE SQUAMOUS EPITHELIAL CELLS PRESENT     RARE GRAM NEGATIVE RODS     RARE GRAM POSITIVE COCCI     IN PAIRS   Culture NORMAL OROPHARYNGEAL FLORA   Final   Report Status PENDING   Incomplete     Labs: Basic Metabolic  Panel:  Recent Labs Lab 11/06/12 2041 11/07/12 0239 11/08/12 0410 11/09/12 0420  NA 137  --  136 135  K 4.2  --  4.7 4.4  CL 97  --  102 101  CO2 30  --  29 28  GLUCOSE 120*  --  112* 111*  BUN 21  --  22 16  CREATININE 1.16 0.94 1.06 0.82  CALCIUM 10.0  --  8.7 9.0   Liver Function Tests: No results found for this basename: AST, ALT, ALKPHOS, BILITOT, PROT, ALBUMIN,  in the last 168 hours No results found for this basename: LIPASE, AMYLASE,  in the last 168 hours No results found for this basename: AMMONIA,  in the last 168 hours CBC:  Recent Labs Lab 11/06/12 2041 11/07/12 0239 11/08/12 0410 11/09/12 0420  WBC 13.6* 12.4* 10.5 9.2  NEUTROABS 10.0*  --   --   --   HGB 15.5 14.1 12.5* 12.4*  HCT 45.5 42.7 39.4 38.5*  MCV 74.5* 74.9* 76.7* 76.4*  PLT 157 151 146* 156   Cardiac Enzymes: No results found for this basename: CKTOTAL, CKMB, CKMBINDEX, TROPONINI,  in the last 168 hours BNP: BNP (last 3 results) No results found for this basename: PROBNP,  in the last 8760 hours CBG:  Recent Labs Lab 11/08/12 1629  GLUCAP 100*       Signed:  Jaynee Winters  [facility] 11/10/2012, 9:44 AM

## 2012-11-11 ENCOUNTER — Non-Acute Institutional Stay (SKILLED_NURSING_FACILITY): Payer: Medicare Other | Admitting: Internal Medicine

## 2012-11-11 DIAGNOSIS — J45901 Unspecified asthma with (acute) exacerbation: Secondary | ICD-10-CM

## 2012-11-11 DIAGNOSIS — J189 Pneumonia, unspecified organism: Secondary | ICD-10-CM

## 2012-11-11 DIAGNOSIS — M109 Gout, unspecified: Secondary | ICD-10-CM

## 2012-11-13 LAB — CULTURE, BLOOD (ROUTINE X 2)
Culture: NO GROWTH
Culture: NO GROWTH

## 2012-11-14 ENCOUNTER — Other Ambulatory Visit: Payer: Self-pay | Admitting: *Deleted

## 2012-11-14 MED ORDER — HYDROCODONE-ACETAMINOPHEN 5-325 MG PO TABS: ORAL_TABLET | ORAL | Status: DC

## 2012-11-20 ENCOUNTER — Non-Acute Institutional Stay (SKILLED_NURSING_FACILITY): Payer: Medicare Other | Admitting: Adult Health

## 2012-11-20 DIAGNOSIS — K921 Melena: Secondary | ICD-10-CM

## 2012-11-20 DIAGNOSIS — K5900 Constipation, unspecified: Secondary | ICD-10-CM

## 2012-11-24 NOTE — Progress Notes (Signed)
Patient ID: Roberto Horne, male   DOB: 04-20-1961, 52 y.o.   MRN: [id number]           HISTORY & PHYSICAL  DATE:  11/11/2012  FACILITY: [facility]   LEVEL OF CARE:   SNF   CHIEF COMPLAINT:  Status post admission to [facility], 11/06/2012 through 11/10/2012.  This is an admission to SNF.    HISTORY OF PRESENT ILLNESS:  This is a 52 year-old man who arrived in the ER with complaints of left foot and ankle pain.  He was  apparently playing basketball and had some trauma.  He had some x-rays done.  No fractures were seen.    He  apparently was also found to be living in suboptimal living conditions with his mother.    In the ER, however, a chest x-ray showed possible pneumonia in the retrocardiac region with some vascular congestion.  His white count was 13,000.  Renal function tests were normal.  He was admitted for PNA.  He was treated with vancomycin and cefepime.  Sputum cultures showed oropharyngeal flora.  He was discharged on Levaquin.    With regards to the foot pain, uric acid level was mildly elevated.  Could be gout, pseudogout, or inflammation after trauma.  He was given colchicine and indomethacin for three days with the pain improving.  A duplex ultrasound was negative in the left leg for DVT.      PAST MEDICAL HISTORY/PROBLEM LIST:  Pneumonia.    Left foot pain, question gout.    Hypertension.    Hyperlipidemia.    Long history of asthma for which he uses nebulizers and inhalers, although I have no information on this.    CURRENT MEDICATIONS:   Colchicine 0.6 q.d.   Advair Diskus 115/21, 2 puffs two times daily.    Hydrocodone 1 tab by mouth every 4 hours as needed.    Levaquin 750 q.d.   Singulair 10 q.d.   Nifedipine XL 30 q.d.   Omeprazole 20 b.i.d.   SOCIAL HISTORY: HOUSING:  The patient states he lives with his mother on [address]  There is a comment in his discharge summary that his living situation was "suboptimal".  Social Services  consulted in the ER and  apparently a home health aide was being arranged.  However, they elected to admit him.   TOBACCO USE:  He is not a smoker.   EMPLOYMENT HISTORY:  States he goes to [employer] for classes.    REVIEW OF SYSTEMS:   CHEST/RESPIRATORY:  Complains of wheezing.   CARDIAC:   No chest pain.  GI:  No nausea, vomiting or diarrhea.  MUSCULOSKELETAL:  Still complaining of left leg pain in the knee, ankle and foot.    PHYSICAL EXAMINATION:   VITAL SIGNS:   PULSE:  86.   RESPIRATIONS:  20.   GENERAL APPEARANCE:  Morbidly obese, 52 year-old man in no distress.   CHEST/RESPIRATORY:  There is a prolonged expiratory phase with expiratory wheezing.   CARDIOVASCULAR:  CARDIAC:   Heart sounds are normal.  There are no signs of congestive heart failure.   GASTROINTESTINAL:  ABDOMEN:   Morbidly obese.   LIVER/SPLEEN/KIDNEYS:  No liver, no spleen.  No tenderness.   GENITOURINARY:  BLADDER:   Not distended.  There is no costovertebral angle tenderness.  CIRCULATION:  EDEMA/VARICOSITIES:  There is edema of his left leg.   ARTERIAL:  Peripheral pulses are difficult to feel.   MUSCULOSKELETAL:   EXTREMITIES:  LEFT  LOWER EXTREMITY:  He has point tenderness over the left first metatarsophalangeal joint, significant tenderness in the left knee and ankle.  There is probably some fluid in the left knee, although this would not be easy to aspirate.   NEUROLOGICAL:    BALANCE/GAIT:  I did not attempt to ambulate him.  He said his foot is just too sore.   PSYCHIATRIC:   MENTAL STATUS:   The patient is conversational.  He will respond to questions directly.  I wonder about a slight degree of mental retardation.      ASSESSMENT/PLAN:  Community-acquired pneumonia.   He apparently has resolved from this.  Certainly not having any fever.     Asthma.  I think this is probably active.  He is going to need additional treatment.  He has prolonged expiratory phase.  Gout, left leg, if indeed this is  what it is.  His uric acid level was 8.5 presumably during an acute attack in the hospital.  Actually, his uric acid might be higher than this.  I am going to put him back on NSAIDs.  There does not appear to be any good reason that he could not tolerate a course of this.  He was on indomethacin in the hospital.    Hypertension.  We will monitor while he is here.    CPT CODE: [address]

## 2012-11-26 ENCOUNTER — Non-Acute Institutional Stay (SKILLED_NURSING_FACILITY): Payer: Medicare Other | Admitting: Internal Medicine

## 2012-11-26 DIAGNOSIS — M109 Gout, unspecified: Secondary | ICD-10-CM | POA: Diagnosis not present

## 2012-11-26 DIAGNOSIS — K219 Gastro-esophageal reflux disease without esophagitis: Secondary | ICD-10-CM

## 2012-11-26 DIAGNOSIS — I10 Essential (primary) hypertension: Secondary | ICD-10-CM

## 2012-11-26 DIAGNOSIS — J45909 Unspecified asthma, uncomplicated: Secondary | ICD-10-CM | POA: Diagnosis not present

## 2012-12-01 DIAGNOSIS — Z8701 Personal history of pneumonia (recurrent): Secondary | ICD-10-CM | POA: Diagnosis not present

## 2012-12-01 DIAGNOSIS — Z9981 Dependence on supplemental oxygen: Secondary | ICD-10-CM | POA: Diagnosis not present

## 2012-12-01 DIAGNOSIS — R41841 Cognitive communication deficit: Secondary | ICD-10-CM | POA: Diagnosis not present

## 2012-12-01 DIAGNOSIS — I10 Essential (primary) hypertension: Secondary | ICD-10-CM | POA: Diagnosis not present

## 2012-12-01 DIAGNOSIS — J449 Chronic obstructive pulmonary disease, unspecified: Secondary | ICD-10-CM | POA: Diagnosis not present

## 2012-12-01 DIAGNOSIS — G3184 Mild cognitive impairment, so stated: Secondary | ICD-10-CM | POA: Diagnosis not present

## 2012-12-01 DIAGNOSIS — E782 Mixed hyperlipidemia: Secondary | ICD-10-CM | POA: Diagnosis not present

## 2012-12-01 DIAGNOSIS — M6281 Muscle weakness (generalized): Secondary | ICD-10-CM | POA: Diagnosis not present

## 2012-12-02 DIAGNOSIS — J449 Chronic obstructive pulmonary disease, unspecified: Secondary | ICD-10-CM | POA: Diagnosis not present

## 2012-12-02 DIAGNOSIS — I10 Essential (primary) hypertension: Secondary | ICD-10-CM | POA: Diagnosis not present

## 2012-12-02 DIAGNOSIS — Z8701 Personal history of pneumonia (recurrent): Secondary | ICD-10-CM | POA: Diagnosis not present

## 2012-12-02 DIAGNOSIS — E782 Mixed hyperlipidemia: Secondary | ICD-10-CM | POA: Diagnosis not present

## 2012-12-02 DIAGNOSIS — R41841 Cognitive communication deficit: Secondary | ICD-10-CM | POA: Diagnosis not present

## 2012-12-02 DIAGNOSIS — M6281 Muscle weakness (generalized): Secondary | ICD-10-CM | POA: Diagnosis not present

## 2012-12-04 DIAGNOSIS — R41841 Cognitive communication deficit: Secondary | ICD-10-CM | POA: Diagnosis not present

## 2012-12-04 DIAGNOSIS — J449 Chronic obstructive pulmonary disease, unspecified: Secondary | ICD-10-CM | POA: Diagnosis not present

## 2012-12-04 DIAGNOSIS — M6281 Muscle weakness (generalized): Secondary | ICD-10-CM | POA: Diagnosis not present

## 2012-12-04 DIAGNOSIS — E782 Mixed hyperlipidemia: Secondary | ICD-10-CM | POA: Diagnosis not present

## 2012-12-04 DIAGNOSIS — Z8701 Personal history of pneumonia (recurrent): Secondary | ICD-10-CM | POA: Diagnosis not present

## 2012-12-04 DIAGNOSIS — I10 Essential (primary) hypertension: Secondary | ICD-10-CM | POA: Diagnosis not present

## 2012-12-09 DIAGNOSIS — E782 Mixed hyperlipidemia: Secondary | ICD-10-CM | POA: Diagnosis not present

## 2012-12-09 DIAGNOSIS — J449 Chronic obstructive pulmonary disease, unspecified: Secondary | ICD-10-CM | POA: Diagnosis not present

## 2012-12-09 DIAGNOSIS — I10 Essential (primary) hypertension: Secondary | ICD-10-CM | POA: Diagnosis not present

## 2012-12-09 DIAGNOSIS — Z8701 Personal history of pneumonia (recurrent): Secondary | ICD-10-CM | POA: Diagnosis not present

## 2012-12-09 DIAGNOSIS — R41841 Cognitive communication deficit: Secondary | ICD-10-CM | POA: Diagnosis not present

## 2012-12-09 DIAGNOSIS — M6281 Muscle weakness (generalized): Secondary | ICD-10-CM | POA: Diagnosis not present

## 2012-12-15 DIAGNOSIS — I10 Essential (primary) hypertension: Secondary | ICD-10-CM | POA: Diagnosis not present

## 2012-12-15 DIAGNOSIS — R41841 Cognitive communication deficit: Secondary | ICD-10-CM | POA: Diagnosis not present

## 2012-12-15 DIAGNOSIS — K219 Gastro-esophageal reflux disease without esophagitis: Secondary | ICD-10-CM | POA: Insufficient documentation

## 2012-12-15 DIAGNOSIS — J4530 Mild persistent asthma, uncomplicated: Secondary | ICD-10-CM | POA: Insufficient documentation

## 2012-12-15 DIAGNOSIS — M109 Gout, unspecified: Secondary | ICD-10-CM | POA: Insufficient documentation

## 2012-12-15 DIAGNOSIS — J449 Chronic obstructive pulmonary disease, unspecified: Secondary | ICD-10-CM | POA: Diagnosis not present

## 2012-12-15 DIAGNOSIS — E782 Mixed hyperlipidemia: Secondary | ICD-10-CM | POA: Diagnosis not present

## 2012-12-15 DIAGNOSIS — Z8701 Personal history of pneumonia (recurrent): Secondary | ICD-10-CM | POA: Diagnosis not present

## 2012-12-15 DIAGNOSIS — M6281 Muscle weakness (generalized): Secondary | ICD-10-CM | POA: Diagnosis not present

## 2012-12-15 NOTE — Progress Notes (Signed)
Patient ID: Roberto Horne, male   DOB: Jun 17, 1961, 52 y.o.   MRN: [id number]        PROGRESS NOTE  DATE: 11/26/2012   FACILITY: [facility]  LEVEL OF CARE: SNF (31)  Discharge Visit  CHIEF COMPLAINT:  Manage hypertension and asthma.    HISTORY OF PRESENT ILLNESS: I was requested by the social worker to perform face-to-face evaluation for discharge:  Patient was admitted to this facility for short-term rehabilitation after the patient's recent hospitalization.  Patient has completed SNF rehabilitation and therapy has cleared the patient for discharge.  Patient was hospitalized for a pneumonia and then was admitted to this facility.     Reassessment of ongoing problem(s):  HTN: Pt 's HTN remains stable.  Denies CP, sob, DOE, headaches, dizziness or visual disturbances.  He has  chronic lower extremity swelling.  No complications from the medications currently being used.  Last BP :  136/70.  ASTHMA: The patient's asthma remains stable. Patient denies shortness of breath, dyspnea on exertion or wheezing. No complications reported from the medications currently being used.    PAST MEDICAL HISTORY : Reviewed.  No changes.  CURRENT MEDICATIONS: Reviewed per [facility]  REVIEW OF SYSTEMS:  GENERAL: no change in appetite, no fatigue, no weight changes, no fever, chills or weakness RESPIRATORY: no cough, SOB, DOE, wheezing, hemoptysis CARDIAC: no chest pain, edema or palpitations GI: no abdominal pain, diarrhea, constipation, heart burn, nausea or vomiting  PHYSICAL EXAMINATION  VS:  T  97.1     P 72      RR 18     BP 136/70     POX %       WT (Lb) 330  GENERAL: no acute distress, morbidly obese body habitus NECK: supple, trachea midline, no neck masses, no thyroid tenderness, no thyromegaly RESPIRATORY: breathing is even & unlabored, BS CTAB CARDIAC: RRR, no murmur,no extra heart sounds EDEMA/VARICOSITIES:  +2 bilateral lower extremity edema  ARTERIAL:  pedal pulses  nonpalpable   GI: abdomen soft, normal BS, no masses, no tenderness, no hepatomegaly, no splenomegaly PSYCHIATRIC: the patient is alert & oriented to person, affect & behavior appropriate  LABS/RADIOLOGY: None done in the facility.  ASSESSMENT/PLAN:  Hypertension.  Well controlled.    Asthma.  Well compensated.  Gout.  Continue colchicine.    GERD.  Continue Prilosec.  Denies ongoing symptoms.   Constipation.  Continue Colace.  Denies ongoing symptoms.    I have filled out patient's discharge paperwork and written prescriptions.  Patient will receive home health PT, OT, ST, and nursing . DME provided:  None.   Discharge Date:  11/30/2012.    Total discharge time: Less than 30 minutes Discharge time involved coordination of the discharge process with Child psychotherapist, nursing staff and therapy department. Medical justification for home health services/DME verified.  CPT CODE: 78295

## 2012-12-18 DIAGNOSIS — E782 Mixed hyperlipidemia: Secondary | ICD-10-CM | POA: Diagnosis not present

## 2012-12-18 DIAGNOSIS — J449 Chronic obstructive pulmonary disease, unspecified: Secondary | ICD-10-CM | POA: Diagnosis not present

## 2012-12-18 DIAGNOSIS — R41841 Cognitive communication deficit: Secondary | ICD-10-CM | POA: Diagnosis not present

## 2012-12-18 DIAGNOSIS — Z8701 Personal history of pneumonia (recurrent): Secondary | ICD-10-CM | POA: Diagnosis not present

## 2012-12-18 DIAGNOSIS — M6281 Muscle weakness (generalized): Secondary | ICD-10-CM | POA: Diagnosis not present

## 2012-12-18 DIAGNOSIS — I10 Essential (primary) hypertension: Secondary | ICD-10-CM | POA: Diagnosis not present

## 2012-12-26 DIAGNOSIS — M6281 Muscle weakness (generalized): Secondary | ICD-10-CM | POA: Diagnosis not present

## 2012-12-26 DIAGNOSIS — I10 Essential (primary) hypertension: Secondary | ICD-10-CM | POA: Diagnosis not present

## 2012-12-26 DIAGNOSIS — Z8701 Personal history of pneumonia (recurrent): Secondary | ICD-10-CM | POA: Diagnosis not present

## 2012-12-26 DIAGNOSIS — E782 Mixed hyperlipidemia: Secondary | ICD-10-CM | POA: Diagnosis not present

## 2012-12-26 DIAGNOSIS — R41841 Cognitive communication deficit: Secondary | ICD-10-CM | POA: Diagnosis not present

## 2012-12-26 DIAGNOSIS — J449 Chronic obstructive pulmonary disease, unspecified: Secondary | ICD-10-CM | POA: Diagnosis not present

## 2012-12-30 DIAGNOSIS — R000 Tachycardia, unspecified: Secondary | ICD-10-CM | POA: Diagnosis not present

## 2012-12-31 DIAGNOSIS — Z0000 Encounter for general adult medical examination without abnormal findings: Secondary | ICD-10-CM | POA: Diagnosis not present

## 2013-01-02 DIAGNOSIS — Z8701 Personal history of pneumonia (recurrent): Secondary | ICD-10-CM | POA: Diagnosis not present

## 2013-01-02 DIAGNOSIS — J449 Chronic obstructive pulmonary disease, unspecified: Secondary | ICD-10-CM | POA: Diagnosis not present

## 2013-01-02 DIAGNOSIS — M6281 Muscle weakness (generalized): Secondary | ICD-10-CM | POA: Diagnosis not present

## 2013-01-02 DIAGNOSIS — R41841 Cognitive communication deficit: Secondary | ICD-10-CM | POA: Diagnosis not present

## 2013-01-02 DIAGNOSIS — E782 Mixed hyperlipidemia: Secondary | ICD-10-CM | POA: Diagnosis not present

## 2013-01-02 DIAGNOSIS — I10 Essential (primary) hypertension: Secondary | ICD-10-CM | POA: Diagnosis not present

## 2013-05-26 NOTE — Progress Notes (Signed)
Patient ID: Roberto Horne, male   DOB: [birth year], 52 y.o.   MRN: [id number]     [facility]  Allergies  Allergen Reactions  . Ibuprofen Itching   Chief Complaint  Patient presents with  . Acute Visit    bloody stools      HPI  He is having bright red blood in his stools; he is also complaining of constipation with stools being very hard. There are no complaints of chest pain or shortness of breath present. He states the bleeding started today.   Past Medical History  Diagnosis Date  . Hypertension   . Asthma   . Hyperlipemia     No past surgical history on file.  Filed Vitals:   11/20/12 1416  BP: 132/70  Pulse: 78  Height: 6\' 2"  (1.88 m)  Weight: 322 lb (146.058 kg)    MEDICATIONS  symbicaort 160/4.5 mcg 2 puffs twice daily voltaren xr 100 mg daily for 10 days (11-11-12)  Colchicine 0.6 mg dialy Colace twice daily vicodin 5/325 mg every 6 hours as needed singulair 10 mg daily prilosec 20 mg twice daily  Procardia er 30 mg daily    LABS REVIEWED  11-19-12: wbc 9.2; hgb 12.4; hct 38.5; mcv 76.4;plt 156; glucose 111;bun 16;creat 0.82; k+4.4;na++135    Review of Systems  Constitutional: Negative for malaise/fatigue.  Respiratory: Negative for cough.   Cardiovascular: Negative for chest pain.  Gastrointestinal: Positive for abdominal pain, constipation and blood in stool. Negative for heartburn.  Musculoskeletal: Negative for joint pain and myalgias.  Skin: Negative.   Neurological: Negative for headaches.  Psychiatric/Behavioral: Negative for depression.      Physical Exam  Constitutional: No distress.  obese  Neck: Neck supple. No JVD present.  Cardiovascular: Normal rate, regular rhythm and intact distal pulses.   Respiratory: Effort normal and breath sounds normal. No respiratory distress.  GI: Soft. Bowel sounds are normal. He exhibits no distension. There is no tenderness. There is no rebound.  Musculoskeletal: He exhibits no edema.    Neurological: He is alert.  Skin: Skin is warm and dry. He is not diaphoretic.     ASSESSMENT/PLAN  1. Constipation: will being miralax daily and will get him a GI consult for his bloody stools and will continue to monitor his status.

## 2013-07-09 ENCOUNTER — Emergency Department (HOSPITAL_COMMUNITY): Payer: Medicare Other

## 2013-07-09 ENCOUNTER — Encounter (HOSPITAL_COMMUNITY): Payer: Self-pay | Admitting: Emergency Medicine

## 2013-07-09 ENCOUNTER — Emergency Department (HOSPITAL_COMMUNITY)
Admission: EM | Admit: 2013-07-09 | Discharge: 2013-07-10 | Disposition: A | Discharge status: Home / Self Care | Payer: Medicare Other | Attending: Emergency Medicine | Admitting: Emergency Medicine

## 2013-07-09 DIAGNOSIS — J45909 Unspecified asthma, uncomplicated: Secondary | ICD-10-CM | POA: Insufficient documentation

## 2013-07-09 DIAGNOSIS — M109 Gout, unspecified: Secondary | ICD-10-CM | POA: Insufficient documentation

## 2013-07-09 DIAGNOSIS — I10 Essential (primary) hypertension: Secondary | ICD-10-CM | POA: Insufficient documentation

## 2013-07-09 DIAGNOSIS — J069 Acute upper respiratory infection, unspecified: Secondary | ICD-10-CM | POA: Insufficient documentation

## 2013-07-09 DIAGNOSIS — R05 Cough: Secondary | ICD-10-CM | POA: Diagnosis not present

## 2013-07-09 DIAGNOSIS — R059 Cough, unspecified: Secondary | ICD-10-CM | POA: Diagnosis not present

## 2013-07-09 DIAGNOSIS — M79609 Pain in unspecified limb: Secondary | ICD-10-CM | POA: Diagnosis not present

## 2013-07-09 HISTORY — DX: Gout, unspecified: M10.9

## 2013-07-09 NOTE — ED Notes (Signed)
Patient transported to X-ray 

## 2013-07-09 NOTE — ED Notes (Addendum)
Pt reports left foot pain today, hx of gout and states this pain is similar to his gout pain in past. Also reports having productive cough with green sputum, worse at night time. Was seen here back in may for same foot and ankle pain and admitted due to pneumonia. Airway intact at triage. spo2 95%.

## 2013-07-09 NOTE — ED Provider Notes (Signed)
CSN: [id number]     Arrival date & time 07/09/13  1626 History   First MD Initiated Contact with Patient 07/09/13 2241     Chief Complaint  Patient presents with  . Foot Pain  . Cough   (Consider location/radiation/quality/duration/timing/severity/associated sxs/prior Treatment) Patient is a 53 y.o. male presenting with cough and lower extremity pain. The history is provided by the patient. No language interpreter was used.  Cough Cough characteristics:  Productive Sputum characteristics:  Yellow Severity:  Moderate Onset quality:  Gradual Duration:  2 days Timing:  Intermittent Progression:  Unchanged Chronicity:  New Smoker: no   Context: not animal exposure, not exposure to allergens, not sick contacts and not weather changes   Relieved by:  Nothing Worsened by:  Nothing tried Ineffective treatments:  None tried Associated symptoms: no chest pain, no chills, no fever and no shortness of breath   Foot Pain This is a new problem. The current episode started yesterday. The problem occurs constantly. The problem has been unchanged. Associated symptoms include arthralgias, coughing and joint swelling. Pertinent negatives include no abdominal pain, chest pain, chills, fatigue, fever, nausea, neck pain, vomiting or weakness. Nothing aggravates the symptoms. He has tried nothing for the symptoms. The treatment provided no relief.    Past Medical History  Diagnosis Date  . Hypertension   . Asthma   . Hyperlipemia   . Gout    History reviewed. No pertinent past surgical history. History reviewed. No pertinent family history. History  Substance Use Topics  . Smoking status: Never Smoker   . Smokeless tobacco: Not on file  . Alcohol Use: No    Review of Systems  Constitutional: Negative for fever, chills and fatigue.  HENT: Negative for trouble swallowing.   Eyes: Negative for visual disturbance.  Respiratory: Positive for cough. Negative for shortness of breath.    Cardiovascular: Negative for chest pain and palpitations.  Gastrointestinal: Negative for nausea, vomiting, abdominal pain and diarrhea.  Genitourinary: Negative for dysuria and difficulty urinating.  Musculoskeletal: Positive for arthralgias and joint swelling. Negative for neck pain.  Skin: Negative for color change.  Neurological: Negative for dizziness and weakness.  Psychiatric/Behavioral: Negative for dysphoric mood.    Allergies  Ibuprofen  Home Medications  No current outpatient prescriptions on file. BP 151/103  Pulse 94  Temp(Src) 98 F (36.7 C) (Oral)  Resp 20  SpO2 96% Physical Exam  Nursing note and vitals reviewed. Constitutional: He is oriented to person, place, and time. He appears well-developed and well-nourished. No distress.  HENT:  Head: Normocephalic and atraumatic.  Eyes: Conjunctivae and EOM are normal.  Neck: Normal range of motion.  Cardiovascular: Normal rate and regular rhythm.  Exam reveals no gallop and no friction rub.   No murmur heard. Pulmonary/Chest: Effort normal and breath sounds normal. He has no wheezes. He has no rales. He exhibits no tenderness.  Abdominal: Soft. He exhibits no distension. There is no tenderness. There is no rebound.  Musculoskeletal:  Left ankle and foot edematous and warm to touch. First metatarsal tenderness to palpation. No obvious ankle deformity. ROM limited due to pain.   Neurological: He is alert and oriented to person, place, and time. Coordination normal.  Speech is goal-oriented. Moves limbs without ataxia.   Skin: Skin is warm and dry.  Psychiatric: He has a normal mood and affect. His behavior is normal.    ED Course  Procedures (including critical care time) Labs Review Labs Reviewed - No data to display Imaging Review  Dg Chest 2 View  07/09/2013   CLINICAL DATA:  Cough. Chest congestion. Bilateral lower extremity edema.  EXAM: CHEST  2 VIEW  COMPARISON:  11/06/2012  FINDINGS: Pulmonary  hyperinflation again seen as well as chronic central peribronchial thickening. No evidence of pulmonary infiltrate or pleural effusion. No evidence of congestive heart failure. Heart size is within normal limits.  IMPRESSION: No active cardiopulmonary disease.  Probable COPD.   Electronically Signed   By: Earle Gell M.D.   On: 07/09/2013 18:12   Dg Foot Complete Left  07/10/2013   CLINICAL DATA:  Foot pain  EXAM: LEFT FOOT - COMPLETE 3+ VIEW  COMPARISON:  11/04/2012  FINDINGS: There is no evidence of fracture or dislocation. There is no evidence of arthropathy or other focal bone abnormality. Chronic fragmentation of the medial great toes sesamoid. No soft tissue mineralization.  IMPRESSION: Negative.   Electronically Signed   By: Jorje Guild M.D.   On: 07/10/2013 00:46    EKG Interpretation   None       MDM   1. URI (upper respiratory infection)   2. Gout     10:56 PM Chest xray unremarkable for acute changes. Patient will have foot xray due to point tenderness of first metatarsal bone. Vitals stable and patient afebrile. If foot xray unremarkable, patient will be treated for gout and URI.   12:54 AM Foot xray unremarkable for acute changes. Patient likely has URI and gout. Patient will be discharged with delsym for cough and indomethacin for gout. Patient will have colchicine for gout here. Patient instructed to follow up with his PCP.   Alvina Chou, PA-C 07/10/13 [place], PA-C 07/10/13 [phone]

## 2013-07-10 MED ORDER — INDOMETHACIN 25 MG PO CAPS: 25.0000 mg | ORAL_CAPSULE | Freq: Three times a day (TID) | ORAL | Status: DC

## 2013-07-10 MED ORDER — COLCHICINE 0.6 MG PO TABS
1.2000 mg | ORAL_TABLET | Freq: Once | ORAL | Status: AC
  Administered 2013-07-10: 1.2 mg via ORAL
  Filled 2013-07-10: qty 2

## 2013-07-10 MED ORDER — DEXTROMETHORPHAN POLISTIREX 30 MG/5ML PO LQCR: 30.0000 mg | Freq: Two times a day (BID) | ORAL | Status: DC

## 2013-07-10 NOTE — Discharge Instructions (Signed)
Take Delsym as needed for cough. Take Indomethacin as directed for gout. Follow up with your doctor. Refer to attached documents for more information.

## 2013-07-10 NOTE — ED Provider Notes (Signed)
Medical screening examination/treatment/procedure(s) were performed by non-physician practitioner and as supervising physician I was immediately available for consultation/collaboration.  EKG Interpretation   None         Osvaldo Shipper, MD 07/10/13 [phone]

## 2013-07-10 NOTE — ED Notes (Signed)
Patient getting dressed and calling his ride home.

## 2013-12-25 ENCOUNTER — Emergency Department (HOSPITAL_COMMUNITY): Payer: Medicare Other

## 2013-12-25 ENCOUNTER — Encounter (HOSPITAL_COMMUNITY): Payer: Self-pay | Admitting: Emergency Medicine

## 2013-12-25 ENCOUNTER — Emergency Department (HOSPITAL_COMMUNITY)
Admission: EM | Admit: 2013-12-25 | Discharge: 2013-12-25 | Disposition: A | Discharge status: Home / Self Care | Payer: Medicare Other | Attending: Emergency Medicine | Admitting: Emergency Medicine

## 2013-12-25 DIAGNOSIS — I10 Essential (primary) hypertension: Secondary | ICD-10-CM | POA: Insufficient documentation

## 2013-12-25 DIAGNOSIS — R109 Unspecified abdominal pain: Secondary | ICD-10-CM | POA: Insufficient documentation

## 2013-12-25 DIAGNOSIS — S335XXA Sprain of ligaments of lumbar spine, initial encounter: Secondary | ICD-10-CM | POA: Diagnosis not present

## 2013-12-25 DIAGNOSIS — Y93H2 Activity, gardening and landscaping: Secondary | ICD-10-CM | POA: Insufficient documentation

## 2013-12-25 DIAGNOSIS — J45901 Unspecified asthma with (acute) exacerbation: Secondary | ICD-10-CM

## 2013-12-25 DIAGNOSIS — R0602 Shortness of breath: Secondary | ICD-10-CM | POA: Diagnosis not present

## 2013-12-25 DIAGNOSIS — Y92009 Unspecified place in unspecified non-institutional (private) residence as the place of occurrence of the external cause: Secondary | ICD-10-CM | POA: Insufficient documentation

## 2013-12-25 DIAGNOSIS — R296 Repeated falls: Secondary | ICD-10-CM | POA: Insufficient documentation

## 2013-12-25 DIAGNOSIS — S336XXA Sprain of sacroiliac joint, initial encounter: Secondary | ICD-10-CM | POA: Diagnosis not present

## 2013-12-25 DIAGNOSIS — Z862 Personal history of diseases of the blood and blood-forming organs and certain disorders involving the immune mechanism: Secondary | ICD-10-CM | POA: Insufficient documentation

## 2013-12-25 DIAGNOSIS — S39012A Strain of muscle, fascia and tendon of lower back, initial encounter: Secondary | ICD-10-CM

## 2013-12-25 DIAGNOSIS — Z8639 Personal history of other endocrine, nutritional and metabolic disease: Secondary | ICD-10-CM | POA: Insufficient documentation

## 2013-12-25 LAB — BASIC METABOLIC PANEL
Anion gap: 13 (ref 5–15)
BUN: 12 mg/dL (ref 6–23)
CALCIUM: 9.3 mg/dL (ref 8.4–10.5)
CO2: 24 mEq/L (ref 19–32)
CREATININE: 0.87 mg/dL (ref 0.50–1.35)
Chloride: 102 mEq/L (ref 96–112)
GFR calc Af Amer: >90 mL/min (ref >90)
GLUCOSE: 112 mg/dL — AB (ref 70–99)
Potassium: 4.1 mEq/L (ref 3.7–5.3)
Sodium: 139 mEq/L (ref 137–147)

## 2013-12-25 LAB — CBC WITH DIFFERENTIAL/PLATELET
Basophils Absolute: 0 10*3/uL (ref 0.0–0.1)
Basophils Relative: 0 % (ref 0–1)
EOS ABS: 0.1 10*3/uL (ref 0.0–0.7)
EOS PCT: 1 % (ref 0–5)
HEMATOCRIT: 42 % (ref 39.0–52.0)
Hemoglobin: 13.4 g/dL (ref 13.0–17.0)
LYMPHS ABS: 2.2 10*3/uL (ref 0.7–4.0)
Lymphocytes Relative: 21 % (ref 12–46)
MCH: 24.2 pg — ABNORMAL LOW (ref 26.0–34.0)
MCHC: 31.9 g/dL (ref 30.0–36.0)
MCV: 75.9 fL — AB (ref 78.0–100.0)
MONO ABS: 0.9 10*3/uL (ref 0.1–1.0)
Monocytes Relative: 8 % (ref 3–12)
Neutro Abs: 7.3 10*3/uL (ref 1.7–7.7)
Neutrophils Relative %: 70 % (ref 43–77)
PLATELETS: 181 10*3/uL (ref 150–400)
RBC: 5.53 MIL/uL (ref 4.22–5.81)
RDW: 15.2 % (ref 11.5–15.5)
WBC: 10.4 10*3/uL (ref 4.0–10.5)

## 2013-12-25 LAB — I-STAT TROPONIN, ED: TROPONIN I, POC: 0 ng/mL (ref 0.00–0.08)

## 2013-12-25 MED ORDER — ACETAMINOPHEN 500 MG PO TABS: 500.0000 mg | ORAL_TABLET | Freq: Four times a day (QID) | ORAL | Status: DC | PRN

## 2013-12-25 MED ORDER — HYDROCODONE-ACETAMINOPHEN 5-325 MG PO TABS
1.0000 | ORAL_TABLET | Freq: Once | ORAL | Status: AC
  Administered 2013-12-25: 1 via ORAL
  Filled 2013-12-25: qty 1

## 2013-12-25 MED ORDER — ONDANSETRON 4 MG PO TBDP
4.0000 mg | ORAL_TABLET | Freq: Once | ORAL | Status: AC
  Administered 2013-12-25: 4 mg via ORAL
  Filled 2013-12-25: qty 1

## 2013-12-25 MED ORDER — PREDNISONE 20 MG PO TABS
60.0000 mg | ORAL_TABLET | Freq: Once | ORAL | Status: AC
  Administered 2013-12-25: 60 mg via ORAL
  Filled 2013-12-25: qty 3

## 2013-12-25 MED ORDER — ALBUTEROL SULFATE HFA 108 (90 BASE) MCG/ACT IN AERS
2.0000 | INHALATION_SPRAY | RESPIRATORY_TRACT | Status: DC | PRN
  Administered 2013-12-25: 2 via RESPIRATORY_TRACT
  Filled 2013-12-25: qty 6.7

## 2013-12-25 NOTE — ED Notes (Signed)
Sign language interpreter paged

## 2013-12-25 NOTE — ED Provider Notes (Signed)
Medical screening examination/treatment/procedure(s) were performed by non-physician practitioner and as supervising physician I was immediately available for consultation/collaboration.    Dot Lanes, MD 12/25/13 1116

## 2013-12-25 NOTE — Discharge Instructions (Signed)
Asthma Asthma is a recurring condition in which the airways tighten and narrow. Asthma can make it difficult to breathe. It can cause coughing, wheezing, and shortness of breath. Asthma episodes, also called asthma attacks, range from minor to life-threatening. Asthma cannot be cured, but medicines and lifestyle changes can help control it. CAUSES Asthma is believed to be caused by inherited (genetic) and environmental factors, but its exact cause is unknown. Asthma may be triggered by allergens, lung infections, or irritants in the air. Asthma triggers are different for each person. Common triggers include:   Animal dander.  Dust mites.  Cockroaches.  Pollen from trees or grass.  Mold.  Smoke.  Air pollutants such as dust, household cleaners, hair sprays, aerosol sprays, paint fumes, strong chemicals, or strong odors.  Cold air, weather changes, and winds (which increase molds and pollens in the air).  Strong emotional expressions such as crying or laughing hard.  Stress.  Certain medicines (such as aspirin) or types of drugs (such as beta-blockers).  Sulfites in foods and drinks. Foods and drinks that may contain sulfites include dried fruit, potato chips, and sparkling grape juice.  Infections or inflammatory conditions such as the flu, a cold, or an inflammation of the nasal membranes (rhinitis).  Gastroesophageal reflux disease (GERD).  Exercise or strenuous activity. SYMPTOMS Symptoms may occur immediately after asthma is triggered or many hours later. Symptoms include:  Wheezing.  Excessive nighttime or early morning coughing.  Frequent or severe coughing with a common cold.  Chest tightness.  Shortness of breath. DIAGNOSIS  The diagnosis of asthma is made by a review of your medical history and a physical exam. Tests may also be performed. These may include:  Lung function studies. These tests show how much air you breathe in and out.  Allergy  tests.  Imaging tests such as X-rays. TREATMENT  Asthma cannot be cured, but it can usually be controlled. Treatment involves identifying and avoiding your asthma triggers. It also involves medicines. There are 2 classes of medicine used for asthma treatment:   Controller medicines. These prevent asthma symptoms from occurring. They are usually taken every day.  Reliever or rescue medicines. These quickly relieve asthma symptoms. They are used as needed and provide short-term relief. Your health care provider will help you create an asthma action plan. An asthma action plan is a written plan for managing and treating your asthma attacks. It includes a list of your asthma triggers and how they may be avoided. It also includes information on when medicines should be taken and when their dosage should be changed. An action plan may also involve the use of a device called a peak flow meter. A peak flow meter measures how well the lungs are working. It helps you monitor your condition. HOME CARE INSTRUCTIONS   Take medicine as directed by your health care provider. Speak with your health care provider if you have questions about how or when to take the medicines.  Use a peak flow meter as directed by your health care provider. Record and keep track of readings.  Understand and use the action plan to help minimize or stop an asthma attack without needing to seek medical care.  Control your home environment in the following ways to help prevent asthma attacks:  Do not smoke. Avoid being exposed to secondhand smoke.  Change your heating and air conditioning filter regularly.  Limit your use of fireplaces and wood stoves.  Get rid of pests (such as roaches and mice)  and their droppings.  Throw away plants if you see mold on them.  Clean your floors and dust regularly. Use unscented cleaning products.  Try to have someone else vacuum for you regularly. Stay out of rooms while they are being  vacuumed and for a short while afterward. If you vacuum, use a dust mask from a hardware store, a double-layered or microfilter vacuum cleaner bag, or a vacuum cleaner with a HEPA filter.  Replace carpet with wood, tile, or vinyl flooring. Carpet can trap dander and dust.  Use allergy-proof pillows, mattress covers, and box spring covers.  Wash bed sheets and blankets every week in hot water and dry them in a dryer.  Use blankets that are made of polyester or cotton.  Clean bathrooms and kitchens with bleach. If possible, have someone repaint the walls in these rooms with mold-resistant paint. Keep out of the rooms that are being cleaned and painted.  Wash hands frequently. SEEK MEDICAL CARE IF:   You have wheezing, shortness of breath, or a cough even if taking medicine to prevent attacks.  The colored mucus you cough up (sputum) is thicker than usual.  Your sputum changes from clear or white to yellow, green, gray, or bloody.  You have any problems that may be related to the medicines you are taking (such as a rash, itching, swelling, or trouble breathing).  You are using a reliever medicine more than 2-3 times per week.  Your peak flow is still at 50-79% of your personal best after following your action plan for 1 hour. SEEK IMMEDIATE MEDICAL CARE IF:   You seem to be getting worse and are unresponsive to treatment during an asthma attack.  You are short of breath even at rest.  You get short of breath when doing very little physical activity.  You have difficulty eating, drinking, or talking due to asthma symptoms.  You develop chest pain.  You develop a fast heartbeat.  You have a bluish color to your lips or fingernails.  You are lightheaded, dizzy, or faint.  Your peak flow is less than 50% of your personal best.  You have a fever or persistent symptoms for more than 2-3 days.  You have a fever and symptoms suddenly get worse. MAKE SURE YOU:   Understand  these instructions.  Will watch your condition.  Will get help right away if you are not doing well or get wor Back Pain, Adult Low back pain is very common. About 1 in 5 people have back pain.The cause of low back pain is rarely dangerous. The pain often gets better over time.About half of people with a sudden onset of back pain feel better in just 2 weeks. About 8 in 10 people feel better by 6 weeks.  CAUSES Some common causes of back pain include: Strain of the muscles or ligaments supporting the spine. Wear and tear (degeneration) of the spinal discs. Arthritis. Direct injury to the back. DIAGNOSIS Most of the time, the direct cause of low back pain is not known.However, back pain can be treated effectively even when the exact cause of the pain is unknown.Answering your caregiver's questions about your overall health and symptoms is one of the most accurate ways to make sure the cause of your pain is not dangerous. If your caregiver needs more information, he or she may order lab work or imaging tests (X-rays or MRIs).However, even if imaging tests show changes in your back, this usually does not require surgery. HOME CARE  INSTRUCTIONS For many people, back pain returns.Since low back pain is rarely dangerous, it is often a condition that people can learn to [facility] their own.  Remain active. It is stressful on the back to sit or stand in one place. Do not sit, drive, or stand in one place for more than 30 minutes at a time. Take short walks on level surfaces as soon as pain allows.Try to increase the length of time you walk each day. Do not stay in bed.Resting more than 1 or 2 days can delay your recovery. Do not avoid exercise or work.Your body is made to move.It is not dangerous to be active, even though your back may hurt.Your back will likely heal faster if you return to being active before your pain is gone. Pay attention to your body when you bend and lift. Many people  have less discomfortwhen lifting if they bend their knees, keep the load close to their bodies,and avoid twisting. Often, the most comfortable positions are those that put less stress on your recovering back. Find a comfortable position to sleep. Use a firm mattress and lie on your side with your knees slightly bent. If you lie on your back, put a pillow under your knees. Only take over-the-counter or prescription medicines as directed by your caregiver. Over-the-counter medicines to reduce pain and inflammation are often the most helpful.Your caregiver may prescribe muscle relaxant drugs.These medicines help dull your pain so you can more quickly return to your normal activities and healthy exercise. Put ice on the injured area. Put ice in a plastic bag. Place a towel between your skin and the bag. Leave the ice on for 15-20 minutes, 03-04 times a day for the first 2 to 3 days. After that, ice and heat may be alternated to reduce pain and spasms. Ask your caregiver about trying back exercises and gentle massage. This may be of some benefit. Avoid feeling anxious or stressed.Stress increases muscle tension and can worsen back pain.It is important to recognize when you are anxious or stressed and learn ways to manage it.Exercise is a great option. SEEK MEDICAL CARE IF: You have pain that is not relieved with rest or medicine. You have pain that does not improve in 1 week. You have new symptoms. You are generally not feeling well. SEEK IMMEDIATE MEDICAL CARE IF:  You have pain that radiates from your back into your legs. You develop new bowel or bladder control problems. You have unusual weakness or numbness in your arms or legs. You develop nausea or vomiting. You develop abdominal pain. You feel faint. Document Released: 06/11/2005 Document Revised: 12/11/2011 Document Reviewed: 10/30/2010 [facility] Patient Information 2015 [place], [place]. This information is not intended to replace  advice given to you by your health care provider. Make sure you discuss any questions you have with your health care provider.  se. Document Released: 06/11/2005 Document Revised: 06/16/2013 Document Reviewed: 01/08/2013 [facility] Patient Information 2015 [place], [place]. This information is not intended to replace advice given to you by your health care provider. Make sure you discuss any questions you have with your health care provider.

## 2013-12-25 NOTE — ED Notes (Signed)
2L Dependant placed on 2 L oxygen

## 2013-12-25 NOTE — ED Notes (Signed)
Pt. Ststed, i started being SOB and having rt. Side pain last week.

## 2013-12-25 NOTE — ED Notes (Signed)
PA at bedside.

## 2013-12-25 NOTE — ED Provider Notes (Signed)
CSN: [id number]     Arrival date & time 12/25/13  0844 History   First MD Initiated Contact with Patient 12/25/13 [phone]     Chief Complaint  Patient presents with  . Shortness of Breath  . Abdominal Pain    c/o rt. side pain     (Consider location/radiation/quality/duration/timing/severity/associated sxs/prior Treatment) HPI  Patient to the ER for evaluation of multiple complaints. He is an oxygen dependant patient who reports that on Saturday he went out to trim the hedges in his front yard and elected to not use his oxygen because "I didn't think I would need it". He reports that he became short of breath during this. He is now having left wrist pain and right low back pain since then. He presents to the ED and is not wearing his oxygen. He reports " I left it at home". He is tachycardic on arrival with pulse ox at 95% on room air. When placed on oxygen his pulse and O2 improved in triage. He is here to be evaluated for the pains and admits, it has been a long time since I did any physical activity like that. Pt denies lower extremity swelling, abdominal pains, chest pains, vomiting, weakness, fevers, confusion or any other associated symptoms.  The patient lives with his mom, after reviewing previous notes he was placed in a SNF at one point. He reports that he likes being at home and someone brought him oxygen tanks to use at night and sometimes during the day but he doesn't know why he needs it or uses it except for that he gets SOB.  Past Medical History  Diagnosis Date  . Hypertension   . Asthma   . Hyperlipemia   . Gout    History reviewed. No pertinent past surgical history. No family history on file. History  Substance Use Topics  . Smoking status: Never Smoker   . Smokeless tobacco: Not on file  . Alcohol Use: No    Review of Systems  All other systems reviewed and are negative.       Allergies  Ibuprofen  Home Medications   Prior to Admission medications     Medication Sig Start Date End Date Taking? Authorizing Provider  acetaminophen (TYLENOL) 500 MG tablet Take 1 tablet (500 mg total) by mouth every 6 (six) hours as needed. 12/25/13   Journiee Feldkamp Marilu Favre, PA-C   BP 140/120  Pulse 53  Temp(Src) 98.6 F (37 C) (Oral)  Resp 12  Ht 6' (1.829 m)  Wt 291 lb (131.997 kg)  BMI 39.46 kg/m2  SpO2 94% Physical Exam  Nursing note and vitals reviewed. Constitutional: He appears well-developed and well-nourished. No distress.  HENT:  Head: Normocephalic and atraumatic.  Eyes: Pupils are equal, round, and reactive to light.  Neck: Normal range of motion. Neck supple.  Cardiovascular: Regular rhythm.  Tachycardia present.   Pulmonary/Chest: Effort normal. No respiratory distress. He has no decreased breath sounds. He has wheezes (mild expiratory). He has no rales.  Abdominal: Soft.  Musculoskeletal:       Back:  Pt has equal strength to bilateral lower extremities.  Neurosensory function adequate to both legs No clonus on dorsiflextion Skin color is normal. Skin is warm and moist.  I see no step off deformity, no midline bony tenderness.  Pt is able to ambulate.  No crepitus, laceration, effusion, induration, lesions, swelling.   Pedal pulses are symmetrical and palpable bilaterally  moderate tenderness to palpation of thoracic paraspinel muscles No  lower extremity swelling  Neurological: He is alert.  Skin: Skin is warm and dry.    ED Course  Procedures (including critical care time) Labs Review Labs Reviewed  CBC WITH DIFFERENTIAL - Abnormal; Notable for the following:    MCV 75.9 (*)    MCH 24.2 (*)    All other components within normal limits  BASIC METABOLIC PANEL - Abnormal; Notable for the following:    Glucose, Bld 112 (*)    All other components within normal limits  I-STAT TROPOININ, ED    Imaging Review Dg Chest 2 View  12/25/2013   CLINICAL DATA:  Shortness of breath  EXAM: CHEST  2 VIEW  COMPARISON:  07/09/2013   FINDINGS: Cardiac shadow is stable. The lungs are well aerated bilaterally. No focal infiltrate or sizable effusion is seen. No acute bony abnormality is noted.  IMPRESSION: No acute abnormality seen.   Electronically Signed   By: Inez Catalina M.D.   On: 12/25/2013 09:40     EKG Interpretation None      MDM   Final diagnoses:  Low back strain, initial encounter  Asthma exacerbation    The patient has not been using his home oxygen like he is supposed to. He says that he decided not to use it and left it at home. He originally came to have his left wrist and right low back looked at but on the way became short of breath. We placed him on 2 L [place] here in the ED and that improved. He does have some audible wheezing with a history of asthma. Albuterol HFA given in the ED and a dose of 60mg  Prednisone. He was given a dose of Norco in the ED for his musculoskeletal pain. He is feeling much better. Discussed with Dr. Audie Pinto whether he felt a d-dimer was needed to evaluate PE and he did not feel like it was necessary. Will discharge the patient with Tylenol for pain. Given an HFA Albuterol inhaler for home. He has been instructed to use his at home oxygen as he has been instructed to before. He says he will now.  53 y.o.Roberto Horne evaluation in the Emergency Department is complete. It has been determined that no acute conditions requiring further emergency intervention are present at this time. The patient/guardian have been advised of the diagnosis and plan. We have discussed signs and symptoms that warrant return to the ED, such as changes or worsening in symptoms.  Vital signs are stable at discharge. Filed Vitals:   12/25/13 1100  BP: 140/120  Pulse: 53  Temp:   Resp: 12    Patient/guardian has voiced understanding and agreed to follow-up with the PCP or specialist.     Linus Mako, PA-C 12/25/13 1113

## 2014-02-05 ENCOUNTER — Emergency Department (HOSPITAL_COMMUNITY)
Admission: EM | Admit: 2014-02-05 | Discharge: 2014-02-05 | Disposition: A | Discharge status: Home / Self Care | Payer: Medicare Other | Attending: Emergency Medicine | Admitting: Emergency Medicine

## 2014-02-05 ENCOUNTER — Encounter (HOSPITAL_COMMUNITY): Payer: Self-pay | Admitting: Emergency Medicine

## 2014-02-05 DIAGNOSIS — J45909 Unspecified asthma, uncomplicated: Secondary | ICD-10-CM | POA: Diagnosis not present

## 2014-02-05 DIAGNOSIS — J3489 Other specified disorders of nose and nasal sinuses: Secondary | ICD-10-CM | POA: Diagnosis not present

## 2014-02-05 DIAGNOSIS — IMO0002 Reserved for concepts with insufficient information to code with codable children: Secondary | ICD-10-CM | POA: Diagnosis not present

## 2014-02-05 DIAGNOSIS — Z8639 Personal history of other endocrine, nutritional and metabolic disease: Secondary | ICD-10-CM | POA: Insufficient documentation

## 2014-02-05 DIAGNOSIS — Z862 Personal history of diseases of the blood and blood-forming organs and certain disorders involving the immune mechanism: Secondary | ICD-10-CM | POA: Insufficient documentation

## 2014-02-05 DIAGNOSIS — I10 Essential (primary) hypertension: Secondary | ICD-10-CM | POA: Diagnosis not present

## 2014-02-05 DIAGNOSIS — R0981 Nasal congestion: Secondary | ICD-10-CM

## 2014-02-05 MED ORDER — FLUTICASONE PROPIONATE 50 MCG/ACT NA SUSP: 1.0000 | Freq: Every day | NASAL | Status: DC

## 2014-02-05 NOTE — ED Provider Notes (Signed)
CSN: [id number]     Arrival date & time 02/05/14  1023 History  This chart was scribed for Vilinda Blanks. Mathews Argyle, working with Hoy Morn, MD by Steva Colder, ED Scribe. The patient was seen in room TR06C/TR06C at 10:40 AM.    Chief Complaint  Patient presents with  . Nasal Congestion    The history is provided by the patient. No language interpreter was used.  HPI Comments: Roberto Horne is a 53 y.o. male who presents to the Emergency Department complaining of nasal congestion onset 1 week. He states that he does have allergies. He states that he does not take anything for his allergies. He denies cough, sore throat, chest pain, SOB, fever, and any other associated symptoms. He denies any sick contacts.   Past Medical History  Diagnosis Date  . Hypertension   . Asthma   . Hyperlipemia   . Gout    History reviewed. No pertinent past surgical history. No family history on file. History  Substance Use Topics  . Smoking status: Never Smoker   . Smokeless tobacco: Not on file  . Alcohol Use: No    Review of Systems  Constitutional: Negative for fever.  HENT: Positive for congestion. Negative for sore throat.   Respiratory: Negative for cough and shortness of breath.   All other systems reviewed and are negative.     Allergies  Ibuprofen  Home Medications   Prior to Admission medications   Medication Sig Start Date End Date Taking? Authorizing Provider  acetaminophen (TYLENOL) 500 MG tablet Take 1 tablet (500 mg total) by mouth every 6 (six) hours as needed. 12/25/13   Tiffany Marilu Favre, PA-C  fluticasone (FLONASE) 50 MCG/ACT nasal spray Place 1 spray into both nostrils daily. 02/05/14   Larene Pickett, PA-C   BP 115/86  Pulse 80  Temp(Src) 97.4 F (36.3 C) (Oral)  Resp 14  SpO2 98%  Physical Exam  Nursing note and vitals reviewed. Constitutional: He is oriented to person, place, and time. He appears well-developed and well-nourished. No distress.  HENT:  Head:  Normocephalic and atraumatic.  Right Ear: Tympanic membrane and ear canal normal.  Left Ear: Tympanic membrane and ear canal normal.  Nose: Mucosal edema and rhinorrhea present.  Mouth/Throat: Uvula is midline, oropharynx is clear and moist and mucous membranes are normal. No oropharyngeal exudate, posterior oropharyngeal edema, posterior oropharyngeal erythema or tonsillar abscesses.  Congestion noted.  Eyes: Conjunctivae and EOM are normal. Pupils are equal, round, and reactive to light.  Neck: Normal range of motion. Neck supple.  Cardiovascular: Normal rate, regular rhythm and normal heart sounds.   Pulmonary/Chest: Effort normal and breath sounds normal. No respiratory distress. He has no wheezes.  Musculoskeletal: Normal range of motion.  Neurological: He is alert and oriented to person, place, and time.  Skin: Skin is warm and dry. He is not diaphoretic.  Psychiatric: He has a normal mood and affect.    ED Course  Procedures (including critical care time) DIAGNOSTIC STUDIES: Oxygen Saturation is 98% on room air, normal by my interpretation.    COORDINATION OF CARE: 10:44 AM-Discussed treatment plan which includes flonase with pt at bedside and pt agreed to plan.   Labs Review Labs Reviewed - No data to display  Imaging Review No results found.   EKG Interpretation None      MDM   Final diagnoses:  Nasal congestion   Nasal congestion without associated sx.  Pt afebrile and non-toxic appearing, VS stable.  Will start on flonase, encouraged to start daily allergy medication.  FU with PCP.  Discussed plan with patient, he/she acknowledged understanding and agreed with plan of care.  Return precautions given for new or worsening symptoms.  I personally performed the services described in this documentation, which was scribed in my presence. The recorded information has been reviewed and is accurate.  Larene Pickett, PA-C 02/05/14 1048

## 2014-02-05 NOTE — ED Provider Notes (Signed)
Medical screening examination/treatment/procedure(s) were performed by non-physician practitioner and as supervising physician I was immediately available for consultation/collaboration.   EKG Interpretation None        Hoy Morn, MD 02/05/14 2213

## 2014-02-05 NOTE — Discharge Instructions (Signed)
Take the prescribed medication as directed.  May wish to start a daily allergy medication such as zyrtec, allegra, claritin. Follow-up with primary care physician. Return to the ED for new or worsening symptoms.

## 2014-02-05 NOTE — ED Notes (Signed)
Pt only c/o nasal congestion x 1 week. Denies cough, sore throat or SOB.

## 2014-05-18 ENCOUNTER — Encounter (HOSPITAL_COMMUNITY): Payer: Self-pay | Admitting: Emergency Medicine

## 2014-05-18 ENCOUNTER — Emergency Department (HOSPITAL_COMMUNITY)
Admission: EM | Admit: 2014-05-18 | Discharge: 2014-05-18 | Disposition: A | Discharge status: Home / Self Care | Payer: Medicare Other | Attending: Emergency Medicine | Admitting: Emergency Medicine

## 2014-05-18 DIAGNOSIS — L299 Pruritus, unspecified: Secondary | ICD-10-CM | POA: Diagnosis not present

## 2014-05-18 DIAGNOSIS — T07XXXA Unspecified multiple injuries, initial encounter: Secondary | ICD-10-CM

## 2014-05-18 DIAGNOSIS — Z8639 Personal history of other endocrine, nutritional and metabolic disease: Secondary | ICD-10-CM | POA: Insufficient documentation

## 2014-05-18 DIAGNOSIS — J45909 Unspecified asthma, uncomplicated: Secondary | ICD-10-CM | POA: Diagnosis not present

## 2014-05-18 DIAGNOSIS — L853 Xerosis cutis: Secondary | ICD-10-CM | POA: Diagnosis not present

## 2014-05-18 DIAGNOSIS — L981 Factitial dermatitis: Secondary | ICD-10-CM | POA: Diagnosis not present

## 2014-05-18 DIAGNOSIS — I10 Essential (primary) hypertension: Secondary | ICD-10-CM | POA: Insufficient documentation

## 2014-05-18 DIAGNOSIS — Z7951 Long term (current) use of inhaled steroids: Secondary | ICD-10-CM | POA: Insufficient documentation

## 2014-05-18 DIAGNOSIS — R21 Rash and other nonspecific skin eruption: Secondary | ICD-10-CM | POA: Diagnosis present

## 2014-05-18 DIAGNOSIS — Z8739 Personal history of other diseases of the musculoskeletal system and connective tissue: Secondary | ICD-10-CM | POA: Diagnosis not present

## 2014-05-18 MED ORDER — HYDROCORTISONE 2.5 % EX LOTN: TOPICAL_LOTION | Freq: Two times a day (BID) | CUTANEOUS | Status: DC

## 2014-05-18 MED ORDER — CETIRIZINE HCL 10 MG PO TABS: 10.0000 mg | ORAL_TABLET | Freq: Every day | ORAL | Status: DC

## 2014-05-18 NOTE — ED Notes (Signed)
Pt. Stated, I've been itching all over for a week.

## 2014-05-18 NOTE — ED Provider Notes (Signed)
CSN: [id number]     Arrival date & time 05/18/14  1651 History  This chart was scribed for non-physician practitioner, Abigail Butts, PA-C, working with Roberto Bucy, MD, by Delphia Grates, ED Scribe. This patient was seen in room TR09C/TR09C and the patient's care was started at 5:30 PM.     Chief Complaint  Patient presents with  . Rash  . Pruritis    The history is provided by the patient and medical records. No language interpreter was used.     HPI Comments: Roberto Horne is a 53 y.o. male who presents to the Emergency Department complaining of constant itching to his bilateral legs and abdomen that began 6 days ago. Patient states he was outside, cutting hedges prior to onset of symptoms, but was wearing long sleeves and long pants. Patient has very dry skin and reports he rarely uses lotions after showering. He denies exposure to new lotions, soaps, medications, detergents, clothes, environments or makeup. Patient has history of asthma, HLD, and gout.  Past Medical History  Diagnosis Date  . Hypertension   . Asthma   . Hyperlipemia   . Gout    History reviewed. No pertinent past surgical history. No family history on file. History  Substance Use Topics  . Smoking status: Never Smoker   . Smokeless tobacco: Not on file  . Alcohol Use: No    Review of Systems  Constitutional: Negative for fever, chills, diaphoresis, appetite change, fatigue and unexpected weight change.  HENT: Negative for mouth sores.   Eyes: Negative for visual disturbance.  Respiratory: Negative for cough, chest tightness, shortness of breath and wheezing.   Cardiovascular: Negative for chest pain.  Gastrointestinal: Negative for nausea, vomiting, abdominal pain, diarrhea and constipation.  Endocrine: Negative for polydipsia, polyphagia and polyuria.  Genitourinary: Negative for dysuria, urgency, frequency and hematuria.  Musculoskeletal: Negative for back pain and neck stiffness.  Skin:  Positive for rash.       Itching  Allergic/Immunologic: Negative for immunocompromised state.  Neurological: Negative for syncope, light-headedness and headaches.  Hematological: Does not bruise/bleed easily.  Psychiatric/Behavioral: Negative for sleep disturbance. The patient is not nervous/anxious.       Allergies  Ibuprofen  Home Medications   Prior to Admission medications   Medication Sig Start Date End Date Taking? Authorizing Provider  acetaminophen (TYLENOL) 500 MG tablet Take 1 tablet (500 mg total) by mouth every 6 (six) hours as needed. 12/25/13  Yes Tiffany Marilu Favre, PA-C  cetirizine (ZYRTEC ALLERGY) 10 MG tablet Take 1 tablet (10 mg total) by mouth daily. 05/18/14   Jawanda Passey, PA-C  fluticasone (FLONASE) 50 MCG/ACT nasal spray Place 1 spray into both nostrils daily. Patient not taking: Reported on 05/18/2014 02/05/14   Larene Pickett, PA-C  hydrocortisone 2.5 % lotion Apply topically 2 (two) times daily. 05/18/14   Stayce Delancy, PA-C   Triage Vitals: BP 160/97 mmHg  Pulse 95  Temp(Src) 98.3 F (36.8 C)  Resp 18  Ht 6\' 4"  (1.93 m)  Wt 320 lb (145.151 kg)  BMI 38.97 kg/m2  SpO2 97%  Physical Exam  Constitutional: He is oriented to person, place, and time. He appears well-developed and well-nourished. No distress.  Awake, alert, nontoxic appearance  HENT:  Head: Normocephalic and atraumatic.  Right Ear: Tympanic membrane, external ear and ear canal normal.  Left Ear: Tympanic membrane, external ear and ear canal normal.  Nose: Nose normal. No mucosal edema or rhinorrhea.  Mouth/Throat: Uvula is midline and oropharynx is clear and  moist. No uvula swelling. No oropharyngeal exudate, posterior oropharyngeal edema, posterior oropharyngeal erythema or tonsillar abscesses.  No swelling of the uvula or oropharynx   Eyes: Conjunctivae are normal. No scleral icterus.  Neck: Normal range of motion. Neck supple.  Patent airway No stridor; normal  phonation Handling secretions without difficulty  Cardiovascular: Normal rate, regular rhythm, normal heart sounds and intact distal pulses.   No murmur heard. Pulmonary/Chest: Effort normal and breath sounds normal. No stridor. No respiratory distress. He has no wheezes.  Equal chest expansion  Abdominal: Soft. Bowel sounds are normal. He exhibits no mass. There is no tenderness. There is no rebound and no guarding.  Musculoskeletal: Normal range of motion. He exhibits no edema.  Neurological: He is alert and oriented to person, place, and time.  Speech is clear and goal oriented Moves extremities without ataxia  Skin: Skin is warm and dry. No rash noted. He is not diaphoretic.  Scaling dry skin located over the entirety of the body, worst on the legs and left side of the abdomen with excoriations throughout No erythema, increased warmth, induration or evidence of secondary infection Urticaria, hives, petechiae or purpura.  Psychiatric: He has a normal mood and affect.  Nursing note and vitals reviewed.   ED Course  Procedures (including critical care time)  DIAGNOSTIC STUDIES: Oxygen Saturation is 97% on room air, adequate by my interpretation.    COORDINATION OF CARE: At 3785 Discussed treatment plan with patient which includes medication lotion or Zyrtec. Patient agrees.   Labs Review Labs Reviewed - No data to display  Imaging Review No results found.   EKG Interpretation None      MDM   Final diagnoses:  Xerosis of skin  Multiple excoriations   Roberto Horne presents with itching and complaints of rash over his legs and left abdomen. On exam patient without urticaria or rash that significant cirrhosis over the entire body. No evidence of contact dermatitis, no concern for poison ivy or poison sumac. No vesicles to suggest shingles. No evidence of secondary infection. Patient encouraged to use lotion after every bath or shower. Recommended use of Zyrtec or Benadryl  for itching. Patient will be prescribed hydrocortisone lotion was instructed not to use this on his face or genitals.  No evidence of systemic allergic reaction.  I have personally reviewed patient's vitals, nursing note and any pertinent labs or imaging.  I performed an focused physical exam; undressed when appropriate .    It has been determined that no acute conditions requiring further emergency intervention are present at this time. The patient/guardian have been advised of the diagnosis and plan. I reviewed any labs and imaging including any potential incidental findings. We have discussed signs and symptoms that warrant return to the ED and they are listed in the discharge instructions.    Vital signs are stable at discharge.   BP 160/97 mmHg  Pulse 95  Temp(Src) 98.3 F (36.8 C)  Resp 18  Ht 6\' 4"  (1.93 m)  Wt 320 lb (145.151 kg)  BMI 38.97 kg/m2  SpO2 97%  I personally performed the services described in this documentation, which was scribed in my presence. The recorded information has been reviewed and is accurate.   Jarrett Soho Mindy Behnken, PA-C 05/18/14 [place], MD 05/18/14 [phone]

## 2014-05-18 NOTE — Discharge Instructions (Signed)
1. Medications: hydrocortisone lotion, zyrtec, usual home medications 2. Treatment: rest, drink plenty of fluids, use OTC lotion every morning or night after bath/shower 3. Follow Up: Please followup with your primary doctor in 3 days for discussion of your diagnoses and further evaluation after today's visit; if you do not have a primary care doctor use the resource guide provided to find one; Please return to the ER for redness, swelling, purulent drainage

## 2014-06-30 ENCOUNTER — Emergency Department (HOSPITAL_COMMUNITY): Payer: Medicare Other

## 2014-06-30 ENCOUNTER — Encounter (HOSPITAL_COMMUNITY): Payer: Self-pay | Admitting: *Deleted

## 2014-06-30 ENCOUNTER — Emergency Department (HOSPITAL_COMMUNITY)
Admission: EM | Admit: 2014-06-30 | Discharge: 2014-06-30 | Disposition: A | Discharge status: Home / Self Care | Payer: Medicare Other | Attending: Emergency Medicine | Admitting: Emergency Medicine

## 2014-06-30 DIAGNOSIS — R109 Unspecified abdominal pain: Secondary | ICD-10-CM | POA: Diagnosis not present

## 2014-06-30 DIAGNOSIS — J45909 Unspecified asthma, uncomplicated: Secondary | ICD-10-CM | POA: Diagnosis not present

## 2014-06-30 DIAGNOSIS — Z8739 Personal history of other diseases of the musculoskeletal system and connective tissue: Secondary | ICD-10-CM | POA: Diagnosis not present

## 2014-06-30 DIAGNOSIS — Z8639 Personal history of other endocrine, nutritional and metabolic disease: Secondary | ICD-10-CM | POA: Diagnosis not present

## 2014-06-30 DIAGNOSIS — J069 Acute upper respiratory infection, unspecified: Secondary | ICD-10-CM | POA: Insufficient documentation

## 2014-06-30 DIAGNOSIS — R05 Cough: Secondary | ICD-10-CM | POA: Diagnosis not present

## 2014-06-30 DIAGNOSIS — I10 Essential (primary) hypertension: Secondary | ICD-10-CM | POA: Diagnosis not present

## 2014-06-30 DIAGNOSIS — R079 Chest pain, unspecified: Secondary | ICD-10-CM | POA: Insufficient documentation

## 2014-06-30 DIAGNOSIS — R0981 Nasal congestion: Secondary | ICD-10-CM | POA: Diagnosis present

## 2014-06-30 DIAGNOSIS — R0602 Shortness of breath: Secondary | ICD-10-CM | POA: Diagnosis not present

## 2014-06-30 LAB — CBC WITH DIFFERENTIAL/PLATELET
BASOS ABS: 0 10*3/uL (ref 0.0–0.1)
Basophils Relative: 0 % (ref 0–1)
Eosinophils Absolute: 0.1 10*3/uL (ref 0.0–0.7)
Eosinophils Relative: 1 % (ref 0–5)
HCT: 37.5 % — ABNORMAL LOW (ref 39.0–52.0)
Hemoglobin: 11.9 g/dL — ABNORMAL LOW (ref 13.0–17.0)
Lymphocytes Relative: 22 % (ref 12–46)
Lymphs Abs: 2.3 10*3/uL (ref 0.7–4.0)
MCH: 23.8 pg — ABNORMAL LOW (ref 26.0–34.0)
MCHC: 31.7 g/dL (ref 30.0–36.0)
MCV: 75 fL — AB (ref 78.0–100.0)
MONO ABS: 1.2 10*3/uL — AB (ref 0.1–1.0)
Monocytes Relative: 12 % (ref 3–12)
NEUTROS ABS: 7 10*3/uL (ref 1.7–7.7)
Neutrophils Relative %: 65 % (ref 43–77)
Platelets: 170 10*3/uL (ref 150–400)
RBC: 5 MIL/uL (ref 4.22–5.81)
RDW: 15 % (ref 11.5–15.5)
WBC: 10.6 10*3/uL — AB (ref 4.0–10.5)

## 2014-06-30 LAB — COMPREHENSIVE METABOLIC PANEL
ALT: 8 U/L (ref 0–53)
ANION GAP: 11 (ref 5–15)
AST: 16 U/L (ref 0–37)
Albumin: 3.2 g/dL — ABNORMAL LOW (ref 3.5–5.2)
Alkaline Phosphatase: 47 U/L (ref 39–117)
BUN: 8 mg/dL (ref 6–23)
CHLORIDE: 104 meq/L (ref 96–112)
CO2: 23 mmol/L (ref 19–32)
CREATININE: 0.92 mg/dL (ref 0.50–1.35)
Calcium: 9 mg/dL (ref 8.4–10.5)
GFR calc Af Amer: >90 mL/min (ref >90)
GFR calc non Af Amer: >90 mL/min (ref >90)
Glucose, Bld: 99 mg/dL (ref 70–99)
Potassium: 4.1 mmol/L (ref 3.5–5.1)
Sodium: 138 mmol/L (ref 135–145)
TOTAL PROTEIN: 7.5 g/dL (ref 6.0–8.3)
Total Bilirubin: 1.1 mg/dL (ref 0.3–1.2)

## 2014-06-30 LAB — LIPASE, BLOOD: Lipase: 23 U/L (ref 11–59)

## 2014-06-30 LAB — I-STAT TROPONIN, ED: TROPONIN I, POC: 0.01 ng/mL (ref 0.00–0.08)

## 2014-06-30 LAB — I-STAT CG4 LACTIC ACID, ED: Lactic Acid, Venous: 0.69 mmol/L (ref 0.5–2.2)

## 2014-06-30 MED ORDER — GUAIFENESIN 100 MG/5ML PO SOLN
5.0000 mL | Freq: Once | ORAL | Status: AC
  Administered 2014-06-30: 100 mg via ORAL
  Filled 2014-06-30 (×2): qty 5

## 2014-06-30 MED ORDER — IPRATROPIUM-ALBUTEROL 0.5-2.5 (3) MG/3ML IN SOLN
3.0000 mL | Freq: Once | RESPIRATORY_TRACT | Status: AC
  Administered 2014-06-30: 3 mL via RESPIRATORY_TRACT
  Filled 2014-06-30: qty 3

## 2014-06-30 NOTE — ED Provider Notes (Signed)
CSN: [id number]     Arrival date & time 06/30/14  0865 History   First MD Initiated Contact with Patient 06/30/14 [phone]     Chief Complaint  Patient presents with  . Abdominal Pain  . Nasal Congestion     (Consider location/radiation/quality/duration/timing/severity/associated sxs/prior Treatment) HPI Comments: Patient is a 54 year old male past medical history significant for hypertension, asthma, hyperlipidemia presenting to the emergency department for 2 day history of cough, nasal congestion, rhinorrhea. He states he has post tussive right-sided pain. He has not tried any medications. No modifying factors identified. He denies any fevers, chills, nausea, vomiting, diarrhea.  Patient is a 54 y.o. male presenting with abdominal pain.  Abdominal Pain Associated symptoms: chest pain (posttussive) and cough   Associated symptoms: no chills, no fever, no nausea and no vomiting     Past Medical History  Diagnosis Date  . Hypertension   . Asthma   . Hyperlipemia   . Gout    History reviewed. No pertinent past surgical history. History reviewed. No pertinent family history. History  Substance Use Topics  . Smoking status: Never Smoker   . Smokeless tobacco: Not on file  . Alcohol Use: No    Review of Systems  Constitutional: Negative for fever and chills.  HENT: Positive for congestion and rhinorrhea.   Respiratory: Positive for cough.   Cardiovascular: Positive for chest pain (posttussive).  Gastrointestinal: Positive for abdominal pain (posttussive). Negative for nausea and vomiting.  All other systems reviewed and are negative.     Allergies  Ibuprofen  Home Medications   Prior to Admission medications   Medication Sig Start Date End Date Taking? Authorizing Provider  acetaminophen (TYLENOL) 500 MG tablet Take 1 tablet (500 mg total) by mouth every 6 (six) hours as needed. Patient not taking: Reported on 06/30/2014 12/25/13   Linus Mako, PA-C  cetirizine (ZYRTEC  ALLERGY) 10 MG tablet Take 1 tablet (10 mg total) by mouth daily. Patient not taking: Reported on 06/30/2014 05/18/14   Jarrett Soho Muthersbaugh, PA-C  fluticasone (FLONASE) 50 MCG/ACT nasal spray Place 1 spray into both nostrils daily. Patient not taking: Reported on 05/18/2014 02/05/14   Larene Pickett, PA-C  hydrocortisone 2.5 % lotion Apply topically 2 (two) times daily. Patient not taking: Reported on 06/30/2014 05/18/14   Hannah Muthersbaugh, PA-C   BP 106/60 mmHg  Pulse 77  Temp(Src) 97.9 F (36.6 C) (Oral)  Resp 18  SpO2 95% Physical Exam  Constitutional: He is oriented to person, place, and time. He appears well-developed and well-nourished.  HENT:  Head: Normocephalic and atraumatic.  Right Ear: External ear normal.  Left Ear: External ear normal.  Mouth/Throat: Oropharynx is clear and moist. No oropharyngeal exudate.  Nasal congestion and rhinorrhea  Eyes: Conjunctivae are normal.  Neck: Neck supple.  Cardiovascular: Normal rate, regular rhythm and normal heart sounds.   Pulmonary/Chest: Effort normal. No respiratory distress. He has no wheezes. He has rales. He exhibits no tenderness.  Abdominal: Soft. There is no tenderness.  Musculoskeletal: Normal range of motion. He exhibits no edema.  Neurological: He is alert and oriented to person, place, and time.  Skin: Skin is warm and dry.  Nursing note and vitals reviewed.   ED Course  Procedures (including critical care time) Medications  ipratropium-albuterol (DUONEB) 0.5-2.5 (3) MG/3ML nebulizer solution 3 mL (3 mLs Nebulization Given 06/30/14 0930)  guaiFENesin (ROBITUSSIN) 100 MG/5ML solution 100 mg (100 mg Oral Given 06/30/14 1010)    Labs Review Labs Reviewed  CBC WITH  DIFFERENTIAL - Abnormal; Notable for the following:    WBC 10.6 (*)    Hemoglobin 11.9 (*)    HCT 37.5 (*)    MCV 75.0 (*)    MCH 23.8 (*)    Monocytes Absolute 1.2 (*)    All other components within normal limits  COMPREHENSIVE METABOLIC PANEL -  Abnormal; Notable for the following:    Albumin 3.2 (*)    All other components within normal limits  LIPASE, BLOOD  I-STAT CG4 LACTIC ACID, ED  Randolm Idol, ED    Imaging Review Dg Chest 2 View  06/30/2014   CLINICAL DATA:  Cough and congestion. Shortness of breath since last night.  EXAM: CHEST  2 VIEW  COMPARISON:  12/25/2013  FINDINGS: Cough. Minimal motion degraded lateral view. Midline trachea. Mild cardiomegaly. No pleural effusion or pneumothorax. Mild scarring at the left lung base. No lobar consolidation.  IMPRESSION: No acute cardiopulmonary disease.   Electronically Signed   By: Abigail Miyamoto M.D.   On: 06/30/2014 10:20     EKG Interpretation None      MDM   Final diagnoses:  Upper respiratory infection    Filed Vitals:   06/30/14 1128  BP: 106/60  Pulse: 77  Temp: 97.9 F (36.6 C)  Resp: 18   Afebrile, NAD, non-toxic appearing, AAOx4.  Pt CXR negative for acute infiltrate. Patients symptoms are consistent with URI, likely viral etiology. Discussed that antibiotics are not indicated for viral infections. Pt will be discharged with symptomatic treatment.  Verbalizes understanding and is agreeable with plan. Pt is hemodynamically stable & in NAD prior to dc. Patient is stable at time of discharge     Harlow Mares, PA-C 06/30/14 [place], MD 06/30/14 [phone]

## 2014-06-30 NOTE — Discharge Instructions (Signed)
Please follow up with your primary care physician in 1-2 days. If you do not have one please call the [facility] number listed above. Please read all discharge instructions and return precautions.  ° °Upper Respiratory Infection, Adult °An upper respiratory infection (URI) is also sometimes known as the common cold. The upper respiratory tract includes the nose, sinuses, throat, trachea, and bronchi. Bronchi are the airways leading to the lungs. Most people improve within 1 week, but symptoms can last up to 2 weeks. A residual cough may last even longer.  °CAUSES °Many different viruses can infect the tissues lining the upper respiratory tract. The tissues become irritated and inflamed and often become very moist. Mucus production is also common. A cold is contagious. You can easily spread the virus to others by oral contact. This includes kissing, sharing a glass, coughing, or sneezing. Touching your mouth or nose and then touching a surface, which is then touched by another person, can also spread the virus. °SYMPTOMS  °Symptoms typically develop 1 to 3 days after you come in contact with a cold virus. Symptoms vary from person to person. They may include: °· Runny nose. °· Sneezing. °· Nasal congestion. °· Sinus irritation. °· Sore throat. °· Loss of voice (laryngitis). °· Cough. °· Fatigue. °· Muscle aches. °· Loss of appetite. °· Headache. °· Low-grade fever. °DIAGNOSIS  °You might diagnose your own cold based on familiar symptoms, since most people get a cold 2 to 3 times a year. Your caregiver can confirm this based on your exam. Most importantly, your caregiver can check that your symptoms are not due to another disease such as strep throat, sinusitis, pneumonia, asthma, or epiglottitis. Blood tests, throat tests, and X-rays are not necessary to diagnose a common cold, but they may sometimes be helpful in excluding other more serious diseases. Your caregiver will decide if any further  tests are required. °RISKS AND COMPLICATIONS  °You may be at risk for a more severe case of the common cold if you smoke cigarettes, have chronic heart disease (such as heart failure) or lung disease (such as asthma), or if you have a weakened immune system. The very young and very old are also at risk for more serious infections. Bacterial sinusitis, middle ear infections, and bacterial pneumonia can complicate the common cold. The common cold can worsen asthma and chronic obstructive pulmonary disease (COPD). Sometimes, these complications can require emergency medical care and may be life-threatening. °PREVENTION  °The best way to protect against getting a cold is to practice good hygiene. Avoid oral or hand contact with people with cold symptoms. Wash your hands often if contact occurs. There is no clear evidence that vitamin C, vitamin E, echinacea, or exercise reduces the chance of developing a cold. However, it is always recommended to get plenty of rest and practice good nutrition. °TREATMENT  °Treatment is directed at relieving symptoms. There is no cure. Antibiotics are not effective, because the infection is caused by a virus, not by bacteria. Treatment may include: °· Increased fluid intake. Sports drinks offer valuable electrolytes, sugars, and fluids. °· Breathing heated mist or steam (vaporizer or shower). °· Eating chicken soup or other clear broths, and maintaining good nutrition. °· Getting plenty of rest. °· Using gargles or lozenges for comfort. °· Controlling fevers with ibuprofen or acetaminophen as directed by your caregiver. °· Increasing usage of your inhaler if you have asthma. °Zinc gel and zinc lozenges, taken in the first 24 hours of the   common cold, can shorten the duration and lessen the severity of symptoms. Pain medicines may help with fever, muscle aches, and throat pain. A variety of non-prescription medicines are available to treat congestion and runny nose. Your caregiver can  make recommendations and may suggest nasal or lung inhalers for other symptoms.  °HOME CARE INSTRUCTIONS  °· Only take over-the-counter or prescription medicines for pain, discomfort, or fever as directed by your caregiver. °· Use a warm mist humidifier or inhale steam from a shower to increase air moisture. This may keep secretions moist and make it easier to breathe. °· Drink enough water and fluids to keep your urine clear or pale yellow. °· Rest as needed. °· Return to work when your temperature has returned to normal or as your caregiver advises. You may need to stay home longer to avoid infecting others. You can also use a face mask and careful hand washing to prevent spread of the virus. °SEEK MEDICAL CARE IF:  °· After the first few days, you feel you are getting worse rather than better. °· You need your caregiver's advice about medicines to control symptoms. °· You develop chills, worsening shortness of breath, or brown or red sputum. These may be signs of pneumonia. °· You develop yellow or brown nasal discharge or pain in the face, especially when you bend forward. These may be signs of sinusitis. °· You develop a fever, swollen neck glands, pain with swallowing, or white areas in the back of your throat. These may be signs of strep throat. °SEEK IMMEDIATE MEDICAL CARE IF:  °· You have a fever. °· You develop severe or persistent headache, ear pain, sinus pain, or chest pain. °· You develop wheezing, a prolonged cough, cough up blood, or have a change in your usual mucus (if you have chronic lung disease). °· You develop sore muscles or a stiff neck. °Document Released: 12/05/2000 Document Revised: 09/03/2011 Document Reviewed: 09/16/2013 °[employer]® Patient Information ©2015 [employer]. This information is not intended to replace advice given to you by your health care provider. Make sure you discuss any questions you have with your health care provider. ° °

## 2014-06-30 NOTE — ED Notes (Signed)
Standing 97% room air HR 100.  While ambulating around Nursing station twice, 95% room air HR 106.  Patient stated he was fine.

## 2014-06-30 NOTE — ED Notes (Signed)
NAD at this time. Pt is stable and calling his nephew to pick him up.

## 2014-06-30 NOTE — ED Notes (Signed)
Pt in c/o cough, nasal congestion and right abd pain, symptoms for two days

## 2014-07-07 ENCOUNTER — Emergency Department (HOSPITAL_COMMUNITY): Payer: Medicare Other

## 2014-07-07 ENCOUNTER — Encounter (HOSPITAL_COMMUNITY): Payer: Self-pay | Admitting: Emergency Medicine

## 2014-07-07 ENCOUNTER — Inpatient Hospital Stay (HOSPITAL_COMMUNITY)
Admission: EM | Admit: 2014-07-07 | Discharge: 2014-07-12 | DRG: 195 | Disposition: A | Discharge status: Home / Self Care | Payer: Medicare Other | Attending: Internal Medicine | Admitting: Internal Medicine

## 2014-07-07 DIAGNOSIS — M179 Osteoarthritis of knee, unspecified: Secondary | ICD-10-CM | POA: Diagnosis present

## 2014-07-07 DIAGNOSIS — R109 Unspecified abdominal pain: Secondary | ICD-10-CM | POA: Diagnosis not present

## 2014-07-07 DIAGNOSIS — Z7951 Long term (current) use of inhaled steroids: Secondary | ICD-10-CM | POA: Diagnosis not present

## 2014-07-07 DIAGNOSIS — M25561 Pain in right knee: Secondary | ICD-10-CM

## 2014-07-07 DIAGNOSIS — R600 Localized edema: Secondary | ICD-10-CM | POA: Diagnosis not present

## 2014-07-07 DIAGNOSIS — I10 Essential (primary) hypertension: Secondary | ICD-10-CM | POA: Diagnosis present

## 2014-07-07 DIAGNOSIS — K8020 Calculus of gallbladder without cholecystitis without obstruction: Secondary | ICD-10-CM | POA: Diagnosis not present

## 2014-07-07 DIAGNOSIS — Z888 Allergy status to other drugs, medicaments and biological substances status: Secondary | ICD-10-CM | POA: Diagnosis not present

## 2014-07-07 DIAGNOSIS — J45909 Unspecified asthma, uncomplicated: Secondary | ICD-10-CM | POA: Diagnosis present

## 2014-07-07 DIAGNOSIS — M1711 Unilateral primary osteoarthritis, right knee: Secondary | ICD-10-CM

## 2014-07-07 DIAGNOSIS — E785 Hyperlipidemia, unspecified: Secondary | ICD-10-CM | POA: Diagnosis present

## 2014-07-07 DIAGNOSIS — J9811 Atelectasis: Secondary | ICD-10-CM | POA: Diagnosis not present

## 2014-07-07 DIAGNOSIS — M109 Gout, unspecified: Secondary | ICD-10-CM | POA: Diagnosis present

## 2014-07-07 DIAGNOSIS — R05 Cough: Secondary | ICD-10-CM | POA: Diagnosis not present

## 2014-07-07 DIAGNOSIS — J189 Pneumonia, unspecified organism: Secondary | ICD-10-CM | POA: Diagnosis present

## 2014-07-07 DIAGNOSIS — N281 Cyst of kidney, acquired: Secondary | ICD-10-CM | POA: Diagnosis present

## 2014-07-07 DIAGNOSIS — R0902 Hypoxemia: Secondary | ICD-10-CM | POA: Diagnosis present

## 2014-07-07 DIAGNOSIS — M25461 Effusion, right knee: Secondary | ICD-10-CM | POA: Diagnosis present

## 2014-07-07 DIAGNOSIS — M79671 Pain in right foot: Secondary | ICD-10-CM | POA: Diagnosis not present

## 2014-07-07 DIAGNOSIS — R918 Other nonspecific abnormal finding of lung field: Secondary | ICD-10-CM | POA: Diagnosis not present

## 2014-07-07 DIAGNOSIS — M19071 Primary osteoarthritis, right ankle and foot: Secondary | ICD-10-CM | POA: Diagnosis not present

## 2014-07-07 DIAGNOSIS — M7989 Other specified soft tissue disorders: Secondary | ICD-10-CM | POA: Diagnosis not present

## 2014-07-07 DIAGNOSIS — R471 Dysarthria and anarthria: Secondary | ICD-10-CM | POA: Diagnosis present

## 2014-07-07 DIAGNOSIS — R1084 Generalized abdominal pain: Secondary | ICD-10-CM | POA: Diagnosis present

## 2014-07-07 DIAGNOSIS — R0602 Shortness of breath: Secondary | ICD-10-CM | POA: Diagnosis not present

## 2014-07-07 LAB — COMPREHENSIVE METABOLIC PANEL
ALK PHOS: 48 U/L (ref 39–117)
ALT: 8 U/L (ref 0–53)
AST: 26 U/L (ref 0–37)
Albumin: 3.1 g/dL — ABNORMAL LOW (ref 3.5–5.2)
Anion gap: 14 (ref 5–15)
BILIRUBIN TOTAL: 2.1 mg/dL — AB (ref 0.3–1.2)
BUN: 6 mg/dL (ref 6–23)
CO2: 27 mmol/L (ref 19–32)
Calcium: 9.6 mg/dL (ref 8.4–10.5)
Chloride: 98 mEq/L (ref 96–112)
Creatinine, Ser: 0.94 mg/dL (ref 0.50–1.35)
GFR calc Af Amer: >90 mL/min (ref >90)
GFR calc non Af Amer: >90 mL/min (ref >90)
Glucose, Bld: 92 mg/dL (ref 70–99)
POTASSIUM: 5.2 mmol/L — AB (ref 3.5–5.1)
SODIUM: 139 mmol/L (ref 135–145)
Total Protein: 8.4 g/dL — ABNORMAL HIGH (ref 6.0–8.3)

## 2014-07-07 LAB — CBC WITH DIFFERENTIAL/PLATELET
Basophils Absolute: 0 10*3/uL (ref 0.0–0.1)
Basophils Relative: 0 % (ref 0–1)
Eosinophils Absolute: 0 10*3/uL (ref 0.0–0.7)
Eosinophils Relative: 0 % (ref 0–5)
HCT: 38.8 % — ABNORMAL LOW (ref 39.0–52.0)
Hemoglobin: 12.6 g/dL — ABNORMAL LOW (ref 13.0–17.0)
LYMPHS ABS: 2.2 10*3/uL (ref 0.7–4.0)
LYMPHS PCT: 19 % (ref 12–46)
MCH: 24.4 pg — ABNORMAL LOW (ref 26.0–34.0)
MCHC: 32.5 g/dL (ref 30.0–36.0)
MCV: 75 fL — AB (ref 78.0–100.0)
Monocytes Absolute: 1 10*3/uL (ref 0.1–1.0)
Monocytes Relative: 9 % (ref 3–12)
NEUTROS ABS: 8.2 10*3/uL — AB (ref 1.7–7.7)
NEUTROS PCT: 72 % (ref 43–77)
PLATELETS: 288 10*3/uL (ref 150–400)
RBC: 5.17 MIL/uL (ref 4.22–5.81)
RDW: 14.8 % (ref 11.5–15.5)
WBC: 11.4 10*3/uL — AB (ref 4.0–10.5)

## 2014-07-07 LAB — D-DIMER, QUANTITATIVE (NOT AT ARMC): D DIMER QUANT: 0.88 ug{FEU}/mL — AB (ref 0.00–0.48)

## 2014-07-07 LAB — TROPONIN I

## 2014-07-07 LAB — BRAIN NATRIURETIC PEPTIDE: B Natriuretic Peptide: 18.7 pg/mL (ref 0.0–100.0)

## 2014-07-07 MED ORDER — SODIUM CHLORIDE 0.9 % IV SOLN
INTRAVENOUS | Status: AC
  Administered 2014-07-07: 23:00:00 via INTRAVENOUS

## 2014-07-07 MED ORDER — DEXTROSE 5 % IV SOLN
1.0000 g | Freq: Once | INTRAVENOUS | Status: AC
  Administered 2014-07-08: 1 g via INTRAVENOUS
  Filled 2014-07-07: qty 10

## 2014-07-07 MED ORDER — SODIUM CHLORIDE 0.9 % IV BOLUS (SEPSIS)
500.0000 mL | Freq: Once | INTRAVENOUS | Status: AC
  Administered 2014-07-07: 500 mL via INTRAVENOUS

## 2014-07-07 MED ORDER — DEXTROSE 5 % IV SOLN: 500.0000 mg | INTRAVENOUS | Status: DC

## 2014-07-07 MED ORDER — IOHEXOL 350 MG/ML SOLN
100.0000 mL | Freq: Once | INTRAVENOUS | Status: AC | PRN
  Administered 2014-07-07: 76 mL via INTRAVENOUS

## 2014-07-07 MED ORDER — HYDRALAZINE HCL 20 MG/ML IJ SOLN: 10.0000 mg | INTRAMUSCULAR | Status: DC | PRN

## 2014-07-07 MED ORDER — CEFTRIAXONE SODIUM IN DEXTROSE 20 MG/ML IV SOLN: 1.0000 g | Freq: Every day | INTRAVENOUS | Status: DC

## 2014-07-07 MED ORDER — INFLUENZA VAC SPLIT QUAD 0.5 ML IM SUSY
0.5000 mL | PREFILLED_SYRINGE | INTRAMUSCULAR | Status: AC
  Administered 2014-07-09: 0.5 mL via INTRAMUSCULAR
  Filled 2014-07-07: qty 0.5

## 2014-07-07 MED ORDER — AZITHROMYCIN 500 MG IV SOLR
500.0000 mg | Freq: Once | INTRAVENOUS | Status: AC
  Administered 2014-07-08: 500 mg via INTRAVENOUS
  Filled 2014-07-07: qty 500

## 2014-07-07 NOTE — ED Notes (Signed)
Per Hal Hope, MD pt can eat.

## 2014-07-07 NOTE — ED Provider Notes (Signed)
CSN: [id number]     Arrival date & time 07/07/14  1450 History   None    Chief Complaint  Patient presents with  . Leg Pain     (Consider location/radiation/quality/duration/timing/severity/associated sxs/prior Treatment) The history is provided by the patient. No language interpreter was used.  Roberto Horne is a 54 year old male with past medical history of hypertension, asthma, hyperlipidemia, Presenting to emergency department with right knee pain and right ankle pain that started abruptly this morning and woke the patient out of a sleep. Reported that the pain is a constant dull aching sensation that radiates from the knee down to the right ankle. Reported that he noticed swelling to the right knee and ankle. Stated he has never experienced pain like this before. Denied using anything over-the-counter for pain relief. Stated that he hopped to the emergency department. Reported that he's been having nasal congestion and feeling short of breath intermittently over the past couple of days. Denied skin changes, hot to touch, fever, chills, fall, injury, loss of sensation, numbness, tingling, weakness, back pain, travels, chest pain, difficulty breathing, cough, dizziness, headache, blurred vision, sudden loss of vision. PCP Dr. Jarold Horne  Past Medical History  Diagnosis Date  . Hypertension   . Asthma   . Hyperlipemia   . Gout    History reviewed. No pertinent past surgical history. History reviewed. No pertinent family history. History  Substance Use Topics  . Smoking status: Never Smoker   . Smokeless tobacco: Not on file  . Alcohol Use: No    Review of Systems  Constitutional: Negative for fever and chills.  Respiratory: Positive for shortness of breath. Negative for chest tightness.   Cardiovascular: Negative for chest pain.  Musculoskeletal: Positive for joint swelling and arthralgias (right knee and ankle pain). Negative for back pain, neck pain and neck stiffness.  Skin:  Negative for color change.  Neurological: Negative for dizziness, weakness, numbness and headaches.      Allergies  Ibuprofen  Home Medications   Prior to Admission medications   Medication Sig Start Date End Date Taking? Authorizing Provider  sodium chloride (OCEAN) 0.65 % SOLN nasal spray Place 1 spray into both nostrils 2 (two) times daily.   Yes Historical Provider, MD  acetaminophen (TYLENOL) 500 MG tablet Take 1 tablet (500 mg total) by mouth every 6 (six) hours as needed. Patient not taking: Reported on 06/30/2014 12/25/13   Linus Mako, PA-C  cetirizine (ZYRTEC ALLERGY) 10 MG tablet Take 1 tablet (10 mg total) by mouth daily. Patient not taking: Reported on 06/30/2014 05/18/14   Jarrett Soho Muthersbaugh, PA-C  fluticasone (FLONASE) 50 MCG/ACT nasal spray Place 1 spray into both nostrils daily. Patient not taking: Reported on 05/18/2014 02/05/14   Larene Pickett, PA-C  hydrocortisone 2.5 % lotion Apply topically 2 (two) times daily. Patient not taking: Reported on 06/30/2014 05/18/14   Hannah Muthersbaugh, PA-C   BP 101/75 mmHg  Pulse 100  Temp(Src) 98 F (36.7 C) (Oral)  Resp 18  SpO2 94% Physical Exam  Constitutional: He is oriented to person, place, and time. He appears well-developed and well-nourished. No distress.  HENT:  Head: Normocephalic and atraumatic.  Mouth/Throat: Oropharynx is clear and moist. No oropharyngeal exudate.  Eyes: Conjunctivae and EOM are normal. Right eye exhibits no discharge. Left eye exhibits no discharge.  Neck: Normal range of motion. Neck supple.  Cardiovascular: Regular rhythm and normal heart sounds.   Pulses:      Radial pulses are 2+ on the right side,  and 2+ on the left side.       Dorsalis pedis pulses are 2+ on the right side, and 2+ on the left side.  Tachycardic upon auscultation Cap refill less than 3 seconds  Pulmonary/Chest: Effort normal and breath sounds normal. No respiratory distress. He has no wheezes. He has no rales. He  exhibits no tenderness.  Abdominal:  Obese  Musculoskeletal: Normal range of motion. He exhibits edema and tenderness.       Right knee: He exhibits swelling. He exhibits normal range of motion, no effusion, no ecchymosis, no deformity and no laceration. Tenderness found.       Right ankle: He exhibits swelling. He exhibits normal range of motion, no deformity and no laceration. Tenderness. Lateral malleolus tenderness found.       Legs:      Feet:  Moderate swelling identified to the right knee with negative erythema, inflammation, warmth upon palpation, lesions, sores. Negative malalignment or deformities identified to the knee. Discomfort upon palpation to the anterior aspect of the right knee. Mild swelling identified to the lateral malleolus of the right ankle with mild discomfort upon palpation. Negative erythema, warmth upon palpation, lesions, sores, performance identified. Patient is able to flex and extend the right knee without difficulty. Full range of motion to the right ankle without difficulty. Patient able to wiggle toes without difficulty.  Neurological: He is alert and oriented to person, place, and time. No cranial nerve deficit. He exhibits normal muscle tone. Coordination normal.  Cranial nerves III-XII grossly intact Strength 5+/5+ to upper and lower extremities bilaterally with resistance applied, equal distribution noted Sensation intact with differentiation sharp and dull touch  Skin: Skin is warm and dry. No rash noted. He is not diaphoretic. No erythema.  Psychiatric: He has a normal mood and affect. His behavior is normal. Thought content normal.  Nursing note and vitals reviewed.   ED Course  Procedures (including critical care time)  Results for orders placed or performed during the hospital encounter of 07/07/14  CBC with Differential  Result Value Ref Range   WBC 11.4 (H) 4.0 - 10.5 K/uL   RBC 5.17 4.22 - 5.81 MIL/uL   Hemoglobin 12.6 (L) 13.0 - 17.0 g/dL    HCT 38.8 (L) 39.0 - 52.0 %   MCV 75.0 (L) 78.0 - 100.0 fL   MCH 24.4 (L) 26.0 - 34.0 pg   MCHC 32.5 30.0 - 36.0 g/dL   RDW 14.8 11.5 - 15.5 %   Platelets 288 150 - 400 K/uL   Neutrophils Relative % 72 43 - 77 %   Neutro Abs 8.2 (H) 1.7 - 7.7 K/uL   Lymphocytes Relative 19 12 - 46 %   Lymphs Abs 2.2 0.7 - 4.0 K/uL   Monocytes Relative 9 3 - 12 %   Monocytes Absolute 1.0 0.1 - 1.0 K/uL   Eosinophils Relative 0 0 - 5 %   Eosinophils Absolute 0.0 0.0 - 0.7 K/uL   Basophils Relative 0 0 - 1 %   Basophils Absolute 0.0 0.0 - 0.1 K/uL  Comprehensive metabolic panel  Result Value Ref Range   Sodium 139 135 - 145 mmol/L   Potassium 5.2 (H) 3.5 - 5.1 mmol/L   Chloride 98 96 - 112 mEq/L   CO2 27 19 - 32 mmol/L   Glucose, Bld 92 70 - 99 mg/dL   BUN 6 6 - 23 mg/dL   Creatinine, Ser 0.94 0.50 - 1.35 mg/dL   Calcium 9.6 8.4 - 10.5  mg/dL   Total Protein 8.4 (H) 6.0 - 8.3 g/dL   Albumin 3.1 (L) 3.5 - 5.2 g/dL   AST 26 0 - 37 U/L   ALT 8 0 - 53 U/L   Alkaline Phosphatase 48 39 - 117 U/L   Total Bilirubin 2.1 (H) 0.3 - 1.2 mg/dL   GFR calc non Af Amer >90 >90 mL/min   GFR calc Af Amer >90 >90 mL/min   Anion gap 14 5 - 15  Troponin I  Result Value Ref Range   Troponin I <0.03 <0.031 ng/mL  Brain natriuretic peptide  Result Value Ref Range   B Natriuretic Peptide 18.7 0.0 - 100.0 pg/mL  D-dimer, quantitative  Result Value Ref Range   D-Dimer, Quant 0.88 (H) 0.00 - 0.48 ug/mL-FEU    Labs Review Labs Reviewed  CBC WITH DIFFERENTIAL - Abnormal; Notable for the following:    WBC 11.4 (*)    Hemoglobin 12.6 (*)    HCT 38.8 (*)    MCV 75.0 (*)    MCH 24.4 (*)    Neutro Abs 8.2 (*)    All other components within normal limits  COMPREHENSIVE METABOLIC PANEL - Abnormal; Notable for the following:    Potassium 5.2 (*)    Total Protein 8.4 (*)    Albumin 3.1 (*)    Total Bilirubin 2.1 (*)    All other components within normal limits  D-DIMER, QUANTITATIVE - Abnormal; Notable for the  following:    D-Dimer, Quant 0.88 (*)    All other components within normal limits  CULTURE, BLOOD (ROUTINE X 2)  CULTURE, BLOOD (ROUTINE X 2)  TROPONIN I  BRAIN NATRIURETIC PEPTIDE    Imaging Review Dg Chest 2 View  07/07/2014   CLINICAL DATA:  Pain and swelling RIGHT knee down RIGHT leg to foot and ankle, history asthma, hypertension, hyperlipidemia  EXAM: CHEST  2 VIEW  COMPARISON:  06/30/2014  FINDINGS: Normal heart size, mediastinal contours and pulmonary vascularity.  Mild streaky atelectasis in retrocardiac LEFT lower lobe on lateral view.  Remaining lungs clear.  No pleural effusion or pneumothorax.  Bones unremarkable.  IMPRESSION: Streaky atelectasis LEFT lower lobe.   Electronically Signed   By: Lavonia Dana M.D.   On: 07/07/2014 18:17   Dg Ankle Complete Right  07/07/2014   CLINICAL DATA:  Right foot and ankle pain.  No known injury.  EXAM: RIGHT ANKLE - COMPLETE 3+ VIEW  COMPARISON:  Right ankle 12/03/2009  FINDINGS: No fracture or dislocation. The alignment is maintained. The ankle mortise is preserved. Increased osteophytes at the tibiotalar articulation. Unchanged subtalar degenerative change. Proliferative change noted in the midfoot. No erosion or periosteal reaction. Minimal soft tissue edema.  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 No acute osseous abnormality.   Electronically Signed   By: Jeb Levering M.D.   On: 07/07/2014 18:18   Ct Angio Chest Pe W/cm &/or Wo Cm  07/07/2014   CLINICAL DATA:  Shortness of breath, RIGHT leg pain and swelling, history asthma, hypertension, hyperlipidemia  EXAM: CT ANGIOGRAPHY CHEST WITH CONTRAST  TECHNIQUE: Multidetector CT imaging of the chest was performed using the standard protocol during bolus administration of intravenous contrast. Multiplanar CT image reconstructions and MIPs were obtained to evaluate the vascular anatomy.  CONTRAST:  40mL OMNIPAQUE IOHEXOL 350 MG/ML SOLN  COMPARISON:  None  FINDINGS: Aorta normal  caliber without aneurysm or dissection.  Large intermediate attenuation mass at upper pole LEFT kidney question mildly complicated cyst 5.1 x 5.0  cm image 137.  Patchiness appearance of RIGHT nephrogram.  No thoracic adenopathy.  Respiratory motion artifacts in the lower lobes.  Pulmonary arteries minimally prominent size centrally.  No definite pulmonary emboli identified within limitations of motion.  Patchy opacity in the posterior RIGHT lower lobe base may represent infiltrate but requiring followup until resolution to exclude underlying nodules.  Mild atelectasis in infiltrate in LEFT lower lobe and lingular base with focal area of infiltrate versus nodularity in LEFT mid lung 8 mm diameter image 59.  No pleural effusion or pneumothorax.  Collapsed hiatal hernia demonstrating slightly thickened wall though this could be an artifact from underdistention.  Bones unremarkable.  Review of the MIP images confirms the above findings.  IMPRESSION: No definite evidence of pulmonary embolism.  Patchy areas of opacity in the lower lobes as well as in the lingula and LEFT mid lung, likely infiltrate the requiring followup CT assessment in 3-4 months to exclude underlying pulmonary nodules.  Patchy appearance of RIGHT nephrogram, potentially related to beam hardening artifacts but unable to exclude pyelonephritis ; correlation with urinalysis recommended.  5.1 x 5.0 cm diameter intermediate attenuation lesion at the LEFT kidney, incompletely visualized, recommend followup sonographic assessment to exclude cystic tumor.   Electronically Signed   By: Lavonia Dana M.D.   On: 07/07/2014 20:42   Dg Knee Complete 4 Views Right  07/07/2014   CLINICAL DATA:  Right knee pain and swelling. Pain radiates down leg. No known injury.  EXAM: RIGHT KNEE - COMPLETE 4+ VIEW  COMPARISON:  None.  FINDINGS: No fracture or dislocation. The alignment is maintained. Peripheral osteophytes of the medial greater than latter tibial femoral  compartment. No significant joint space narrowing. No osseous erosions. There is a moderately large joint effusion and anterior soft tissue edema.  IMPRESSION: 1. Mild-to-moderate osteoarthritis.  No acute bony abnormality. 2. Moderately large joint effusion, mild anterior soft tissue edema.   Electronically Signed   By: Jeb Levering M.D.   On: 07/07/2014 18:15   Dg Foot Complete Right  07/07/2014   CLINICAL DATA:  Swelling without trauma.  Pain.  EXAM: RIGHT FOOT COMPLETE - 3+ VIEW  COMPARISON:  12/03/2009  FINDINGS: Mild diffuse soft tissue swelling, especially about the forefoot. Scattered degenerate changes, including about the midfoot anteriorly. No soft tissue gas. No radiopaque foreign object. No osseous destruction.  IMPRESSION: Soft tissue swelling, without acute finding.   Electronically Signed   By: Abigail Miyamoto M.D.   On: 07/07/2014 18:16     EKG Interpretation   Date/Time:  Wednesday July 07 2014 17:01:27 EST Ventricular Rate:  102 PR Interval:  186 QRS Duration: 81 QT Interval:  333 QTC Calculation: 434 R Axis:   84 Text Interpretation:  Sinus tachycardia Otherwise within normal limits  When compared with ECG of 10/24/2010, HEART RATE has increased Confirmed by  [facility]  MD, DAVID ([id number]) on 07/07/2014 5:14:05 PM        *PRELIMINARY RESULTS* Vascular Ultrasound Right lower extremity venous duplex has been completed. Preliminary findings: No evidence of DVT  Landry Mellow, RDMS, RVT 07/07/2014, 6:31 PM   9:04 PM This provider discussed case in great detail with Dr. Hal Hope, [facility]. Recommended blood cultures to be obtained. Agreed with IV antibiotics. Patient to be admitted to the hospital for community-acquired pneumonia, admitted to telemetry floor.   MDM   Final diagnoses:  CAP (community acquired pneumonia)  Hypoxia  Right knee pain  Osteoarthritis of right knee, unspecified osteoarthritis type  Medications  sodium chloride 0.9 % bolus 500  mL (not administered)  cefTRIAXone (ROCEPHIN) 1 g in dextrose 5 % 50 mL IVPB (not administered)  azithromycin (ZITHROMAX) 500 mg in dextrose 5 % 250 mL IVPB (not administered)  iohexol (OMNIPAQUE) 350 MG/ML injection 100 mL (76 mLs Intravenous Contrast Given 07/07/14 2013)    Filed Vitals:   07/07/14 1730 07/07/14 1915 07/07/14 1930 07/07/14 2000  BP: 116/75 110/72 109/76 101/75  Pulse: 95 99 98 100  Temp:      TempSrc:      Resp: 23 16 15 18   SpO2: 96% 91% 94% 94%   EKG sinus tachycardia with a heart rate of 102 bpm-and he can change since last tracing except for heart rate. Troponin negative elevation. D-dimer elevated at 0.88. BNP 18.7. CBC negative noted a moderately elevated leukocytosis 11.4. Hemoglobin 12.6, hematocrit 38.8. CMP noted hyperkalemia 5.2. Bilirubin elevated at 2.1. Glucose 92, anion gap negative elevation-14.0 mg/L. Chest x-ray identified streaky atelectasis in the left lower lobe. Plain film of right knee noted mild to moderate osteoarthritis with moderately large joint effusion with mild anterior soft tissue edema. Plain film of right ankle noted progressive osteoarthritis at the ankle joint with no acute abnormalities. Plain film of right foot noted soft tissue swelling without acute findings. Right lower extremity Doppler no evidence of DVT. CT angiogram of the chest with contrast no evidence of a pulmonary embolism. Patchy areas of opacity in the left lower lobe as well as the lingual and left midlobe-likely infiltrate.  Doppler of right lower extremity negative for acute blood clot. Suspicion of knee pain and ankle pain secondary to osteophyte arthritis is noted on plain film-patient placed in knee sleeve for comfort purposes. CT angiogram of chest negative for PE, findings consistent with infiltrate of the left midlobe-suspicion high for pneumonia secondary to shortness of breath, cough, hypoxia. Patient hypoxic in ED setting with pulse ox on room air dropping to 85% on  room air-patient placed on nasal cannula, 3.5 L/m. Upon arrival to the ED patient is tachycardic at 130 bpm, has decreased to approximately 99 bpm while in ED setting. Patient started on IV fluids and IV antibiotics in ED setting. Blood cultures to be obtained. Patient to be images hospital, telemetry floor under the care of [facility]. Discussed plan of admission with patient in great detail agrees to plan of care. Patient stable, afebrile-patient not septic appearing. Patient stable for transfer.  Jamse Mead, PA-C 07/07/14 2117  Shaune Pollack, MD 07/08/14 0010

## 2014-07-07 NOTE — ED Notes (Signed)
PA at bedside.

## 2014-07-07 NOTE — ED Notes (Signed)
Pt placed on 3.5L

## 2014-07-07 NOTE — Progress Notes (Signed)
Pt admitted to the unit. Pt is alert and oriented. Pt oriented to room, staff, and call bell. Educated on fall safety plan. Bed in lowest position. Full assessment to Epic. Call bell with in reach. Educated to call for assists. Will continue to monitor. Keishawn Darsey, RN 

## 2014-07-07 NOTE — ED Notes (Signed)
Pt sts right leg pain from knee to ankle; pt denies swelling and sts started last night

## 2014-07-07 NOTE — Progress Notes (Signed)
*  PRELIMINARY RESULTS* Vascular Ultrasound Right lower extremity venous duplex has been completed.  Preliminary findings: No evidence of DVT  Landry Mellow, RDMS, RVT  07/07/2014, 6:31 PM

## 2014-07-07 NOTE — ED Notes (Signed)
[place], MD at bedside 

## 2014-07-07 NOTE — ED Notes (Signed)
Patient transported to X-ray 

## 2014-07-07 NOTE — H&P (Signed)
[facility] History and Physical  Roberto Horne PZW:[id number] DOB: September 23, 1960 DOA: 07/07/2014  Referring physician: ER physician. PCP: Roberto Morale, MD   Chief Complaint: Right knee swelling and abdominal pain.  HPI: Roberto Horne is a 54 y.o. male with history of hypertension and possible gout presents to the ER because of worsening right knee swelling over the last 2 days. Patient also has been recently in the ER for abdominal discomfort and at that time patient also had nasal congestion. Patient states that he also has been feeling short of breath over the last 1 week. Shortness of breath is mostly exertional but some nonproductive cough. Patient states whenever he eats he gets epigastric discomfort. Abdomen is mildly tender. In the ER patient's d-dimer was mildly elevated and CT abdomen of the chest was done which was negative for PE but did show left-sided pneumonia. In addition it also showed left renal cyst which needs further tests. Patient has been initially tachycardic which improved with IV fluids and has been admitted for further management. Doppler done in the ER was negative for DVT of the right lower extremity as per the ER physician.  Review of Systems: As presented in the history of presenting illness, rest negative.  Past Medical History  Diagnosis Date  . Hypertension   . Asthma   . Hyperlipemia   . Gout    Past Surgical History  Procedure Laterality Date  . Brain surgery     Social History:  reports that he has never smoked. He does not have any smokeless tobacco history on file. He reports that he does not drink alcohol or use illicit drugs. Where does patient live home. Can patient participate in ADLs? Yes.  Allergies  Allergen Reactions  . Ibuprofen Itching    Family History: History reviewed. No pertinent family history.    Prior to Admission medications   Medication Sig Start Date End Date Taking? Authorizing Provider  sodium chloride (OCEAN)  0.65 % SOLN nasal spray Place 1 spray into both nostrils 2 (two) times daily.   Yes Historical Provider, MD  acetaminophen (TYLENOL) 500 MG tablet Take 1 tablet (500 mg total) by mouth every 6 (six) hours as needed. Patient not taking: Reported on 06/30/2014 12/25/13   Linus Mako, PA-C  cetirizine (ZYRTEC ALLERGY) 10 MG tablet Take 1 tablet (10 mg total) by mouth daily. Patient not taking: Reported on 06/30/2014 05/18/14   Jarrett Soho Muthersbaugh, PA-C  fluticasone (FLONASE) 50 MCG/ACT nasal spray Place 1 spray into both nostrils daily. Patient not taking: Reported on 05/18/2014 02/05/14   Larene Pickett, PA-C  hydrocortisone 2.5 % lotion Apply topically 2 (two) times daily. Patient not taking: Reported on 06/30/2014 05/18/14   Abigail Butts, PA-C    Physical Exam: Filed Vitals:   07/07/14 1730 07/07/14 1915 07/07/14 1930 07/07/14 2000  BP: 116/75 110/72 109/76 101/75  Pulse: 95 99 98 100  Temp:      TempSrc:      Resp: 23 16 15 18   SpO2: 96% 91% 94% 94%     General:  Well-developed and nourished.  Eyes: Anicteric no pallor.  ENT: No discharge from the ears eyes nose or mouth.  Neck: No mass felt. No neck rigidity.  Cardiovascular: S1-S2 heard.  Respiratory: No rhonchi or crepitations.  Abdomen: Soft mild epigastric tenderness no guarding or rigidity.  Skin: No rash.  Musculoskeletal: Swollen right knee with difficulty completely flexing the knee.  Psychiatric: Appears normal.  Neurologic: Alert awake oriented to time place  and person. Moves all extremities. 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  Labs on Admission:  Basic Metabolic Panel:  Recent Labs Lab 07/07/14 1659  NA 139  K 5.2*  CL 98  CO2 27  GLUCOSE 92  BUN 6  CREATININE 0.94  CALCIUM 9.6   Liver Function Tests:  Recent Labs Lab 07/07/14 1659  AST 26  ALT 8  ALKPHOS 48  BILITOT 2.1*  PROT 8.4*  ALBUMIN 3.1*   No results for input(s): LIPASE,  AMYLASE in the last 168 hours. No results for input(s): AMMONIA in the last 168 hours. CBC:  Recent Labs Lab 07/07/14 1659  WBC 11.4*  NEUTROABS 8.2*  HGB 12.6*  HCT 38.8*  MCV 75.0*  PLT 288   Cardiac Enzymes:  Recent Labs Lab 07/07/14 1659  TROPONINI <0.03    BNP (last 3 results) No results for input(s): PROBNP in the last 8760 hours. CBG: No results for input(s): GLUCAP in the last 168 hours.  Radiological Exams on Admission: Dg Chest 2 View  07/07/2014   CLINICAL DATA:  Pain and swelling RIGHT knee down RIGHT leg to foot and ankle, history asthma, hypertension, hyperlipidemia  EXAM: CHEST  2 VIEW  COMPARISON:  06/30/2014  FINDINGS: Normal heart size, mediastinal contours and pulmonary vascularity.  Mild streaky atelectasis in retrocardiac LEFT lower lobe on lateral view.  Remaining lungs clear.  No pleural effusion or pneumothorax.  Bones unremarkable.  IMPRESSION: Streaky atelectasis LEFT lower lobe.   Electronically Signed   By: Lavonia Dana M.D.   On: 07/07/2014 18:17   Dg Ankle Complete Right  07/07/2014   CLINICAL DATA:  Right foot and ankle pain.  No known injury.  EXAM: RIGHT ANKLE - COMPLETE 3+ VIEW  COMPARISON:  Right ankle 12/03/2009  FINDINGS: No fracture or dislocation. The alignment is maintained. The ankle mortise is preserved. Increased osteophytes at the tibiotalar articulation. Unchanged subtalar degenerative change. Proliferative change noted in the midfoot. No erosion or periosteal reaction. Minimal soft tissue edema.  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 No acute osseous abnormality.   Electronically Signed   By: Jeb Levering M.D.   On: 07/07/2014 18:18   Ct Angio Chest Pe W/cm &/or Wo Cm  07/07/2014   CLINICAL DATA:  Shortness of breath, RIGHT leg pain and swelling, history asthma, hypertension, hyperlipidemia  EXAM: CT ANGIOGRAPHY CHEST WITH CONTRAST  TECHNIQUE: Multidetector CT imaging of the chest was performed using the standard  protocol during bolus administration of intravenous contrast. Multiplanar CT image reconstructions and MIPs were obtained to evaluate the vascular anatomy.  CONTRAST:  60mL OMNIPAQUE IOHEXOL 350 MG/ML SOLN  COMPARISON:  None  FINDINGS: Aorta normal caliber without aneurysm or dissection.  Large intermediate attenuation mass at upper pole LEFT kidney question mildly complicated cyst 5.1 x 5.0 cm image 137.  Patchiness appearance of RIGHT nephrogram.  No thoracic adenopathy.  Respiratory motion artifacts in the lower lobes.  Pulmonary arteries minimally prominent size centrally.  No definite pulmonary emboli identified within limitations of motion.  Patchy opacity in the posterior RIGHT lower lobe base may represent infiltrate but requiring followup until resolution to exclude underlying nodules.  Mild atelectasis in infiltrate in LEFT lower lobe and lingular base with focal area of infiltrate versus nodularity in LEFT mid lung 8 mm diameter image 59.  No pleural effusion or pneumothorax.  Collapsed hiatal hernia demonstrating slightly thickened wall though this could be an artifact from underdistention.  Bones unremarkable.  Review of the MIP images confirms the above findings.  IMPRESSION: No definite evidence of pulmonary embolism.  Patchy areas of opacity in the lower lobes as well as in the lingula and LEFT mid lung, likely infiltrate the requiring followup CT assessment in 3-4 months to exclude underlying pulmonary nodules.  Patchy appearance of RIGHT nephrogram, potentially related to beam hardening artifacts but unable to exclude pyelonephritis ; correlation with urinalysis recommended.  5.1 x 5.0 cm diameter intermediate attenuation lesion at the LEFT kidney, incompletely visualized, recommend followup sonographic assessment to exclude cystic tumor.   Electronically Signed   By: Lavonia Dana M.D.   On: 07/07/2014 20:42   Dg Knee Complete 4 Views Right  07/07/2014   CLINICAL DATA:  Right knee pain and  swelling. Pain radiates down leg. No known injury.  EXAM: RIGHT KNEE - COMPLETE 4+ VIEW  COMPARISON:  None.  FINDINGS: No fracture or dislocation. The alignment is maintained. Peripheral osteophytes of the medial greater than latter tibial femoral compartment. No significant joint space narrowing. No osseous erosions. There is a moderately large joint effusion and anterior soft tissue edema.  IMPRESSION: 1. Mild-to-moderate osteoarthritis.  No acute bony abnormality. 2. Moderately large joint effusion, mild anterior soft tissue edema.   Electronically Signed   By: Jeb Levering M.D.   On: 07/07/2014 18:15   Dg Foot Complete Right  07/07/2014   CLINICAL DATA:  Swelling without trauma.  Pain.  EXAM: RIGHT FOOT COMPLETE - 3+ VIEW  COMPARISON:  12/03/2009  FINDINGS: Mild diffuse soft tissue swelling, especially about the forefoot. Scattered degenerate changes, including about the midfoot anteriorly. No soft tissue gas. No radiopaque foreign object. No osseous destruction.  IMPRESSION: Soft tissue swelling, without acute finding.   Electronically Signed   By: Abigail Miyamoto M.D.   On: 07/07/2014 18:16    EKG: Independently reviewed. Sinus tachycardia.  Assessment/Plan Active Problems:   CAP (community acquired pneumonia)   Effusion of right knee   Hypertension   Abdominal pain   Pneumonia   1. Community-acquired pneumonia - patient has been placed on ceftriaxone and Zithromax. Check influenza PCR urine for Legionella and strep antigen. Continue with hydration. 2. Abdominal pain - patient states abdominal pain is mostly on eating. At this time concerning for cholecystitis or symptomatic gallstones. I have ordered abdominal sonogram and if it shows gallstones without cholecystitis then may need HIDA scan. 3. Right knee effusion - suspect gout. Check uric acid levels. Consult orthopedics in a.m. for arthrocentesis at this time he does not look septic. 4. Left renal cyst per the CAT scan - follow  sonogram of the abdomen. 5. Hypertension - patient does not recall his home medication at this time I have placed patient on when necessary IV hydralazine. Continue home medication after verifying. 6. 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   DVT Prophylaxis SCDs in anticipation of procedure.  Code Status: Full code.  Family Communication: None.  Disposition Plan: Admit to inpatient.    KAKRAKANDY,ARSHAD N. [facility] Pager [phone].  If 7PM-7AM, please contact night-coverage [email or web address] Password [facility] 07/07/2014, 10:33 PM

## 2014-07-07 NOTE — ED Notes (Signed)
Attempted report 

## 2014-07-08 ENCOUNTER — Observation Stay (HOSPITAL_COMMUNITY): Payer: Medicare Other

## 2014-07-08 DIAGNOSIS — K8020 Calculus of gallbladder without cholecystitis without obstruction: Secondary | ICD-10-CM | POA: Diagnosis not present

## 2014-07-08 DIAGNOSIS — N281 Cyst of kidney, acquired: Secondary | ICD-10-CM | POA: Diagnosis not present

## 2014-07-08 LAB — INFLUENZA PANEL BY PCR (TYPE A & B)
H1N1FLUPCR: NOT DETECTED
INFLBPCR: NEGATIVE
Influenza A By PCR: NEGATIVE

## 2014-07-08 LAB — URIC ACID
Uric Acid, Serum: 8.1 mg/dL — ABNORMAL HIGH (ref 4.0–7.8)
Uric Acid, Serum: 7.5 mg/dL (ref 4.0–7.8)

## 2014-07-08 LAB — LIPASE, BLOOD: LIPASE: 22 U/L (ref 11–59)

## 2014-07-08 LAB — COMPREHENSIVE METABOLIC PANEL
ALK PHOS: 41 U/L (ref 39–117)
ALT: 8 U/L (ref 0–53)
AST: 12 U/L (ref 0–37)
Albumin: 2.7 g/dL — ABNORMAL LOW (ref 3.5–5.2)
Anion gap: 15 (ref 5–15)
BILIRUBIN TOTAL: 0.6 mg/dL (ref 0.3–1.2)
BUN: 5 mg/dL — ABNORMAL LOW (ref 6–23)
CALCIUM: 8.9 mg/dL (ref 8.4–10.5)
CO2: 26 mmol/L (ref 19–32)
Chloride: 100 mEq/L (ref 96–112)
Creatinine, Ser: 0.83 mg/dL (ref 0.50–1.35)
GFR calc Af Amer: >90 mL/min (ref >90)
GLUCOSE: 99 mg/dL (ref 70–99)
POTASSIUM: 3.6 mmol/L (ref 3.5–5.1)
Sodium: 141 mmol/L (ref 135–145)
TOTAL PROTEIN: 7.1 g/dL (ref 6.0–8.3)

## 2014-07-08 LAB — CBC WITH DIFFERENTIAL/PLATELET
BASOS PCT: 0 % (ref 0–1)
Basophils Absolute: 0 10*3/uL (ref 0.0–0.1)
EOS ABS: 0 10*3/uL (ref 0.0–0.7)
EOS PCT: 0 % (ref 0–5)
HEMATOCRIT: 35.6 % — AB (ref 39.0–52.0)
HEMOGLOBIN: 11.1 g/dL — AB (ref 13.0–17.0)
Lymphocytes Relative: 35 % (ref 12–46)
Lymphs Abs: 3.1 10*3/uL (ref 0.7–4.0)
MCH: 23.8 pg — ABNORMAL LOW (ref 26.0–34.0)
MCHC: 31.2 g/dL (ref 30.0–36.0)
MCV: 76.4 fL — AB (ref 78.0–100.0)
MONOS PCT: 11 % (ref 3–12)
Monocytes Absolute: 1 10*3/uL (ref 0.1–1.0)
NEUTROS ABS: 4.8 10*3/uL (ref 1.7–7.7)
NEUTROS PCT: 54 % (ref 43–77)
Platelets: 254 10*3/uL (ref 150–400)
RBC: 4.66 MIL/uL (ref 4.22–5.81)
RDW: 14.9 % (ref 11.5–15.5)
WBC: 9 10*3/uL (ref 4.0–10.5)

## 2014-07-08 LAB — SYNOVIAL CELL COUNT + DIFF, W/ CRYSTALS
EOSINOPHILS-SYNOVIAL: NONE SEEN % (ref 0–1)
LYMPHOCYTES-SYNOVIAL FLD: 2 % (ref 0–20)
MONOCYTE-MACROPHAGE-SYNOVIAL FLUID: 24 % — AB (ref 50–90)
NEUTROPHIL, SYNOVIAL: 74 % — AB (ref 0–25)
WBC, Synovial: 4543 /mm3 — ABNORMAL HIGH (ref 0–200)

## 2014-07-08 LAB — TROPONIN I: Troponin I: <0.03 ng/mL (ref <0.031)

## 2014-07-08 LAB — STREP PNEUMONIAE URINARY ANTIGEN: STREP PNEUMO URINARY ANTIGEN: NEGATIVE

## 2014-07-08 LAB — SEDIMENTATION RATE: Sed Rate: 75 mm/hr — ABNORMAL HIGH (ref 0–16)

## 2014-07-08 LAB — C-REACTIVE PROTEIN: CRP: 10.4 mg/dL — AB (ref <0.60)

## 2014-07-08 MED ORDER — PNEUMOCOCCAL VAC POLYVALENT 25 MCG/0.5ML IJ INJ
0.5000 mL | INJECTION | INTRAMUSCULAR | Status: AC
  Administered 2014-07-09: 0.5 mL via INTRAMUSCULAR
  Filled 2014-07-08: qty 0.5

## 2014-07-08 MED ORDER — WHITE PETROLATUM GEL
Status: AC
  Administered 2014-07-08: 0.2
  Filled 2014-07-08: qty 1

## 2014-07-08 MED ORDER — HYDROCODONE-ACETAMINOPHEN 5-325 MG PO TABS
1.0000 | ORAL_TABLET | Freq: Four times a day (QID) | ORAL | Status: DC | PRN
  Administered 2014-07-08 – 2014-07-09 (×3): 1 via ORAL
  Filled 2014-07-08 (×3): qty 1

## 2014-07-08 NOTE — Consult Note (Addendum)
ORTHOPAEDIC CONSULTATION  REQUESTING PHYSICIAN: Ripudeep Krystal Eaton, MD  Chief Complaint: Right knee pain and swelling  HPI: Roberto Horne is a 54 y.o. male who complains of right knee pain for 1 day.  Currently is being treated for CAP on IV abx.  Denies gout.  Does have diagnosis of OA.  Ortho consulted.  Past Medical History  Diagnosis Date  . Hypertension   . Asthma   . Hyperlipemia   . Gout    Past Surgical History  Procedure Laterality Date  . Brain surgery     History   Social History  . Marital Status: Single    Spouse Name: N/A    Number of Children: N/A  . Years of Education: N/A   Social History Main Topics  . Smoking status: Never Smoker   . Smokeless tobacco: None  . Alcohol Use: No  . Drug Use: No  . Sexual Activity: None   Other Topics Concern  . None   Social History Narrative   History reviewed. No pertinent family history. Allergies  Allergen Reactions  . Ibuprofen Itching   Prior to Admission medications   Medication Sig Start Date End Date Taking? Authorizing Provider  sodium chloride (OCEAN) 0.65 % SOLN nasal spray Place 1 spray into both nostrils 2 (two) times daily.   Yes Historical Provider, MD  acetaminophen (TYLENOL) 500 MG tablet Take 1 tablet (500 mg total) by mouth every 6 (six) hours as needed. Patient not taking: Reported on 06/30/2014 12/25/13   Linus Mako, PA-C  cetirizine (ZYRTEC ALLERGY) 10 MG tablet Take 1 tablet (10 mg total) by mouth daily. Patient not taking: Reported on 06/30/2014 05/18/14   Jarrett Soho Muthersbaugh, PA-C  fluticasone (FLONASE) 50 MCG/ACT nasal spray Place 1 spray into both nostrils daily. Patient not taking: Reported on 05/18/2014 02/05/14   Larene Pickett, PA-C  hydrocortisone 2.5 % lotion Apply topically 2 (two) times daily. Patient not taking: Reported on 06/30/2014 05/18/14   Jarrett Soho Muthersbaugh, PA-C   Dg Chest 2 View  07/07/2014   CLINICAL DATA:  Pain and swelling RIGHT knee down RIGHT leg to foot and  ankle, history asthma, hypertension, hyperlipidemia  EXAM: CHEST  2 VIEW  COMPARISON:  06/30/2014  FINDINGS: Normal heart size, mediastinal contours and pulmonary vascularity.  Mild streaky atelectasis in retrocardiac LEFT lower lobe on lateral view.  Remaining lungs clear.  No pleural effusion or pneumothorax.  Bones unremarkable.  IMPRESSION: Streaky atelectasis LEFT lower lobe.   Electronically Signed   By: Lavonia Dana M.D.   On: 07/07/2014 18:17   Dg Ankle Complete Right  07/07/2014   CLINICAL DATA:  Right foot and ankle pain.  No known injury.  EXAM: RIGHT ANKLE - COMPLETE 3+ VIEW  COMPARISON:  Right ankle 12/03/2009  FINDINGS: No fracture or dislocation. The alignment is maintained. The ankle mortise is preserved. Increased osteophytes at the tibiotalar articulation. Unchanged subtalar degenerative change. Proliferative change noted in the midfoot. No erosion or periosteal reaction. Minimal soft tissue edema.  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 No acute osseous abnormality.   Electronically Signed   By: Jeb Levering M.D.   On: 07/07/2014 18:18   Ct Angio Chest Pe W/cm &/or Wo Cm  07/07/2014   CLINICAL DATA:  Shortness of breath, RIGHT leg pain and swelling, history asthma, hypertension, hyperlipidemia  EXAM: CT ANGIOGRAPHY CHEST WITH CONTRAST  TECHNIQUE: Multidetector CT imaging of the chest was performed using the standard protocol during bolus administration of intravenous contrast.  Multiplanar CT image reconstructions and MIPs were obtained to evaluate the vascular anatomy.  CONTRAST:  42mL OMNIPAQUE IOHEXOL 350 MG/ML SOLN  COMPARISON:  None  FINDINGS: Aorta normal caliber without aneurysm or dissection.  Large intermediate attenuation mass at upper pole LEFT kidney question mildly complicated cyst 5.1 x 5.0 cm image 137.  Patchiness appearance of RIGHT nephrogram.  No thoracic adenopathy.  Respiratory motion artifacts in the lower lobes.  Pulmonary arteries minimally  prominent size centrally.  No definite pulmonary emboli identified within limitations of motion.  Patchy opacity in the posterior RIGHT lower lobe base may represent infiltrate but requiring followup until resolution to exclude underlying nodules.  Mild atelectasis in infiltrate in LEFT lower lobe and lingular base with focal area of infiltrate versus nodularity in LEFT mid lung 8 mm diameter image 59.  No pleural effusion or pneumothorax.  Collapsed hiatal hernia demonstrating slightly thickened wall though this could be an artifact from underdistention.  Bones unremarkable.  Review of the MIP images confirms the above findings.  IMPRESSION: No definite evidence of pulmonary embolism.  Patchy areas of opacity in the lower lobes as well as in the lingula and LEFT mid lung, likely infiltrate the requiring followup CT assessment in 3-4 months to exclude underlying pulmonary nodules.  Patchy appearance of RIGHT nephrogram, potentially related to beam hardening artifacts but unable to exclude pyelonephritis ; correlation with urinalysis recommended.  5.1 x 5.0 cm diameter intermediate attenuation lesion at the LEFT kidney, incompletely visualized, recommend followup sonographic assessment to exclude cystic tumor.   Electronically Signed   By: Lavonia Dana M.D.   On: 07/07/2014 20:42   US Abdomen Complete  07/08/2014   CLINICAL DATA:  Acute onset of generalized abdominal pain. Initial encounter.  EXAM: ULTRASOUND ABDOMEN COMPLETE  COMPARISON:  CT of the abdomen and pelvis from 10/02/2011  FINDINGS: Gallbladder: Numerous stones are seen dependently in the gallbladder, measuring up to 0.5 cm in size. No gallbladder wall thickening or pericholecystic fluid is seen. No ultrasonographic Murphy's sign is elicited.  Common bile duct: Diameter: 0.3 cm, within normal limits in caliber.  Liver: No focal lesion identified. Within normal limits in parenchymal echogenicity.  IVC: No abnormality visualized.  Pancreas: Visualized  portion unremarkable.  Spleen: Size and appearance within normal limits.  Right Kidney: Length: 10.0 cm. Echogenicity within normal limits. No mass or hydronephrosis visualized.  Left Kidney: Length: 11.2 cm. Echogenicity within normal limits. No hydronephrosis visualized. A 5.5 x 4.9 x 4.4 cm cyst is noted at the lower pole of the left kidney.  Abdominal aorta: No aneurysm visualized. Not characterized distally due to overlying bowel gas.  Other findings: None.  IMPRESSION: 1. No acute abnormality seen within the abdomen. 2. Cholelithiasis; gallbladder otherwise unremarkable. 3. 5.5 cm left renal cyst noted.   Electronically Signed   By: Garald Balding M.D.   On: 07/08/2014 03:43   Dg Knee Complete 4 Views Right  07/07/2014   CLINICAL DATA:  Right knee pain and swelling. Pain radiates down leg. No known injury.  EXAM: RIGHT KNEE - COMPLETE 4+ VIEW  COMPARISON:  None.  FINDINGS: No fracture or dislocation. The alignment is maintained. Peripheral osteophytes of the medial greater than latter tibial femoral compartment. No significant joint space narrowing. No osseous erosions. There is a moderately large joint effusion and anterior soft tissue edema.  IMPRESSION: 1. Mild-to-moderate osteoarthritis.  No acute bony abnormality. 2. Moderately large joint effusion, mild anterior soft tissue edema.   Electronically Signed   By:  Jeb Levering M.D.   On: 07/07/2014 18:15   Dg Foot Complete Right  07/07/2014   CLINICAL DATA:  Swelling without trauma.  Pain.  EXAM: RIGHT FOOT COMPLETE - 3+ VIEW  COMPARISON:  12/03/2009  FINDINGS: Mild diffuse soft tissue swelling, especially about the forefoot. Scattered degenerate changes, including about the midfoot anteriorly. No soft tissue gas. No radiopaque foreign object. No osseous destruction.  IMPRESSION: Soft tissue swelling, without acute finding.   Electronically Signed   By: Abigail Miyamoto M.D.   On: 07/07/2014 18:16    Positive ROS: All other systems have been  reviewed and were otherwise negative with the exception of those mentioned in the HPI and as above.  Physical Exam: General: Alert, no acute distress Cardiovascular: No pedal edema Respiratory: No cyanosis, no use of accessory musculature GI: No organomegaly, abdomen is soft and non-tender Skin: No lesions in the area of chief complaint Neurologic: Sensation intact distally Psychiatric: Patient is competent for consent with normal mood and affect Lymphatic: No axillary or cervical lymphadenopathy  MUSCULOSKELETAL:  - moderate effusion of right knee - no cellulitis - mild pain with knee ROM  Assessment: Right knee effusion, OA flare up  Plan: - knee aspirated with 60 cc of synovial fluid c/w OA - will send to lab for analysis - likelihood of infection is low - steroid injection not recommended while inpatient and with active infection - could consider steroid injection as an outpatient    Thank you for the consult and the opportunity to see Mr. Roberto Horne. Eduard Roux, MD [place] 12:53 PM    Cell count is c/w gout.  Recommend medical treatment of gout while inpatient.    Azucena Cecil, MD [place] 3:57 PM

## 2014-07-08 NOTE — Progress Notes (Signed)
Patient ID: Roberto Horne  male  JHE:[id number]    DOB: 08-Jul-1960    DOA: 07/07/2014  PCP: Arnoldo Morale, MD  Brief history of present illness  Patient is a 54 year old male with hypertension, asthma, hyperlipidemia, gout presented to ED with worsening right knee swelling over the last 2 days. Patient also reported feeling short of breath over the last 1 week, mostly exertional, nonproductive cough. He also reports whenever he eats, has epigastric discomfort. In ED issues d-dimer was mildly elevated, CT chest was negative for PE however did show left-sided pneumonia.   Assessment/Plan: Principal Problem:   CAP (community acquired pneumonia) - Continue IV Rocephin and Zithromax,  - influenza panel negative, urine strep antigen negative, follow urine legionella antigen, continue IV fluid hydration  Active Problems:   Effusion of right knee, right knee pain - X-ray showed moderately large joint effusion with mild anterior soft tissue edema, mild to moderate osteoarthritis - Probably needs tap and studies, ordered ESR, CRP, uric acid, discussed with Dr Erlinda Hong with orthopedics. At this time does not look septic.Marland Kitchen       Hypertension - Currently stable    Abdominal pain - Ultrasound abdomen showed no acute abnormality, cholelithiasis, gallbladder otherwise unremarkable HIDA scan was ordered   dysarthria  - Not new per patient he had previous brain injury    DVT Prophylaxis: SCD's  Code Status: FC  Family Communication:  Disposition:  Consultants: Orthopedics,Dr Erlinda Hong  Procedures:  none  Antibiotics:  IV Zithromax    Rocephin   Subjective: Patient seen and examined, denies any specific complaints except right knee pain, no coughing or fevers or chills this morning  ive: Weight change:   Intake/Output Summary (Last 24 hours) at 07/08/14 1142 Last data filed at 07/08/14 0900  Gross per 24 hour  Intake   1440 ml  Output    250 ml  Net   1190 ml   Blood pressure 92/53,  pulse 95, temperature 98.2 F (36.8 C), temperature source Oral, resp. rate 18, height 6' 2.5" (1.892 m), weight 116.937 kg (257 lb 12.8 oz), SpO2 98 %.  Physical Exam: General: Alert and awake, oriented x3, not in any acute distress. CVS: S1-S2 clear, no murmur rubs or gallops Chest: clear to auscultation bilaterally, no wheezing, rales or rhonchi Abdomen: soft nontender, nondistended, normal bowel sounds  Extremities: no cyanosis, clubbing or edema noted bilaterally, right knee effusion + Neuro: Cranial nerves II-XII intact, no focal neurological deficits  Lab Results: Basic Metabolic Panel:  Recent Labs Lab 07/07/14 1659 07/08/14 0550  NA 139 141  K 5.2* 3.6  CL 98 100  CO2 27 26  GLUCOSE 92 99  BUN 6 5*  CREATININE 0.94 0.83  CALCIUM 9.6 8.9   Liver Function Tests:  Recent Labs Lab 07/07/14 1659 07/08/14 0550  AST 26 12  ALT 8 8  ALKPHOS 48 41  BILITOT 2.1* 0.6  PROT 8.4* 7.1  ALBUMIN 3.1* 2.7*    Recent Labs Lab 07/08/14 0550  LIPASE 22   No results for input(s): AMMONIA in the last 168 hours. CBC:  Recent Labs Lab 07/07/14 1659 07/08/14 0550  WBC 11.4* 9.0  NEUTROABS 8.2* 4.8  HGB 12.6* 11.1*  HCT 38.8* 35.6*  MCV 75.0* 76.4*  PLT 288 254   Cardiac Enzymes:  Recent Labs Lab 07/07/14 1659 07/07/14 2320 07/08/14 0550  TROPONINI <0.03 <0.03 <0.03   BNP: Invalid input(s): POCBNP CBG: No results for input(s): GLUCAP in the last 168 hours.   Micro  Results: No results found for this or any previous visit (from the past 240 hour(s)).  Studies/Results: Dg Chest 2 View  07/07/2014   CLINICAL DATA:  Pain and swelling RIGHT knee down RIGHT leg to foot and ankle, history asthma, hypertension, hyperlipidemia  EXAM: CHEST  2 VIEW  COMPARISON:  06/30/2014  FINDINGS: Normal heart size, mediastinal contours and pulmonary vascularity.  Mild streaky atelectasis in retrocardiac LEFT lower lobe on lateral view.  Remaining lungs clear.  No pleural  effusion or pneumothorax.  Bones unremarkable.  IMPRESSION: Streaky atelectasis LEFT lower lobe.   Electronically Signed   By: Lavonia Dana M.D.   On: 07/07/2014 18:17   Dg Chest 2 View  06/30/2014   CLINICAL DATA:  Cough and congestion. Shortness of breath since last night.  EXAM: CHEST  2 VIEW  COMPARISON:  12/25/2013  FINDINGS: Cough. Minimal motion degraded lateral view. Midline trachea. Mild cardiomegaly. No pleural effusion or pneumothorax. Mild scarring at the left lung base. No lobar consolidation.  IMPRESSION: No acute cardiopulmonary disease.   Electronically Signed   By: Abigail Miyamoto M.D.   On: 06/30/2014 10:20   Dg Ankle Complete Right  07/07/2014   CLINICAL DATA:  Right foot and ankle pain.  No known injury.  EXAM: RIGHT ANKLE - COMPLETE 3+ VIEW  COMPARISON:  Right ankle 12/03/2009  FINDINGS: No fracture or dislocation. The alignment is maintained. The ankle mortise is preserved. Increased osteophytes at the tibiotalar articulation. Unchanged subtalar degenerative change. Proliferative change noted in the midfoot. No erosion or periosteal reaction. Minimal soft tissue edema.  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 No acute osseous abnormality.   Electronically Signed   By: Jeb Levering M.D.   On: 07/07/2014 18:18   Ct Angio Chest Pe W/cm &/or Wo Cm  07/07/2014   CLINICAL DATA:  Shortness of breath, RIGHT leg pain and swelling, history asthma, hypertension, hyperlipidemia  EXAM: CT ANGIOGRAPHY CHEST WITH CONTRAST  TECHNIQUE: Multidetector CT imaging of the chest was performed using the standard protocol during bolus administration of intravenous contrast. Multiplanar CT image reconstructions and MIPs were obtained to evaluate the vascular anatomy.  CONTRAST:  51m OMNIPAQUE IOHEXOL 350 MG/ML SOLN  COMPARISON:  None  FINDINGS: Aorta normal caliber without aneurysm or dissection.  Large intermediate attenuation mass at upper pole LEFT kidney question mildly complicated cyst  5.1 x 5.0 cm image 137.  Patchiness appearance of RIGHT nephrogram.  No thoracic adenopathy.  Respiratory motion artifacts in the lower lobes.  Pulmonary arteries minimally prominent size centrally.  No definite pulmonary emboli identified within limitations of motion.  Patchy opacity in the posterior RIGHT lower lobe base may represent infiltrate but requiring followup until resolution to exclude underlying nodules.  Mild atelectasis in infiltrate in LEFT lower lobe and lingular base with focal area of infiltrate versus nodularity in LEFT mid lung 8 mm diameter image 59.  No pleural effusion or pneumothorax.  Collapsed hiatal hernia demonstrating slightly thickened wall though this could be an artifact from underdistention.  Bones unremarkable.  Review of the MIP images confirms the above findings.  IMPRESSION: No definite evidence of pulmonary embolism.  Patchy areas of opacity in the lower lobes as well as in the lingula and LEFT mid lung, likely infiltrate the requiring followup CT assessment in 3-4 months to exclude underlying pulmonary nodules.  Patchy appearance of RIGHT nephrogram, potentially related to beam hardening artifacts but unable to exclude pyelonephritis ; correlation with urinalysis recommended.  5.1 x 5.0 cm diameter  intermediate attenuation lesion at the LEFT kidney, incompletely visualized, recommend followup sonographic assessment to exclude cystic tumor.   Electronically Signed   By: Lavonia Dana M.D.   On: 07/07/2014 20:42   US Abdomen Complete  07/08/2014   CLINICAL DATA:  Acute onset of generalized abdominal pain. Initial encounter.  EXAM: ULTRASOUND ABDOMEN COMPLETE  COMPARISON:  CT of the abdomen and pelvis from 10/02/2011  FINDINGS: Gallbladder: Numerous stones are seen dependently in the gallbladder, measuring up to 0.5 cm in size. No gallbladder wall thickening or pericholecystic fluid is seen. No ultrasonographic Murphy's sign is elicited.  Common bile duct: Diameter: 0.3 cm,  within normal limits in caliber.  Liver: No focal lesion identified. Within normal limits in parenchymal echogenicity.  IVC: No abnormality visualized.  Pancreas: Visualized portion unremarkable.  Spleen: Size and appearance within normal limits.  Right Kidney: Length: 10.0 cm. Echogenicity within normal limits. No mass or hydronephrosis visualized.  Left Kidney: Length: 11.2 cm. Echogenicity within normal limits. No hydronephrosis visualized. A 5.5 x 4.9 x 4.4 cm cyst is noted at the lower pole of the left kidney.  Abdominal aorta: No aneurysm visualized. Not characterized distally due to overlying bowel gas.  Other findings: None.  IMPRESSION: 1. No acute abnormality seen within the abdomen. 2. Cholelithiasis; gallbladder otherwise unremarkable. 3. 5.5 cm left renal cyst noted.   Electronically Signed   By: Garald Balding M.D.   On: 07/08/2014 03:43   Dg Knee Complete 4 Views Right  07/07/2014   CLINICAL DATA:  Right knee pain and swelling. Pain radiates down leg. No known injury.  EXAM: RIGHT KNEE - COMPLETE 4+ VIEW  COMPARISON:  None.  FINDINGS: No fracture or dislocation. The alignment is maintained. Peripheral osteophytes of the medial greater than latter tibial femoral compartment. No significant joint space narrowing. No osseous erosions. There is a moderately large joint effusion and anterior soft tissue edema.  IMPRESSION: 1. Mild-to-moderate osteoarthritis.  No acute bony abnormality. 2. Moderately large joint effusion, mild anterior soft tissue edema.   Electronically Signed   By: Jeb Levering M.D.   On: 07/07/2014 18:15   Dg Foot Complete Right  07/07/2014   CLINICAL DATA:  Swelling without trauma.  Pain.  EXAM: RIGHT FOOT COMPLETE - 3+ VIEW  COMPARISON:  12/03/2009  FINDINGS: Mild diffuse soft tissue swelling, especially about the forefoot. Scattered degenerate changes, including about the midfoot anteriorly. No soft tissue gas. No radiopaque foreign object. No osseous destruction.   IMPRESSION: Soft tissue swelling, without acute finding.   Electronically Signed   By: Abigail Miyamoto M.D.   On: 07/07/2014 18:16    Medications: Scheduled Meds: . [START ON 05/09/2015] azithromycin  500 mg Intravenous Q24H  . [START ON 05/09/2015] cefTRIAXone (ROCEPHIN)  IV  1 g Intravenous QHS  . Influenza vac split quadrivalent PF  0.5 mL Intramuscular Tomorrow-1000  . [START ON 07/09/2014] pneumococcal 23 valent vaccine  0.5 mL Intramuscular Tomorrow-1000      LOS: 1 day   RAI,RIPUDEEP M.D. [facility] 07/08/2014, 11:42 AM Pager: [phone]  If 7PM-7AM, please contact night-coverage [email or web address] Password [email or web address]

## 2014-07-09 ENCOUNTER — Inpatient Hospital Stay (HOSPITAL_COMMUNITY): Payer: Medicare Other

## 2014-07-09 DIAGNOSIS — J189 Pneumonia, unspecified organism: Secondary | ICD-10-CM | POA: Diagnosis not present

## 2014-07-09 LAB — CBC
HCT: 34.7 % — ABNORMAL LOW (ref 39.0–52.0)
HEMOGLOBIN: 10.9 g/dL — AB (ref 13.0–17.0)
MCH: 23.6 pg — AB (ref 26.0–34.0)
MCHC: 31.4 g/dL (ref 30.0–36.0)
MCV: 75.3 fL — AB (ref 78.0–100.0)
PLATELETS: 258 10*3/uL (ref 150–400)
RBC: 4.61 MIL/uL (ref 4.22–5.81)
RDW: 15 % (ref 11.5–15.5)
WBC: 9.6 10*3/uL (ref 4.0–10.5)

## 2014-07-09 LAB — HIV ANTIBODY (ROUTINE TESTING W REFLEX)
HIV 1/HIV 2 AB: NONREACTIVE
HIV 1/O/2 Abs-Index Value: <1 (ref <1.00)

## 2014-07-09 LAB — BASIC METABOLIC PANEL
Anion gap: 9 (ref 5–15)
BUN: 5 mg/dL — AB (ref 6–23)
CO2: 25 mmol/L (ref 19–32)
Calcium: 8.5 mg/dL (ref 8.4–10.5)
Chloride: 102 mEq/L (ref 96–112)
Creatinine, Ser: 0.65 mg/dL (ref 0.50–1.35)
GFR calc non Af Amer: >90 mL/min (ref >90)
Glucose, Bld: 96 mg/dL (ref 70–99)
Potassium: 3.6 mmol/L (ref 3.5–5.1)
SODIUM: 136 mmol/L (ref 135–145)

## 2014-07-09 LAB — LEGIONELLA ANTIGEN, URINE

## 2014-07-09 LAB — EXPECTORATED SPUTUM ASSESSMENT W GRAM STAIN, RFLX TO RESP C

## 2014-07-09 LAB — EXPECTORATED SPUTUM ASSESSMENT W REFEX TO RESP CULTURE

## 2014-07-09 MED ORDER — NAPROXEN 375 MG PO TABS
375.0000 mg | ORAL_TABLET | Freq: Two times a day (BID) | ORAL | Status: DC
  Administered 2014-07-09 – 2014-07-12 (×8): 375 mg via ORAL
  Filled 2014-07-09 (×9): qty 1

## 2014-07-09 MED ORDER — COLCHICINE 0.6 MG PO TABS
0.6000 mg | ORAL_TABLET | Freq: Every day | ORAL | Status: DC
  Administered 2014-07-10 – 2014-07-12 (×3): 0.6 mg via ORAL
  Filled 2014-07-09 (×3): qty 1

## 2014-07-09 MED ORDER — MORPHINE SULFATE 4 MG/ML IJ SOLN
4.8000 mg | Freq: Once | INTRAMUSCULAR | Status: AC
  Administered 2014-07-09: 4.8 mg via INTRAVENOUS

## 2014-07-09 MED ORDER — TECHNETIUM TC 99M MEBROFENIN IV KIT
5.0000 | PACK | Freq: Once | INTRAVENOUS | Status: AC | PRN
  Administered 2014-07-09: 5 via INTRAVENOUS

## 2014-07-09 MED ORDER — COLCHICINE 0.6 MG PO TABS
0.6000 mg | ORAL_TABLET | Freq: Two times a day (BID) | ORAL | Status: AC
  Administered 2014-07-09 (×2): 0.6 mg via ORAL
  Filled 2014-07-09 (×2): qty 1

## 2014-07-09 MED ORDER — MORPHINE SULFATE 4 MG/ML IJ SOLN
INTRAMUSCULAR | Status: AC
  Administered 2014-07-09: 4.8 mg via INTRAVENOUS
  Filled 2014-07-09: qty 2

## 2014-07-09 MED ORDER — DEXTROSE 5 % IV SOLN
500.0000 mg | INTRAVENOUS | Status: DC
  Administered 2014-07-09 – 2014-07-10 (×2): 500 mg via INTRAVENOUS
  Filled 2014-07-09 (×2): qty 500

## 2014-07-09 MED ORDER — CEFTRIAXONE SODIUM IN DEXTROSE 20 MG/ML IV SOLN
1.0000 g | INTRAVENOUS | Status: DC
  Administered 2014-07-09 – 2014-07-11 (×3): 1 g via INTRAVENOUS
  Filled 2014-07-09 (×3): qty 50

## 2014-07-09 NOTE — Progress Notes (Addendum)
Patient ID: Roberto Horne  male  WNI:[id number]    DOB: 1960/09/28    DOA: 07/07/2014  PCP: Arnoldo Morale, MD  Brief history of present illness  Patient is a 54 year old male with hypertension, asthma, hyperlipidemia, gout presented to ED with worsening right knee swelling over the last 2 days. Patient also reported feeling short of breath over the last 1 week, mostly exertional, nonproductive cough. He also reports whenever he eats, has epigastric discomfort. In ED issues d-dimer was mildly elevated, CT chest was negative for PE however did show left-sided pneumonia.   Assessment/Plan: Principal Problem:   CAP (community acquired pneumonia) - Continue IV Rocephin and Zithromax,  - influenza panel negative, urine strep antigen negative, urine legionella antigen negative, continue IV fluid hydration  Active Problems:   Effusion of right knee, right knee pain - X-ray showed moderately large joint effusion with mild anterior soft tissue edema, mild to moderate osteoarthritis - Appreciate orthopedics, Dr Erlinda Hong, arthrocentesis done yesterday, shows a monosodium urate crystals - Placed on colchicine, Naprosyn     Hypertension - Currently stable    Abdominal pain - Ultrasound abdomen showed no acute abnormality, cholelithiasis, gallbladder otherwise unremarkable - HIDA scan done, patent cystic duct.   dysarthria  - Not new per patient he had previous brain injury    DVT Prophylaxis: SCD's  Code Status: FC  Family Communication:  Disposition: hopefully tomorrow   Consultants: Orthopedics,Dr Erlinda Hong  Procedures:  none  Antibiotics:  IV Zithromax    Rocephin   Subjective: Patient seen and examined, feeling better today, denies any specific complaints, no fevers or chills, no coughing  Objective  Weight change: 3.963 kg (8 lb 11.8 oz)  Intake/Output Summary (Last 24 hours) at 07/09/14 1226 Last data filed at 07/09/14 0532  Gross per 24 hour  Intake    720 ml  Output     700 ml  Net     20 ml   Blood pressure 112/64, pulse 86, temperature 98.4 F (36.9 C), temperature source Oral, resp. rate 17, height 6' 2.5" (1.892 m), weight 120.9 kg (266 lb 8.6 oz), SpO2 100 %.  Physical Exam: General: Alert and awake, oriented x3, not in any acute distress. CVS: S1-S2 clear, no murmur rubs or gallops ChestDecreased breath sounds at the bases otherwise clear Abd: soft nontender, nondistended, normal bowel sounds  Extremities: no cyanosis, clubbing or edema noted bilaterally, right knee effusion +   Lab Results: Basic Metabolic Panel:  Recent Labs Lab 07/08/14 0550 07/09/14 0601  NA 141 136  K 3.6 3.6  CL 100 102  CO2 26 25  GLUCOSE 99 96  BUN 5* 5*  CREATININE 0.83 0.65  CALCIUM 8.9 8.5   Liver Function Tests:  Recent Labs Lab 07/07/14 1659 07/08/14 0550  AST 26 12  ALT 8 8  ALKPHOS 48 41  BILITOT 2.1* 0.6  PROT 8.4* 7.1  ALBUMIN 3.1* 2.7*    Recent Labs Lab 07/08/14 0550  LIPASE 22   No results for input(s): AMMONIA in the last 168 hours. CBC:  Recent Labs Lab 07/08/14 0550 07/09/14 0601  WBC 9.0 9.6  NEUTROABS 4.8  --   HGB 11.1* 10.9*  HCT 35.6* 34.7*  MCV 76.4* 75.3*  PLT 254 258   Cardiac Enzymes:  Recent Labs Lab 07/07/14 2320 07/08/14 0550 07/08/14 1130  TROPONINI <0.03 <0.03 <0.03   BNP: Invalid input(s): POCBNP CBG: No results for input(s): GLUCAP in the last 168 hours.   Micro Results: Recent Results (from  the past 240 hour(s))  Culture, blood (routine x 2)     Status: None (Preliminary result)   Collection Time: 07/07/14 10:15 PM  Result Value Ref Range Status   Specimen Description BLOOD ARM RIGHT  Final   Special Requests BOTTLES DRAWN AEROBIC AND ANAEROBIC 10CC  Final   Culture   Final           BLOOD CULTURE RECEIVED NO GROWTH TO DATE CULTURE WILL BE HELD FOR 5 DAYS BEFORE ISSUING A FINAL NEGATIVE REPORT Performed at [facility]    Report Status PENDING  Incomplete  Culture, blood  (routine x 2)     Status: None (Preliminary result)   Collection Time: 07/07/14 10:20 PM  Result Value Ref Range Status   Specimen Description BLOOD HAND RIGHT  Final   Special Requests BOTTLES DRAWN AEROBIC AND ANAEROBIC 10CC  Final   Culture   Final           BLOOD CULTURE RECEIVED NO GROWTH TO DATE CULTURE WILL BE HELD FOR 5 DAYS BEFORE ISSUING A FINAL NEGATIVE REPORT Performed at [facility]    Report Status PENDING  Incomplete  Body fluid culture     Status: None (Preliminary result)   Collection Time: 07/08/14  1:02 PM  Result Value Ref Range Status   Specimen Description FLUID SYNOVIAL KNEE RIGHT  Final   Special Requests NONE  Final   Gram Stain   Final    FEW WBC PRESENT, PREDOMINANTLY PMN NO ORGANISMS SEEN Performed at [facility]    Culture   Final    NO GROWTH 1 DAY Performed at [facility]    Report Status PENDING  Incomplete    Studies/Results: Dg Chest 2 View  07/07/2014   CLINICAL DATA:  Pain and swelling RIGHT knee down RIGHT leg to foot and ankle, history asthma, hypertension, hyperlipidemia  EXAM: CHEST  2 VIEW  COMPARISON:  06/30/2014  FINDINGS: Normal heart size, mediastinal contours and pulmonary vascularity.  Mild streaky atelectasis in retrocardiac LEFT lower lobe on lateral view.  Remaining lungs clear.  No pleural effusion or pneumothorax.  Bones unremarkable.  IMPRESSION: Streaky atelectasis LEFT lower lobe.   Electronically Signed   By: Lavonia Dana M.D.   On: 07/07/2014 18:17   Dg Chest 2 View  06/30/2014   CLINICAL DATA:  Cough and congestion. Shortness of breath since last night.  EXAM: CHEST  2 VIEW  COMPARISON:  12/25/2013  FINDINGS: Cough. Minimal motion degraded lateral view. Midline trachea. Mild cardiomegaly. No pleural effusion or pneumothorax. Mild scarring at the left lung base. No lobar consolidation.  IMPRESSION: No acute cardiopulmonary disease.   Electronically Signed   By: Abigail Miyamoto M.D.   On: 06/30/2014 10:20    Dg Ankle Complete Right  07/07/2014   CLINICAL DATA:  Right foot and ankle pain.  No known injury.  EXAM: RIGHT ANKLE - COMPLETE 3+ VIEW  COMPARISON:  Right ankle 12/03/2009  FINDINGS: No fracture or dislocation. The alignment is maintained. The ankle mortise is preserved. Increased osteophytes at the tibiotalar articulation. Unchanged subtalar degenerative change. Proliferative change noted in the midfoot. No erosion or periosteal reaction. Minimal soft tissue edema.  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 No acute osseous abnormality.   Electronically Signed   By: Jeb Levering M.D.   On: 07/07/2014 18:18   Ct Angio Chest Pe W/cm &/or Wo Cm  07/07/2014   CLINICAL DATA:  Shortness of breath, RIGHT leg pain  and swelling, history asthma, hypertension, hyperlipidemia  EXAM: CT ANGIOGRAPHY CHEST WITH CONTRAST  TECHNIQUE: Multidetector CT imaging of the chest was performed using the standard protocol during bolus administration of intravenous contrast. Multiplanar CT image reconstructions and MIPs were obtained to evaluate the vascular anatomy.  CONTRAST:  57mL OMNIPAQUE IOHEXOL 350 MG/ML SOLN  COMPARISON:  None  FINDINGS: Aorta normal caliber without aneurysm or dissection.  Large intermediate attenuation mass at upper pole LEFT kidney question mildly complicated cyst 5.1 x 5.0 cm image 137.  Patchiness appearance of RIGHT nephrogram.  No thoracic adenopathy.  Respiratory motion artifacts in the lower lobes.  Pulmonary arteries minimally prominent size centrally.  No definite pulmonary emboli identified within limitations of motion.  Patchy opacity in the posterior RIGHT lower lobe base may represent infiltrate but requiring followup until resolution to exclude underlying nodules.  Mild atelectasis in infiltrate in LEFT lower lobe and lingular base with focal area of infiltrate versus nodularity in LEFT mid lung 8 mm diameter image 59.  No pleural effusion or pneumothorax.  Collapsed  hiatal hernia demonstrating slightly thickened wall though this could be an artifact from underdistention.  Bones unremarkable.  Review of the MIP images confirms the above findings.  IMPRESSION: No definite evidence of pulmonary embolism.  Patchy areas of opacity in the lower lobes as well as in the lingula and LEFT mid lung, likely infiltrate the requiring followup CT assessment in 3-4 months to exclude underlying pulmonary nodules.  Patchy appearance of RIGHT nephrogram, potentially related to beam hardening artifacts but unable to exclude pyelonephritis ; correlation with urinalysis recommended.  5.1 x 5.0 cm diameter intermediate attenuation lesion at the LEFT kidney, incompletely visualized, recommend followup sonographic assessment to exclude cystic tumor.   Electronically Signed   By: Lavonia Dana M.D.   On: 07/07/2014 20:42   Nm Hepatobiliary Liver Func  07/09/2014   CLINICAL DATA:  Abdominal pain.  EXAM: NUCLEAR MEDICINE HEPATOBILIARY IMAGING  TECHNIQUE: Sequential images of the abdomen were obtained out to 60 minutes following intravenous administration of radiopharmaceutical.  RADIOPHARMACEUTICALS:  5.0 Millicurie LK-44W Choletec  COMPARISON:  Ultrasound 07/08/2014.  FINDINGS: Liver, biliary system, and bowel appear normally. Gallbladder not visualized at 1 hr. 4.8 mg of morphine administered. This resulted in visualization the gallbladder within 10 min.  IMPRESSION: Delayed visualization gallbladder. Gallbladder was visualized following administration of morphine. This indicates cystic duct patency.   Electronically Signed   By: Marcello Moores  Register   On: 07/09/2014 10:28   US Abdomen Complete  07/08/2014   CLINICAL DATA:  Acute onset of generalized abdominal pain. Initial encounter.  EXAM: ULTRASOUND ABDOMEN COMPLETE  COMPARISON:  CT of the abdomen and pelvis from 10/02/2011  FINDINGS: Gallbladder: Numerous stones are seen dependently in the gallbladder, measuring up to 0.5 cm in size. No gallbladder  wall thickening or pericholecystic fluid is seen. No ultrasonographic Murphy's sign is elicited.  Common bile duct: Diameter: 0.3 cm, within normal limits in caliber.  Liver: No focal lesion identified. Within normal limits in parenchymal echogenicity.  IVC: No abnormality visualized.  Pancreas: Visualized portion unremarkable.  Spleen: Size and appearance within normal limits.  Right Kidney: Length: 10.0 cm. Echogenicity within normal limits. No mass or hydronephrosis visualized.  Left Kidney: Length: 11.2 cm. Echogenicity within normal limits. No hydronephrosis visualized. A 5.5 x 4.9 x 4.4 cm cyst is noted at the lower pole of the left kidney.  Abdominal aorta: No aneurysm visualized. Not characterized distally due to overlying bowel gas.  Other findings: None.  IMPRESSION: 1. No acute abnormality seen within the abdomen. 2. Cholelithiasis; gallbladder otherwise unremarkable. 3. 5.5 cm left renal cyst noted.   Electronically Signed   By: Garald Balding M.D.   On: 07/08/2014 03:43   Dg Knee Complete 4 Views Right  07/07/2014   CLINICAL DATA:  Right knee pain and swelling. Pain radiates down leg. No known injury.  EXAM: RIGHT KNEE - COMPLETE 4+ VIEW  COMPARISON:  None.  FINDINGS: No fracture or dislocation. The alignment is maintained. Peripheral osteophytes of the medial greater than latter tibial femoral compartment. No significant joint space narrowing. No osseous erosions. There is a moderately large joint effusion and anterior soft tissue edema.  IMPRESSION: 1. Mild-to-moderate osteoarthritis.  No acute bony abnormality. 2. Moderately large joint effusion, mild anterior soft tissue edema.   Electronically Signed   By: Jeb Levering M.D.   On: 07/07/2014 18:15   Dg Foot Complete Right  07/07/2014   CLINICAL DATA:  Swelling without trauma.  Pain.  EXAM: RIGHT FOOT COMPLETE - 3+ VIEW  COMPARISON:  12/03/2009  FINDINGS: Mild diffuse soft tissue swelling, especially about the forefoot. Scattered degenerate  changes, including about the midfoot anteriorly. No soft tissue gas. No radiopaque foreign object. No osseous destruction.  IMPRESSION: Soft tissue swelling, without acute finding.   Electronically Signed   By: Abigail Miyamoto M.D.   On: 07/07/2014 18:16    Medications: Scheduled Meds: . [START ON 05/09/2015] azithromycin  500 mg Intravenous Q24H  . [START ON 05/09/2015] cefTRIAXone (ROCEPHIN)  IV  1 g Intravenous QHS  . colchicine  0.6 mg Oral BID  . [START ON 07/10/2014] colchicine  0.6 mg Oral Daily  . naproxen  375 mg Oral BID WC      LOS: 2 days   Jianni Batten M.D. [facility] 07/09/2014, 12:26 PM Pager: [phone]  If 7PM-7AM, please contact night-coverage [email or web address] Password [email or web address]

## 2014-07-09 NOTE — Progress Notes (Signed)
UR Completed.  [phone]  

## 2014-07-10 MED ORDER — FLUTICASONE PROPIONATE 50 MCG/ACT NA SUSP: 1.0000 | Freq: Every day | NASAL | Status: DC

## 2014-07-10 MED ORDER — HYDROCODONE-ACETAMINOPHEN 5-325 MG PO TABS
1.0000 | ORAL_TABLET | Freq: Four times a day (QID) | ORAL | Status: DC | PRN
  Administered 2014-07-10: 2 via ORAL
  Filled 2014-07-10: qty 2

## 2014-07-10 MED ORDER — PREDNISONE 20 MG PO TABS
40.0000 mg | ORAL_TABLET | Freq: Every day | ORAL | Status: AC
  Administered 2014-07-10 – 2014-07-12 (×3): 40 mg via ORAL
  Filled 2014-07-10 (×3): qty 2

## 2014-07-10 MED ORDER — HYDROCODONE-ACETAMINOPHEN 5-325 MG PO TABS: 1.0000 | ORAL_TABLET | Freq: Four times a day (QID) | ORAL | Status: DC | PRN

## 2014-07-10 MED ORDER — COLCHICINE 0.6 MG PO TABS: 0.6000 mg | ORAL_TABLET | Freq: Every day | ORAL | Status: DC

## 2014-07-10 MED ORDER — LEVOFLOXACIN 750 MG PO TABS: 750.0000 mg | ORAL_TABLET | Freq: Every day | ORAL | Status: DC

## 2014-07-10 MED ORDER — CETIRIZINE HCL 10 MG PO TABS: 10.0000 mg | ORAL_TABLET | Freq: Every day | ORAL | Status: DC

## 2014-07-10 MED ORDER — NAPROXEN 375 MG PO TABS: 375.0000 mg | ORAL_TABLET | Freq: Two times a day (BID) | ORAL | Status: DC

## 2014-07-10 MED ORDER — PREDNISONE 10 MG PO TABS: ORAL_TABLET | ORAL | Status: DC

## 2014-07-10 NOTE — Evaluation (Signed)
Physical Therapy Evaluation Patient Details Name: Roberto Horne MRN: [id number] DOB: [birth year] Today's Date: 07/10/2014   History of Present Illness  Roberto Horne is a 54 y.o. male with history of hypertension and possible gout presents to the ER because of worsening right knee swelling s/p aspiration pt also with PNA and chronic O2 at home  Clinical Impression  Pt very pleasant and moving well. Pt with increased difficulty standing from chair and required RW for gait but able to complete all with min assist. Pt states he has plenty of family assist at home. Pt will benefit from acute therapy to maximize mobility, function, strength and ROM to decrease burden of care and fall risk. Pt educated for long arc quads and encouraged to continue throughout the day.     Follow Up Recommendations Home health PT;Supervision for mobility/OOB    Equipment Recommendations  Rolling walker with 5" wheels;3in1 (PT) (pt states he has RW and BSC but no family present to confirm)    Recommendations for Other Services       Precautions / Restrictions Precautions Precautions: Fall      Mobility  Bed Mobility               General bed mobility comments: pt in chair on arrival  Transfers Overall transfer level: Needs assistance   Transfers: Sit to/from Stand Sit to Stand: Min assist         General transfer comment: increased time with cues for hand placement, sequence and safety with standing  Ambulation/Gait Ambulation/Gait assistance: Min guard Ambulation Distance (Feet): 90 Feet Assistive device: Rolling walker (2 wheeled) Gait Pattern/deviations: Step-through pattern;Decreased stride length     General Gait Details: cues for posture and position in RW  Stairs            Wheelchair Mobility    Modified Rankin (Stroke Patients Only)       Balance Overall balance assessment: Needs assistance   Sitting balance-Leahy Scale: Good       Standing balance-Leahy  Scale: Poor                               Pertinent Vitals/Pain Pain Assessment: 0-10 Pain Score: 4  Pain Location: right knee Pain Descriptors / Indicators: Aching Pain Intervention(s): Repositioned    Home Living Family/patient expects to be discharged to:: Private residence Living Arrangements: Other relatives;Parent Available Help at Discharge: Family;Available 24 hours/day Type of Home: House Home Access: Level entry     Home Layout: One level Home Equipment: Walker - 2 wheels;Bedside commode      Prior Function Level of Independence: Independent         Comments: pt states he normally walks to the park and plays soccer and basketball with friends     Hand Dominance        Extremity/Trunk Assessment               Lower Extremity Assessment: Generalized weakness;RLE deficits/detail RLE Deficits / Details: knee flexion and extension 3/5 limited by pain    Cervical / Trunk Assessment: Normal  Communication   Communication: No difficulties  Cognition Arousal/Alertness: Awake/alert Behavior During Therapy: WFL for tasks assessed/performed Overall Cognitive Status: History of cognitive impairments - at baseline                      General Comments      Exercises General Exercises - Lower Extremity Short  Arc Quad: AROM;Seated;Right;5 reps      Assessment/Plan    PT Assessment Patient needs continued PT services  PT Diagnosis Difficulty walking;Generalized weakness   PT Problem List Decreased strength;Decreased range of motion;Decreased activity tolerance;Decreased balance;Pain;Decreased mobility;Decreased knowledge of use of DME  PT Treatment Interventions Gait training;Functional mobility training;Therapeutic activities;Therapeutic exercise;DME instruction;Patient/family education   PT Goals (Current goals can be found in the Care Plan section) Acute Rehab PT Goals Patient Stated Goal: play ball PT Goal Formulation: With  patient Time For Goal Achievement: 07/24/14 Potential to Achieve Goals: Good    Frequency Min 3X/week   Barriers to discharge        Co-evaluation               End of Session Equipment Utilized During Treatment: Oxygen;Gait belt Activity Tolerance: Patient tolerated treatment well Patient left: in chair;with call bell/phone within reach Nurse Communication: Mobility status         Time: 6047-9987 PT Time Calculation (min) (ACUTE ONLY): 14 min   Charges:   PT Evaluation $Initial PT Evaluation Tier I: 1 Procedure PT Treatments $Gait Training: 8-22 mins   PT G CodesMelford Aase 07/10/2014, 3:42 PM Elwyn Reach, [place]

## 2014-07-10 NOTE — Progress Notes (Signed)
Patient ID: Michel Hendon  male  QIH:[id number]    DOB: 1961-05-15    DOA: 07/07/2014  PCP: Arnoldo Morale, MD  Brief history of present illness  Patient is a 54 year old male with hypertension, asthma, hyperlipidemia, gout presented to ED with worsening right knee swelling over the last 2 days. Patient also reported feeling short of breath over the last 1 week, mostly exertional, nonproductive cough. He also reports whenever he eats, has epigastric discomfort. In ED issues d-dimer was mildly elevated, CT chest was negative for PE however did show left-sided pneumonia.   Assessment/Plan: Principal Problem:   CAP (community acquired pneumonia) - Continue IV Rocephin and Zithromax,  - influenza panel negative, urine strep antigen negative, urine legionella antigen negative  Active Problems:   Effusion of right knee, right knee pain: still quite tender - X-ray showed moderately large joint effusion with mild anterior soft tissue edema, mild to moderate osteoarthritis - Appreciate orthopedics, Dr Erlinda Hong, arthrocentesis done 1/13, shows monosodium urate crystals - Placed on colchicine, Naprosyn, add prednisone, cont pain meds     Hypertension - Currently stable    Abdominal pain - Ultrasound abdomen showed no acute abnormality, cholelithiasis, gallbladder otherwise unremarkable - HIDA scan done, patent cystic duct.   dysarthria  - Not new per patient he had previous brain injury    DVT Prophylaxis: SCD's  Code Status: FC  Family Communication:  Disposition: hopefully tomorrow, start PT today, OOB, pain management   Consultants: Orthopedics,Dr Erlinda Hong  Procedures:  none  Antibiotics:  IV Zithromax    Rocephin   Subjective: Patient seen and examined, feeling somewhat better today, right knee still painful  Objective  Weight change: -0.7 kg (-1 lb 8.7 oz)  Intake/Output Summary (Last 24 hours) at 07/10/14 1240 Last data filed at 07/10/14 0900  Gross per 24 hour  Intake     720 ml  Output   1100 ml  Net   -380 ml   Blood pressure 105/53, pulse 87, temperature 97.9 F (36.6 C), temperature source Oral, resp. rate 17, height 6' 2.5" (1.892 m), weight 120.2 kg (264 lb 15.9 oz), SpO2 97 %.  Physical Exam: General: Alert and awake, oriented x3, not in any acute distress. CVS: S1-S2 clear, no murmur rubs or gallops ChestDecreased breath sounds at the bases otherwise clear Abd: soft nontender, nondistended, normal bowel sounds  Extremities: no cyanosis, clubbing or edema noted bilaterally, right knee effusion +, tender, ROM dec    Lab Results: Basic Metabolic Panel:  Recent Labs Lab 07/08/14 0550 07/09/14 0601  NA 141 136  K 3.6 3.6  CL 100 102  CO2 26 25  GLUCOSE 99 96  BUN 5* 5*  CREATININE 0.83 0.65  CALCIUM 8.9 8.5   Liver Function Tests:  Recent Labs Lab 07/07/14 1659 07/08/14 0550  AST 26 12  ALT 8 8  ALKPHOS 48 41  BILITOT 2.1* 0.6  PROT 8.4* 7.1  ALBUMIN 3.1* 2.7*    Recent Labs Lab 07/08/14 0550  LIPASE 22   No results for input(s): AMMONIA in the last 168 hours. CBC:  Recent Labs Lab 07/08/14 0550 07/09/14 0601  WBC 9.0 9.6  NEUTROABS 4.8  --   HGB 11.1* 10.9*  HCT 35.6* 34.7*  MCV 76.4* 75.3*  PLT 254 258   Cardiac Enzymes:  Recent Labs Lab 07/07/14 2320 07/08/14 0550 07/08/14 1130  TROPONINI <0.03 <0.03 <0.03   BNP: Invalid input(s): POCBNP CBG: No results for input(s): GLUCAP in the last 168 hours.  Micro Results: Recent Results (from the past 240 hour(s))  Culture, blood (routine x 2)     Status: None (Preliminary result)   Collection Time: 07/07/14 10:15 PM  Result Value Ref Range Status   Specimen Description BLOOD ARM RIGHT  Final   Special Requests BOTTLES DRAWN AEROBIC AND ANAEROBIC 10CC  Final   Culture   Final           BLOOD CULTURE RECEIVED NO GROWTH TO DATE CULTURE WILL BE HELD FOR 5 DAYS BEFORE ISSUING A FINAL NEGATIVE REPORT Performed at [facility]    Report Status  PENDING  Incomplete  Culture, blood (routine x 2)     Status: None (Preliminary result)   Collection Time: 07/07/14 10:20 PM  Result Value Ref Range Status   Specimen Description BLOOD HAND RIGHT  Final   Special Requests BOTTLES DRAWN AEROBIC AND ANAEROBIC 10CC  Final   Culture   Final           BLOOD CULTURE RECEIVED NO GROWTH TO DATE CULTURE WILL BE HELD FOR 5 DAYS BEFORE ISSUING A FINAL NEGATIVE REPORT Performed at [facility]    Report Status PENDING  Incomplete  Body fluid culture     Status: None (Preliminary result)   Collection Time: 07/08/14  1:02 PM  Result Value Ref Range Status   Specimen Description FLUID SYNOVIAL KNEE RIGHT  Final   Special Requests NONE  Final   Gram Stain   Final    FEW WBC PRESENT, PREDOMINANTLY PMN NO ORGANISMS SEEN Performed at [facility]    Culture   Final    NO GROWTH 2 DAYS Performed at [facility]    Report Status PENDING  Incomplete  Culture, sputum-assessment     Status: None   Collection Time: 07/09/14  7:49 PM  Result Value Ref Range Status   Specimen Description SPUTUM  Final   Special Requests NONE  Final   Sputum evaluation   Final    MICROSCOPIC FINDINGS SUGGEST THAT THIS SPECIMEN IS NOT REPRESENTATIVE OF LOWER RESPIRATORY SECRETIONS. PLEASE RECOLLECT. NOTIFIED Vivianne Spence RN 2049 07/09/14 A BROWNING    Report Status 07/09/2014 FINAL  Final    Studies/Results: Dg Chest 2 View  07/07/2014   CLINICAL DATA:  Pain and swelling RIGHT knee down RIGHT leg to foot and ankle, history asthma, hypertension, hyperlipidemia  EXAM: CHEST  2 VIEW  COMPARISON:  06/30/2014  FINDINGS: Normal heart size, mediastinal contours and pulmonary vascularity.  Mild streaky atelectasis in retrocardiac LEFT lower lobe on lateral view.  Remaining lungs clear.  No pleural effusion or pneumothorax.  Bones unremarkable.  IMPRESSION: Streaky atelectasis LEFT lower lobe.   Electronically Signed   By: Lavonia Dana M.D.   On: 07/07/2014 18:17    Dg Chest 2 View  06/30/2014   CLINICAL DATA:  Cough and congestion. Shortness of breath since last night.  EXAM: CHEST  2 VIEW  COMPARISON:  12/25/2013  FINDINGS: Cough. Minimal motion degraded lateral view. Midline trachea. Mild cardiomegaly. No pleural effusion or pneumothorax. Mild scarring at the left lung base. No lobar consolidation.  IMPRESSION: No acute cardiopulmonary disease.   Electronically Signed   By: Abigail Miyamoto M.D.   On: 06/30/2014 10:20   Dg Ankle Complete Right  07/07/2014   CLINICAL DATA:  Right foot and ankle pain.  No known injury.  EXAM: RIGHT ANKLE - COMPLETE 3+ VIEW  COMPARISON:  Right ankle 12/03/2009  FINDINGS: No fracture or dislocation. The  alignment is maintained. The ankle mortise is preserved. Increased osteophytes at the tibiotalar articulation. Unchanged subtalar degenerative change. Proliferative change noted in the midfoot. No erosion or periosteal reaction. Minimal soft tissue edema.  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 No acute osseous abnormality.   Electronically Signed   By: Jeb Levering M.D.   On: 07/07/2014 18:18   Ct Angio Chest Pe W/cm &/or Wo Cm  07/07/2014   CLINICAL DATA:  Shortness of breath, RIGHT leg pain and swelling, history asthma, hypertension, hyperlipidemia  EXAM: CT ANGIOGRAPHY CHEST WITH CONTRAST  TECHNIQUE: Multidetector CT imaging of the chest was performed using the standard protocol during bolus administration of intravenous contrast. Multiplanar CT image reconstructions and MIPs were obtained to evaluate the vascular anatomy.  CONTRAST:  78mL OMNIPAQUE IOHEXOL 350 MG/ML SOLN  COMPARISON:  None  FINDINGS: Aorta normal caliber without aneurysm or dissection.  Large intermediate attenuation mass at upper pole LEFT kidney question mildly complicated cyst 5.1 x 5.0 cm image 137.  Patchiness appearance of RIGHT nephrogram.  No thoracic adenopathy.  Respiratory motion artifacts in the lower lobes.  Pulmonary arteries  minimally prominent size centrally.  No definite pulmonary emboli identified within limitations of motion.  Patchy opacity in the posterior RIGHT lower lobe base may represent infiltrate but requiring followup until resolution to exclude underlying nodules.  Mild atelectasis in infiltrate in LEFT lower lobe and lingular base with focal area of infiltrate versus nodularity in LEFT mid lung 8 mm diameter image 59.  No pleural effusion or pneumothorax.  Collapsed hiatal hernia demonstrating slightly thickened wall though this could be an artifact from underdistention.  Bones unremarkable.  Review of the MIP images confirms the above findings.  IMPRESSION: No definite evidence of pulmonary embolism.  Patchy areas of opacity in the lower lobes as well as in the lingula and LEFT mid lung, likely infiltrate the requiring followup CT assessment in 3-4 months to exclude underlying pulmonary nodules.  Patchy appearance of RIGHT nephrogram, potentially related to beam hardening artifacts but unable to exclude pyelonephritis ; correlation with urinalysis recommended.  5.1 x 5.0 cm diameter intermediate attenuation lesion at the LEFT kidney, incompletely visualized, recommend followup sonographic assessment to exclude cystic tumor.   Electronically Signed   By: Lavonia Dana M.D.   On: 07/07/2014 20:42   Nm Hepatobiliary Liver Func  07/09/2014   CLINICAL DATA:  Abdominal pain.  EXAM: NUCLEAR MEDICINE HEPATOBILIARY IMAGING  TECHNIQUE: Sequential images of the abdomen were obtained out to 60 minutes following intravenous administration of radiopharmaceutical.  RADIOPHARMACEUTICALS:  5.0 Millicurie JA-25K Choletec  COMPARISON:  Ultrasound 07/08/2014.  FINDINGS: Liver, biliary system, and bowel appear normally. Gallbladder not visualized at 1 hr. 4.8 mg of morphine administered. This resulted in visualization the gallbladder within 10 min.  IMPRESSION: Delayed visualization gallbladder. Gallbladder was visualized following  administration of morphine. This indicates cystic duct patency.   Electronically Signed   By: Marcello Moores  Register   On: 07/09/2014 10:28   US Abdomen Complete  07/08/2014   CLINICAL DATA:  Acute onset of generalized abdominal pain. Initial encounter.  EXAM: ULTRASOUND ABDOMEN COMPLETE  COMPARISON:  CT of the abdomen and pelvis from 10/02/2011  FINDINGS: Gallbladder: Numerous stones are seen dependently in the gallbladder, measuring up to 0.5 cm in size. No gallbladder wall thickening or pericholecystic fluid is seen. No ultrasonographic Murphy's sign is elicited.  Common bile duct: Diameter: 0.3 cm, within normal limits in caliber.  Liver: No focal lesion identified. Within normal limits in  parenchymal echogenicity.  IVC: No abnormality visualized.  Pancreas: Visualized portion unremarkable.  Spleen: Size and appearance within normal limits.  Right Kidney: Length: 10.0 cm. Echogenicity within normal limits. No mass or hydronephrosis visualized.  Left Kidney: Length: 11.2 cm. Echogenicity within normal limits. No hydronephrosis visualized. A 5.5 x 4.9 x 4.4 cm cyst is noted at the lower pole of the left kidney.  Abdominal aorta: No aneurysm visualized. Not characterized distally due to overlying bowel gas.  Other findings: None.  IMPRESSION: 1. No acute abnormality seen within the abdomen. 2. Cholelithiasis; gallbladder otherwise unremarkable. 3. 5.5 cm left renal cyst noted.   Electronically Signed   By: Garald Balding M.D.   On: 07/08/2014 03:43   Dg Knee Complete 4 Views Right  07/07/2014   CLINICAL DATA:  Right knee pain and swelling. Pain radiates down leg. No known injury.  EXAM: RIGHT KNEE - COMPLETE 4+ VIEW  COMPARISON:  None.  FINDINGS: No fracture or dislocation. The alignment is maintained. Peripheral osteophytes of the medial greater than latter tibial femoral compartment. No significant joint space narrowing. No osseous erosions. There is a moderately large joint effusion and anterior soft tissue  edema.  IMPRESSION: 1. Mild-to-moderate osteoarthritis.  No acute bony abnormality. 2. Moderately large joint effusion, mild anterior soft tissue edema.   Electronically Signed   By: Jeb Levering M.D.   On: 07/07/2014 18:15   Dg Foot Complete Right  07/07/2014   CLINICAL DATA:  Swelling without trauma.  Pain.  EXAM: RIGHT FOOT COMPLETE - 3+ VIEW  COMPARISON:  12/03/2009  FINDINGS: Mild diffuse soft tissue swelling, especially about the forefoot. Scattered degenerate changes, including about the midfoot anteriorly. No soft tissue gas. No radiopaque foreign object. No osseous destruction.  IMPRESSION: Soft tissue swelling, without acute finding.   Electronically Signed   By: Abigail Miyamoto M.D.   On: 07/07/2014 18:16    Medications: Scheduled Meds: . azithromycin  500 mg Intravenous Q24H  . cefTRIAXone (ROCEPHIN)  IV  1 g Intravenous Q24H  . colchicine  0.6 mg Oral Daily  . naproxen  375 mg Oral BID WC  . predniSONE  40 mg Oral Q breakfast      LOS: 3 days   Ardean Melroy M.D. [facility] 07/10/2014, 12:40 PM Pager: [phone]  If 7PM-7AM, please contact night-coverage [email or web address] Password [email or web address]

## 2014-07-11 ENCOUNTER — Inpatient Hospital Stay (HOSPITAL_COMMUNITY): Payer: Medicare Other

## 2014-07-11 MED ORDER — IPRATROPIUM-ALBUTEROL 0.5-2.5 (3) MG/3ML IN SOLN
3.0000 mL | Freq: Three times a day (TID) | RESPIRATORY_TRACT | Status: DC
  Administered 2014-07-11 (×3): 3 mL via RESPIRATORY_TRACT
  Filled 2014-07-11 (×4): qty 3

## 2014-07-11 MED ORDER — BENZONATATE 100 MG PO CAPS
100.0000 mg | ORAL_CAPSULE | Freq: Three times a day (TID) | ORAL | Status: DC
  Administered 2014-07-11 – 2014-07-12 (×5): 100 mg via ORAL
  Filled 2014-07-11 (×7): qty 1

## 2014-07-11 MED ORDER — HYDROCODONE-HOMATROPINE 5-1.5 MG/5ML PO SYRP
5.0000 mL | ORAL_SOLUTION | Freq: Four times a day (QID) | ORAL | Status: DC | PRN
  Administered 2014-07-12: 5 mL via ORAL
  Filled 2014-07-11: qty 5

## 2014-07-11 MED ORDER — AZITHROMYCIN 500 MG PO TABS
500.0000 mg | ORAL_TABLET | Freq: Every day | ORAL | Status: DC
  Administered 2014-07-11: 500 mg via ORAL
  Filled 2014-07-11 (×2): qty 1

## 2014-07-11 MED ORDER — MENTHOL 3 MG MT LOZG
1.0000 | LOZENGE | OROMUCOSAL | Status: DC | PRN
  Filled 2014-07-11: qty 9

## 2014-07-11 MED ORDER — GUAIFENESIN ER 600 MG PO TB12
600.0000 mg | ORAL_TABLET | Freq: Two times a day (BID) | ORAL | Status: DC
  Administered 2014-07-11 – 2014-07-12 (×3): 600 mg via ORAL
  Filled 2014-07-11 (×4): qty 1

## 2014-07-11 NOTE — Progress Notes (Signed)
Patient ID: Roberto Horne  male  ZHY:[id number]    DOB: 1961/02/04    DOA: 07/07/2014  PCP: Arnoldo Morale, MD  Brief history of present illness  Patient is a 54 year old male with hypertension, asthma, hyperlipidemia, gout presented to ED with worsening right knee swelling over the last 2 days. Patient also reported feeling short of breath over the last 1 week, mostly exertional, nonproductive cough. He also reports whenever he eats, has epigastric discomfort. In ED issues d-dimer was mildly elevated, CT chest was negative for PE however did show left-sided pneumonia.   Assessment/Plan: Principal Problem:   CAP (community acquired pneumonia): Lungs rhonchorous - Continue IV Rocephin and Zithromax,  - influenza panel negative, urine strep antigen negative, urine legionella antigen negative - Chest x-ray showed left lower lobe opacity with mild progression, placed on scheduled nebs, Mucinex, Robitussin, Tessalon  Active Problems:   Effusion of right knee, right knee pain: Improving per patient - X-ray showed moderately large joint effusion with mild anterior soft tissue edema, mild to moderate osteoarthritis - Appreciate orthopedics, Dr Erlinda Hong, arthrocentesis done 1/13, shows monosodium urate crystals - Placed on colchicine, Naprosyn, added prednisone, cont pain meds     Hypertension - Currently stable    Abdominal pain - Ultrasound abdomen showed no acute abnormality, cholelithiasis, gallbladder otherwise unremarkable - HIDA scan done, patent cystic duct.   dysarthria  - Not new per patient he had previous brain injury    DVT Prophylaxis: SCD's  Code Status: FC  Family Communication:  Disposition: hopefully tomorrow  Consultants: Orthopedics, Dr Erlinda Hong  Procedures:  none  Antibiotics:  IV Zithromax    Rocephin   Subjective: Patient seen and examined, coughing with rhonchorous lungs, afebrile  Objective  Weight change: 1 kg (2 lb 3.3 oz)  Intake/Output Summary  (Last 24 hours) at 07/11/14 1053 Last data filed at 07/11/14 1000  Gross per 24 hour  Intake    480 ml  Output   1650 ml  Net  -1170 ml   Blood pressure 112/87, pulse 82, temperature 97.5 F (36.4 C), temperature source Oral, resp. rate 19, height 6' 2.5" (1.892 m), weight 121.2 kg (267 lb 3.2 oz), SpO2 99 %.  Physical Exam: General: Alert and awake, oriented x3, not in any acute distress. CVS: S1-S2 clear, no murmur rubs or gallops Chest: Diffuse rhonchi bilaterally Abd: soft nontender, nondistended, normal bowel sounds  Extremities: no cyanosis, clubbing or edema noted bilaterally, right knee effusion +, improving   Lab Results: Basic Metabolic Panel:  Recent Labs Lab 07/08/14 0550 07/09/14 0601  NA 141 136  K 3.6 3.6  CL 100 102  CO2 26 25  GLUCOSE 99 96  BUN 5* 5*  CREATININE 0.83 0.65  CALCIUM 8.9 8.5   Liver Function Tests:  Recent Labs Lab 07/07/14 1659 07/08/14 0550  AST 26 12  ALT 8 8  ALKPHOS 48 41  BILITOT 2.1* 0.6  PROT 8.4* 7.1  ALBUMIN 3.1* 2.7*    Recent Labs Lab 07/08/14 0550  LIPASE 22   No results for input(s): AMMONIA in the last 168 hours. CBC:  Recent Labs Lab 07/08/14 0550 07/09/14 0601  WBC 9.0 9.6  NEUTROABS 4.8  --   HGB 11.1* 10.9*  HCT 35.6* 34.7*  MCV 76.4* 75.3*  PLT 254 258   Cardiac Enzymes:  Recent Labs Lab 07/07/14 2320 07/08/14 0550 07/08/14 1130  TROPONINI <0.03 <0.03 <0.03   BNP: Invalid input(s): POCBNP CBG: No results for input(s): GLUCAP in the last 168  hours.   Micro Results: Recent Results (from the past 240 hour(s))  Culture, blood (routine x 2)     Status: None (Preliminary result)   Collection Time: 07/07/14 10:15 PM  Result Value Ref Range Status   Specimen Description BLOOD ARM RIGHT  Final   Special Requests BOTTLES DRAWN AEROBIC AND ANAEROBIC 10CC  Final   Culture   Final           BLOOD CULTURE RECEIVED NO GROWTH TO DATE CULTURE WILL BE HELD FOR 5 DAYS BEFORE ISSUING A FINAL  NEGATIVE REPORT Performed at [facility]    Report Status PENDING  Incomplete  Culture, blood (routine x 2)     Status: None (Preliminary result)   Collection Time: 07/07/14 10:20 PM  Result Value Ref Range Status   Specimen Description BLOOD HAND RIGHT  Final   Special Requests BOTTLES DRAWN AEROBIC AND ANAEROBIC 10CC  Final   Culture   Final           BLOOD CULTURE RECEIVED NO GROWTH TO DATE CULTURE WILL BE HELD FOR 5 DAYS BEFORE ISSUING A FINAL NEGATIVE REPORT Performed at [facility]    Report Status PENDING  Incomplete  Body fluid culture     Status: None (Preliminary result)   Collection Time: 07/08/14  1:02 PM  Result Value Ref Range Status   Specimen Description FLUID SYNOVIAL KNEE RIGHT  Final   Special Requests NONE  Final   Gram Stain   Final    FEW WBC PRESENT, PREDOMINANTLY PMN NO ORGANISMS SEEN Performed at [facility]    Culture   Final    NO GROWTH 3 DAYS Performed at [facility]    Report Status PENDING  Incomplete  Culture, sputum-assessment     Status: None   Collection Time: 07/09/14  7:49 PM  Result Value Ref Range Status   Specimen Description SPUTUM  Final   Special Requests NONE  Final   Sputum evaluation   Final    MICROSCOPIC FINDINGS SUGGEST THAT THIS SPECIMEN IS NOT REPRESENTATIVE OF LOWER RESPIRATORY SECRETIONS. PLEASE RECOLLECT. Jordan Likes RN [id number] 07/09/14 A BROWNING    Report Status 07/09/2014 FINAL  Final    Studies/Results: Dg Chest 2 View  07/11/2014   CLINICAL DATA:  Productive cough producing green sputum. Chest congestion.  EXAM: CHEST  2 VIEW  COMPARISON:  Chest radiographs dated 06/30/2014 and chest CT dated 07/07/2014.  FINDINGS: Mildly enlarged cardiac silhouette without significant change. Mild left lower lobe airspace opacity, partially obscured by the heart. Clear right lung. Diffuse osteopenia.  IMPRESSION: Mild left lower lobe airspace opacity with mild progression. This is suspicious for  pneumonia.   Electronically Signed   By: Enrique Sack M.D.   On: 07/11/2014 09:33   Dg Chest 2 View  07/07/2014   CLINICAL DATA:  Pain and swelling RIGHT knee down RIGHT leg to foot and ankle, history asthma, hypertension, hyperlipidemia  EXAM: CHEST  2 VIEW  COMPARISON:  06/30/2014  FINDINGS: Normal heart size, mediastinal contours and pulmonary vascularity.  Mild streaky atelectasis in retrocardiac LEFT lower lobe on lateral view.  Remaining lungs clear.  No pleural effusion or pneumothorax.  Bones unremarkable.  IMPRESSION: Streaky atelectasis LEFT lower lobe.   Electronically Signed   By: Lavonia Dana M.D.   On: 07/07/2014 18:17   Dg Chest 2 View  06/30/2014   CLINICAL DATA:  Cough and congestion. Shortness of breath since last night.  EXAM: CHEST  2 VIEW  COMPARISON:  12/25/2013  FINDINGS: Cough. Minimal motion degraded lateral view. Midline trachea. Mild cardiomegaly. No pleural effusion or pneumothorax. Mild scarring at the left lung base. No lobar consolidation.  IMPRESSION: No acute cardiopulmonary disease.   Electronically Signed   By: Abigail Miyamoto M.D.   On: 06/30/2014 10:20   Dg Ankle Complete Right  07/07/2014   CLINICAL DATA:  Right foot and ankle pain.  No known injury.  EXAM: RIGHT ANKLE - COMPLETE 3+ VIEW  COMPARISON:  Right ankle 12/03/2009  FINDINGS: No fracture or dislocation. The alignment is maintained. The ankle mortise is preserved. Increased osteophytes at the tibiotalar articulation. Unchanged subtalar degenerative change. Proliferative change noted in the midfoot. No erosion or periosteal reaction. Minimal soft tissue edema.  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 No acute osseous abnormality.   Electronically Signed   By: Jeb Levering M.D.   On: 07/07/2014 18:18   Ct Angio Chest Pe W/cm &/or Wo Cm  07/07/2014   CLINICAL DATA:  Shortness of breath, RIGHT leg pain and swelling, history asthma, hypertension, hyperlipidemia  EXAM: CT ANGIOGRAPHY CHEST WITH  CONTRAST  TECHNIQUE: Multidetector CT imaging of the chest was performed using the standard protocol during bolus administration of intravenous contrast. Multiplanar CT image reconstructions and MIPs were obtained to evaluate the vascular anatomy.  CONTRAST:  75mL OMNIPAQUE IOHEXOL 350 MG/ML SOLN  COMPARISON:  None  FINDINGS: Aorta normal caliber without aneurysm or dissection.  Large intermediate attenuation mass at upper pole LEFT kidney question mildly complicated cyst 5.1 x 5.0 cm image 137.  Patchiness appearance of RIGHT nephrogram.  No thoracic adenopathy.  Respiratory motion artifacts in the lower lobes.  Pulmonary arteries minimally prominent size centrally.  No definite pulmonary emboli identified within limitations of motion.  Patchy opacity in the posterior RIGHT lower lobe base may represent infiltrate but requiring followup until resolution to exclude underlying nodules.  Mild atelectasis in infiltrate in LEFT lower lobe and lingular base with focal area of infiltrate versus nodularity in LEFT mid lung 8 mm diameter image 59.  No pleural effusion or pneumothorax.  Collapsed hiatal hernia demonstrating slightly thickened wall though this could be an artifact from underdistention.  Bones unremarkable.  Review of the MIP images confirms the above findings.  IMPRESSION: No definite evidence of pulmonary embolism.  Patchy areas of opacity in the lower lobes as well as in the lingula and LEFT mid lung, likely infiltrate the requiring followup CT assessment in 3-4 months to exclude underlying pulmonary nodules.  Patchy appearance of RIGHT nephrogram, potentially related to beam hardening artifacts but unable to exclude pyelonephritis ; correlation with urinalysis recommended.  5.1 x 5.0 cm diameter intermediate attenuation lesion at the LEFT kidney, incompletely visualized, recommend followup sonographic assessment to exclude cystic tumor.   Electronically Signed   By: Lavonia Dana M.D.   On: 07/07/2014 20:42     Nm Hepatobiliary Liver Func  07/09/2014   CLINICAL DATA:  Abdominal pain.  EXAM: NUCLEAR MEDICINE HEPATOBILIARY IMAGING  TECHNIQUE: Sequential images of the abdomen were obtained out to 60 minutes following intravenous administration of radiopharmaceutical.  RADIOPHARMACEUTICALS:  5.0 [place] HW-38U Choletec  COMPARISON:  Ultrasound 07/08/2014.  FINDINGS: Liver, biliary system, and bowel appear normally. Gallbladder not visualized at 1 hr. 4.8 mg of morphine administered. This resulted in visualization the gallbladder within 10 min.  IMPRESSION: Delayed visualization gallbladder. Gallbladder was visualized following administration of morphine. This indicates cystic duct patency.   Electronically Signed   By:  [place]   On: 07/09/2014 10:28   US Abdomen Complete  07/08/2014   CLINICAL DATA:  Acute onset of generalized abdominal pain. Initial encounter.  EXAM: ULTRASOUND ABDOMEN COMPLETE  COMPARISON:  CT of the abdomen and pelvis from 10/02/2011  FINDINGS: Gallbladder: Numerous stones are seen dependently in the gallbladder, measuring up to 0.5 cm in size. No gallbladder wall thickening or pericholecystic fluid is seen. No ultrasonographic Murphy's sign is elicited.  Common bile duct: Diameter: 0.3 cm, within normal limits in caliber.  Liver: No focal lesion identified. Within normal limits in parenchymal echogenicity.  IVC: No abnormality visualized.  Pancreas: Visualized portion unremarkable.  Spleen: Size and appearance within normal limits.  Right Kidney: Length: 10.0 cm. Echogenicity within normal limits. No mass or hydronephrosis visualized.  Left Kidney: Length: 11.2 cm. Echogenicity within normal limits. No hydronephrosis visualized. A 5.5 x 4.9 x 4.4 cm cyst is noted at the lower pole of the left kidney.  Abdominal aorta: No aneurysm visualized. Not characterized distally due to overlying bowel gas.  Other findings: None.  IMPRESSION: 1. No acute abnormality seen within the abdomen. 2.  Cholelithiasis; gallbladder otherwise unremarkable. 3. 5.5 cm left renal cyst noted.   Electronically Signed   By: Garald Balding M.D.   On: 07/08/2014 03:43   Dg Knee Complete 4 Views Right  07/07/2014   CLINICAL DATA:  Right knee pain and swelling. Pain radiates down leg. No known injury.  EXAM: RIGHT KNEE - COMPLETE 4+ VIEW  COMPARISON:  None.  FINDINGS: No fracture or dislocation. The alignment is maintained. Peripheral osteophytes of the medial greater than latter tibial femoral compartment. No significant joint space narrowing. No osseous erosions. There is a moderately large joint effusion and anterior soft tissue edema.  IMPRESSION: 1. Mild-to-moderate osteoarthritis.  No acute bony abnormality. 2. Moderately large joint effusion, mild anterior soft tissue edema.   Electronically Signed   By: Jeb Levering M.D.   On: 07/07/2014 18:15   Dg Foot Complete Right  07/07/2014   CLINICAL DATA:  Swelling without trauma.  Pain.  EXAM: RIGHT FOOT COMPLETE - 3+ VIEW  COMPARISON:  12/03/2009  FINDINGS: Mild diffuse soft tissue swelling, especially about the forefoot. Scattered degenerate changes, including about the midfoot anteriorly. No soft tissue gas. No radiopaque foreign object. No osseous destruction.  IMPRESSION: Soft tissue swelling, without acute finding.   Electronically Signed   By: Abigail Miyamoto M.D.   On: 07/07/2014 18:16    Medications: Scheduled Meds: . azithromycin  500 mg Oral Daily  . benzonatate  100 mg Oral TID  . cefTRIAXone (ROCEPHIN)  IV  1 g Intravenous Q24H  . colchicine  0.6 mg Oral Daily  . guaiFENesin  600 mg Oral BID  . ipratropium-albuterol  3 mL Nebulization TID  . naproxen  375 mg Oral BID WC  . predniSONE  40 mg Oral Q breakfast      LOS: 4 days   Lason Eveland M.D. [facility] 07/11/2014, 10:53 AM Pager: [phone]  If 7PM-7AM, please contact night-coverage [email or web address] Password [email or web address]

## 2014-07-12 LAB — BODY FLUID CULTURE: CULTURE: NO GROWTH

## 2014-07-12 MED ORDER — GUAIFENESIN ER 600 MG PO TB12: 600.0000 mg | ORAL_TABLET | Freq: Two times a day (BID) | ORAL | Status: DC

## 2014-07-12 MED ORDER — PANTOPRAZOLE SODIUM 40 MG PO TBEC
40.0000 mg | DELAYED_RELEASE_TABLET | Freq: Every day | ORAL | Status: DC
  Administered 2014-07-12: 40 mg via ORAL
  Filled 2014-07-12: qty 1

## 2014-07-12 MED ORDER — PREDNISONE 10 MG PO TABS: ORAL_TABLET | ORAL | Status: DC

## 2014-07-12 MED ORDER — ALBUTEROL SULFATE HFA 108 (90 BASE) MCG/ACT IN AERS: 2.0000 | INHALATION_SPRAY | Freq: Four times a day (QID) | RESPIRATORY_TRACT | Status: DC | PRN

## 2014-07-12 MED ORDER — HYDROCODONE-HOMATROPINE 5-1.5 MG/5ML PO SYRP: 5.0000 mL | ORAL_SOLUTION | Freq: Four times a day (QID) | ORAL | Status: DC | PRN

## 2014-07-12 MED ORDER — IPRATROPIUM-ALBUTEROL 0.5-2.5 (3) MG/3ML IN SOLN
3.0000 mL | RESPIRATORY_TRACT | Status: DC
  Administered 2014-07-12 (×2): 3 mL via RESPIRATORY_TRACT
  Filled 2014-07-12 (×3): qty 3

## 2014-07-12 MED ORDER — BENZONATATE 100 MG PO CAPS: 100.0000 mg | ORAL_CAPSULE | Freq: Three times a day (TID) | ORAL | Status: DC | PRN

## 2014-07-12 MED ORDER — LEVOFLOXACIN IN D5W 750 MG/150ML IV SOLN
750.0000 mg | INTRAVENOUS | Status: DC
  Administered 2014-07-12: 750 mg via INTRAVENOUS
  Filled 2014-07-12 (×3): qty 150

## 2014-07-12 MED ORDER — PANTOPRAZOLE SODIUM 40 MG PO TBEC: 40.0000 mg | DELAYED_RELEASE_TABLET | Freq: Every day | ORAL | Status: DC

## 2014-07-12 NOTE — Discharge Summary (Signed)
Physician Discharge Summary  Patient ID: Roberto Horne MRN: [id number] DOB/AGE: 1960/11/02 54 y.o.  Admit date: 07/07/2014 Discharge date: 07/12/2014  Primary Care Physician:  Arnoldo Morale, MD  Discharge Diagnoses:    . CAP (community acquired pneumonia) . Effusion of right knee status post arthrocentesis    Acute gout  . Hypertension . Abdominal pain and cholelithiasis but no cholecystitis  . 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 Ibuprofen Itching     Discharge Medications:   Medication List    STOP taking these medications        hydrocortisone 2.5 % lotion      TAKE these medications        acetaminophen 500 MG tablet  Commonly known as:  TYLENOL  Take 1 tablet (500 mg total) by mouth every 6 (six) hours as needed.     albuterol 108 (90 BASE) MCG/ACT inhaler  Commonly known as:  PROVENTIL HFA;VENTOLIN HFA  Inhale 2 puffs into the lungs every 6 (six) hours as needed for wheezing or shortness of breath.     benzonatate 100 MG capsule  Commonly known as:  TESSALON  Take 1 capsule (100 mg total) by mouth 3 (three) times daily as needed for cough.     cetirizine 10 MG tablet  Commonly known as:  ZYRTEC ALLERGY  Take 1 tablet (10 mg total) by mouth daily.     colchicine 0.6 MG tablet  Take 1 tablet (0.6 mg total) by mouth daily.     fluticasone 50 MCG/ACT nasal spray  Commonly known as:  FLONASE  Place 1 spray into both nostrils daily.     guaiFENesin 600 MG 12 hr tablet  Commonly known as:  MUCINEX  Take 1 tablet (600 mg total) by mouth 2 (two) times daily.     HYDROcodone-acetaminophen 5-325 MG per tablet  Commonly known as:  NORCO/VICODIN  Take 1-2 tablets by mouth every 6 (six) hours as needed for moderate pain or  severe pain.     HYDROcodone-homatropine 5-1.5 MG/5ML syrup  Commonly known as:  HYCODAN  Take 5 mLs by mouth every 6 (six) hours as needed for cough.     levofloxacin 750 MG tablet  Commonly known as:  LEVAQUIN  Take 1 tablet (750 mg total) by mouth daily. X 5 days     naproxen 375 MG tablet  Commonly known as:  NAPROSYN  Take 1 tablet (375 mg total) by mouth 2 (two) times daily with a meal.     pantoprazole 40 MG tablet  Commonly known as:  PROTONIX  Take 1 tablet (40 mg total) by mouth daily.     predniSONE 10 MG tablet  Commonly known as:  DELTASONE  Prednisone dosing: Take  Prednisone 40mg  (4 tabs) x 2 days, then taper to 30mg  (3 tabs) x 2 days, then 20mg  (2 tabs) x 2days, then 10mg  (1 tab) x 2days, then OFF.  Start taking on:  07/13/2014     sodium chloride 0.65 % Soln nasal spray  Commonly known as:  OCEAN  Place 1 spray into both nostrils 2 (two) times daily.         Brief H and P: For complete details please refer to admission H and P, but in brief Patient  is a 54 year old male with hypertension, asthma, hyperlipidemia, gout presented to ED with worsening right knee swelling over the last 2 days. Patient also reported feeling short of breath over the last 1 week, mostly exertional, nonproductive cough. He also reports whenever he eats, has epigastric discomfort. In ED issues d-dimer was mildly elevated, CT chest was negative for PE however did show left-sided pneumonia.  Hospital Course:   CAP (community acquired pneumonia): Slowly improving. D-dimer was mildly elevated hence patient underwent CT of the chest which was negative for pulmonary embolism however did show left-sided pneumonia. Patient was placed on IV Zithromax and Rocephin. Influenza panel negative, urine strep antigen negative, urine legionella antigen negative. Blood cultures have been negative so far. Patient will continue albuterol inhaler, Mucinex, Robitussin, Tessalon, oral Levaquin for another 5 days to  complete full course.   Effusion of right knee, right knee pain: Improving per patient - X-ray showed moderately large joint effusion with mild anterior soft tissue edema, mild to moderate osteoarthritis - Appreciate orthopedics, Dr Erlinda Hong, arthrocentesis done 1/13, shows monosodium urate crystals. Placed on colchicine, Naprosyn, added prednisone with taper, cont pain meds.    Hypertension - Currently stable   Abdominal pain - Ultrasound abdomen showed no acute abnormality, cholelithiasis, gallbladder otherwise unremarkable - HIDA scan done, patent cystic duct.  dysarthria  - Not new per patient he had previous brain injury     Day of Discharge BP 104/71 mmHg  Pulse 90  Temp(Src) 98.2 F (36.8 C) (Oral)  Resp 20  Ht 6' 2.5" (1.892 m)  Wt 124.3 kg (274 lb 0.5 oz)  BMI 34.72 kg/m2  SpO2 98%  Physical Exam: General: Alert and awake oriented x3 not in any acute distress. CVS: S1-S2 clear no murmur rubs or gallops Chest: Mild scattered wheezing bilaterally improving Abdomen: soft nontender, nondistended, normal bowel sounds Extremities: no cyanosis, clubbing or edema noted bilaterally    The results of significant diagnostics from this hospitalization (including imaging, microbiology, ancillary and laboratory) are listed below for reference.    LAB RESULTS: Basic Metabolic Panel:  Recent Labs Lab 07/08/14 0550 07/09/14 0601  NA 141 136  K 3.6 3.6  CL 100 102  CO2 26 25  GLUCOSE 99 96  BUN 5* 5*  CREATININE 0.83 0.65  CALCIUM 8.9 8.5   Liver Function Tests:  Recent Labs Lab 07/07/14 1659 07/08/14 0550  AST 26 12  ALT 8 8  ALKPHOS 48 41  BILITOT 2.1* 0.6  PROT 8.4* 7.1  ALBUMIN 3.1* 2.7*    Recent Labs Lab 07/08/14 0550  LIPASE 22   No results for input(s): AMMONIA in the last 168 hours. CBC:  Recent Labs Lab 07/08/14 0550 07/09/14 0601  WBC 9.0 9.6  NEUTROABS 4.8  --   HGB 11.1* 10.9*  HCT 35.6* 34.7*  MCV 76.4* 75.3*  PLT 254 258    Cardiac Enzymes:  Recent Labs Lab 07/08/14 0550 07/08/14 1130  TROPONINI <0.03 <0.03   BNP: Invalid input(s): POCBNP CBG: No results for input(s): GLUCAP in the last 168 hours.  Significant Diagnostic Studies:  Dg Chest 2 View  07/07/2014   CLINICAL DATA:  Pain and swelling RIGHT knee down RIGHT leg to foot and ankle, history asthma, hypertension, hyperlipidemia  EXAM: CHEST  2 VIEW  COMPARISON:  06/30/2014  FINDINGS: Normal heart size, mediastinal contours and pulmonary vascularity.  Mild streaky atelectasis in retrocardiac LEFT lower lobe on lateral view.  Remaining lungs clear.  No pleural effusion or pneumothorax.  Bones unremarkable.  IMPRESSION: Streaky atelectasis LEFT lower lobe.   Electronically Signed   By: Lavonia Dana M.D.   On: 07/07/2014 18:17   Dg Ankle Complete Right  07/07/2014   CLINICAL DATA:  Right foot and ankle pain.  No known injury.  EXAM: RIGHT ANKLE - COMPLETE 3+ VIEW  COMPARISON:  Right ankle 12/03/2009  FINDINGS: No fracture or dislocation. The alignment is maintained. The ankle mortise is preserved. Increased osteophytes at the tibiotalar articulation. Unchanged subtalar degenerative change. Proliferative change noted in the midfoot. No erosion or periosteal reaction. Minimal soft tissue edema.  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 No acute osseous abnormality.   Electronically Signed   By: Jeb Levering M.D.   On: 07/07/2014 18:18   Ct Angio Chest Pe W/cm &/or Wo Cm  07/07/2014   CLINICAL DATA:  Shortness of breath, RIGHT leg pain and swelling, history asthma, hypertension, hyperlipidemia  EXAM: CT ANGIOGRAPHY CHEST WITH CONTRAST  TECHNIQUE: Multidetector CT imaging of the chest was performed using the standard protocol during bolus administration of intravenous contrast. Multiplanar CT image reconstructions and MIPs were obtained to evaluate the vascular anatomy.  CONTRAST:  32mL OMNIPAQUE IOHEXOL 350 MG/ML SOLN  COMPARISON:  None   FINDINGS: Aorta normal caliber without aneurysm or dissection.  Large intermediate attenuation mass at upper pole LEFT kidney question mildly complicated cyst 5.1 x 5.0 cm image 137.  Patchiness appearance of RIGHT nephrogram.  No thoracic adenopathy.  Respiratory motion artifacts in the lower lobes.  Pulmonary arteries minimally prominent size centrally.  No definite pulmonary emboli identified within limitations of motion.  Patchy opacity in the posterior RIGHT lower lobe base may represent infiltrate but requiring followup until resolution to exclude underlying nodules.  Mild atelectasis in infiltrate in LEFT lower lobe and lingular base with focal area of infiltrate versus nodularity in LEFT mid lung 8 mm diameter image 59.  No pleural effusion or pneumothorax.  Collapsed hiatal hernia demonstrating slightly thickened wall though this could be an artifact from underdistention.  Bones unremarkable.  Review of the MIP images confirms the above findings.  IMPRESSION: No definite evidence of pulmonary embolism.  Patchy areas of opacity in the lower lobes as well as in the lingula and LEFT mid lung, likely infiltrate the requiring followup CT assessment in 3-4 months to exclude underlying pulmonary nodules.  Patchy appearance of RIGHT nephrogram, potentially related to beam hardening artifacts but unable to exclude pyelonephritis ; correlation with urinalysis recommended.  5.1 x 5.0 cm diameter intermediate attenuation lesion at the LEFT kidney, incompletely visualized, recommend followup sonographic assessment to exclude cystic tumor.   Electronically Signed   By: Lavonia Dana M.D.   On: 07/07/2014 20:42   US Abdomen Complete  07/08/2014   CLINICAL DATA:  Acute onset of generalized abdominal pain. Initial encounter.  EXAM: ULTRASOUND ABDOMEN COMPLETE  COMPARISON:  CT of the abdomen and pelvis from 10/02/2011  FINDINGS: Gallbladder: Numerous stones are seen dependently in the gallbladder, measuring up to 0.5 cm in  size. No gallbladder wall thickening or pericholecystic fluid is seen. No ultrasonographic Murphy's sign is elicited.  Common bile duct: Diameter: 0.3 cm, within normal limits in caliber.  Liver: No focal lesion identified. Within normal limits in parenchymal echogenicity.  IVC: No abnormality visualized.  Pancreas: Visualized portion unremarkable.  Spleen: Size and appearance within normal limits.  Right Kidney: Length: 10.0 cm. Echogenicity within normal limits. No mass or hydronephrosis visualized.  Left Kidney: Length: 11.2 cm. Echogenicity within normal limits. No hydronephrosis  visualized. A 5.5 x 4.9 x 4.4 cm cyst is noted at the lower pole of the left kidney.  Abdominal aorta: No aneurysm visualized. Not characterized distally due to overlying bowel gas.  Other findings: None.  IMPRESSION: 1. No acute abnormality seen within the abdomen. 2. Cholelithiasis; gallbladder otherwise unremarkable. 3. 5.5 cm left renal cyst noted.   Electronically Signed   By: Garald Balding M.D.   On: 07/08/2014 03:43   Dg Knee Complete 4 Views Right  07/07/2014   CLINICAL DATA:  Right knee pain and swelling. Pain radiates down leg. No known injury.  EXAM: RIGHT KNEE - COMPLETE 4+ VIEW  COMPARISON:  None.  FINDINGS: No fracture or dislocation. The alignment is maintained. Peripheral osteophytes of the medial greater than latter tibial femoral compartment. No significant joint space narrowing. No osseous erosions. There is a moderately large joint effusion and anterior soft tissue edema.  IMPRESSION: 1. Mild-to-moderate osteoarthritis.  No acute bony abnormality. 2. Moderately large joint effusion, mild anterior soft tissue edema.   Electronically Signed   By: Jeb Levering M.D.   On: 07/07/2014 18:15   Dg Foot Complete Right  07/07/2014   CLINICAL DATA:  Swelling without trauma.  Pain.  EXAM: RIGHT FOOT COMPLETE - 3+ VIEW  COMPARISON:  12/03/2009  FINDINGS: Mild diffuse soft tissue swelling, especially about the forefoot.  Scattered degenerate changes, including about the midfoot anteriorly. No soft tissue gas. No radiopaque foreign object. No osseous destruction.  IMPRESSION: Soft tissue swelling, without acute finding.   Electronically Signed   By: Abigail Miyamoto M.D.   On: 07/07/2014 18:16      Disposition and Follow-up: Discharge Instructions    Diet - low sodium heart healthy    Complete by:  As directed      Discharge instructions    Complete by:  As directed   Please use your inhaler three times day for next 3 days, then twice a day.     Increase activity slowly    Complete by:  As directed             DISPOSITION: Home DISCHARGE FOLLOW-UP Follow-up Information    Follow up with Marianna Payment, MD In 2 weeks.   Specialty:  Orthopedic Surgery   Contact information:   [place] [id number] [phone]       Follow up with Arnoldo Morale, MD. Schedule an appointment as soon as possible for a visit in 2 weeks.   Specialty:  Family Medicine   Why:  for hospital follow-up   Contact information:   [address] [place] [address] [phone]        Time spent on Discharge: 35 mins  Signed:   RAI,RIPUDEEP M.D. [facility] 07/12/2014, 11:38 AM Pager: [phone]

## 2014-07-12 NOTE — Care Management Note (Signed)
CARE MANAGEMENT NOTE 07/12/2014  Patient:  Roberto Horne, Roberto Horne   Account Number:  [id number]  Date Initiated:  07/12/2014  Documentation initiated by:  Alven Alverio  Subjective/Objective Assessment:   CM following for progression and d/c planning.     Action/Plan:   07/12/2014 Met with pt who plans to return to return to home , O2 dep at home with O2 from [place].   Anticipated DC Date:  07/14/2014   Anticipated DC Plan:  [place]         Choice offered to / List presented to:             Status of service:  In process, will continue to follow Medicare Important Message given?  YES (If response is "NO", the following Medicare IM given date fields will be blank) Date Medicare IM given:  07/12/2014 Medicare IM given by:  Eaton Folmar Date Additional Medicare IM given:   Additional Medicare IM given by:    Discharge Disposition:    Per UR Regulation:    If discussed at Long Length of Stay Meetings, dates discussed:    Comments:

## 2014-07-14 LAB — CULTURE, BLOOD (ROUTINE X 2)
CULTURE: NO GROWTH
Culture: NO GROWTH

## 2014-09-07 ENCOUNTER — Ambulatory Visit (INDEPENDENT_AMBULATORY_CARE_PROVIDER_SITE_OTHER): Payer: Medicare Other | Admitting: Family

## 2014-09-07 ENCOUNTER — Encounter: Payer: Self-pay | Admitting: Family

## 2014-09-07 VITALS — BP 130/100 | HR 83 | Temp 97.6°F | Ht 74.5 in | Wt 278.8 lb

## 2014-09-07 DIAGNOSIS — I10 Essential (primary) hypertension: Secondary | ICD-10-CM

## 2014-09-07 DIAGNOSIS — J4520 Mild intermittent asthma, uncomplicated: Secondary | ICD-10-CM

## 2014-09-07 DIAGNOSIS — Z0000 Encounter for general adult medical examination without abnormal findings: Secondary | ICD-10-CM

## 2014-09-07 MED ORDER — METOPROLOL TARTRATE 25 MG PO TABS: 25.0000 mg | ORAL_TABLET | Freq: Two times a day (BID) | ORAL | Status: DC

## 2014-09-07 MED ORDER — ALBUTEROL SULFATE 108 (90 BASE) MCG/ACT IN AEPB: 1.0000 | INHALATION_SPRAY | RESPIRATORY_TRACT | Status: DC | PRN

## 2014-09-07 NOTE — Progress Notes (Signed)
Subjective:    Patient ID: Roberto Horne, male    DOB: 06/17/1961, 54 y.o.   MRN: [id number]  Chief Complaint  Patient presents with  . Annual Exam  . Establish Care    HPI:  Roberto Horne is a 54 y.o. male who presents today for an annual wellness visit.   1) Health Maintenance -   Diet - 3 meals per day; fruits, salads, wraps; 1-2 caffeine   Exercise - Walks most days of the week.   2) Preventative Exams / Immunizations:  Dental -- Due for exam  Vision -- Due for exam   Health Maintenance  Topic Date Due  . TETANUS/TDAP  04/17/1979  . COLONOSCOPY  04/16/2010  . INFLUENZA VACCINE  01/24/2015  . HIV Screening  Completed     Immunization History  Administered Date(s) Administered  . Influenza,inj,Quad PF,36+ Mos 07/09/2014  . Pneumococcal Polysaccharide-23 07/09/2014     RISK FACTORS  Tobacco History  Smoking status  . Never Smoker   Smokeless tobacco  . Never Used     Cardiac risk factors: hypertension, male gender and obesity (BMI >= 30 kg/m2).  Depression Screen  Q1: Over the past two weeks, have you felt down, depressed or hopeless? No  Q2: Over the past two weeks, have you felt little interest or pleasure in doing things? No  Have you lost interest or pleasure in daily life? No  Do you often feel hopeless? No  Do you cry easily over simple problems? No  Activities of Daily Living In your present state of health, do you have any difficulty performing the following activities?:  Driving? No Managing money?  Yes - mentally challenged Feeding yourself? No Getting from bed to chair? No Climbing a flight of stairs? No Preparing food and eating?: No Bathing or showering? No Getting dressed: No Getting to the toilet? No Using the toilet: No Moving around from place to place: No In the past year have you fallen or had a near fall?:No   Home Safety Has smoke detector and wears seat belts. No firearms. No excess sun exposure. Are there  smokers in your home (other than you)?  No Do you feel safe at home?  Yes  Hearing Difficulties: No Do you often ask people to speak up or repeat themselves? No Do you experience ringing or noises in your ears? No  Do you have difficulty understanding soft or whispered voices? No    Cognitive Testing  Alert? Yes   Normal Appearance? Yes  Oriented to person? Yes  Place? Yes   Time? Yes  Recall of three objects?  Yes  Can perform simple calculations? Yes  Displays appropriate judgment? Yes  Can read the correct time from a watch face? Yes  Do you feel that you have a problem with memory? No  Do you often misplace items? No   Advanced Directives have been discussed with the patient? No  Current Physicians/Providers and Suppliers  1. Terri Piedra, FNP-C - Primary Care  Indicate any recent Medical Services you may have received from other than [facility] providers in the past year (date may be approximate).  All answers were reviewed with the patient and necessary referrals were made:  Mauricio Po, Arnold Line   09/07/2014    Review of Systems  Constitutional: Denies fever, chills, fatigue, or significant weight gain/loss. HENT: Head: Denies headache or neck pain Ears: Denies changes in hearing, ringing in ears, earache, drainage Nose: Denies, stuffiness, itching, nosebleed, sinus pain Rhinorrea Throat: Denies  sore throat, hoarseness, dry mouth, sores, thrush Eyes: Denies loss/changes in vision, pain, redness, blurry/double vision, flashing lights Cardiovascular: Denies chest pain/discomfort, tightness, palpitations, shortness of breath with activity, difficulty lying down, swelling, sudden awakening with shortness of breath Respiratory: Denies shortness of breath, cough, sputum production, wheezing Gastrointestinal: Denies dysphasia, heartburn, change in appetite, nausea, change in bowel habits, rectal bleeding, constipation, diarrhea, yellow skin or eyes Genitourinary: Denies frequency,  urgency, burning/pain, blood in urine, incontinence, change in urinary strength. Musculoskeletal: Denies muscle/joint pain, stiffness, back pain, redness or swelling of joints, trauma Skin: Denies rashes, lumps, itching, dryness, color changes, or hair/nail changes Neurological: Denies dizziness, fainting, seizures, weakness, numbness, tingling, tremor Psychiatric - Denies nervousness, stress, depression or memory loss Endocrine: Denies heat or cold intolerance, sweating, frequent urination, excessive thirst, changes in appetite Hematologic: Denies ease of bruising or bleeding    Objective:     BP 130/100 mmHg  Pulse 83  Temp(Src) 97.6 F (36.4 C) (Oral)  Ht 6' 2.5" (1.892 m)  Wt 278 lb 12 oz (126.44 kg)  BMI 35.32 kg/m2  SpO2 97% Nursing note and vital signs reviewed.  Physical Exam  Constitutional: He is oriented to person, place, and time. He appears well-developed and well-nourished. No distress.  Cardiovascular: Normal rate, regular rhythm, normal heart sounds and intact distal pulses.   Pulmonary/Chest: Effort normal and breath sounds normal.  Neurological: He is alert and oriented to person, place, and time.  Skin: Skin is warm and dry.  Psychiatric: He has a normal mood and affect. His behavior is normal. Judgment and thought content normal.       Assessment & Plan:   During the course of the visit the patient was educated and counseled about appropriate screening and preventive services including:    Pneumococcal vaccine   Influenza vaccine  Td vaccine  Diabetes screening  Nutrition counseling   Diet review for nutrition referral? Yes ____  Not Indicated _X___   Patient Instructions (the written plan) was given to the patient.  Medicare Attestation I have personally reviewed: The patient's medical and social history Their use of alcohol, tobacco or illicit drugs Their current medications and supplements The patient's functional ability including  ADLs,fall risks, home safety risks, cognitive, and hearing and visual impairment Diet and physical activities Evidence for depression or mood disorders  The patient's weight, height, BMI,  have been recorded in the chart.  I have made referrals, counseling, and provided education to the patient based on review of the above and I have provided the patient with a written personalized care plan for preventive services.     Mauricio Po, [place]   09/07/2014

## 2014-09-07 NOTE — Assessment & Plan Note (Signed)
Asthma is somewhat controlled at current time. Patient did not have an albuterol inhaler. Pro-air prescription written and sample coupon provided to patient. Follow-up if increase in wheezing or shortness of breath.

## 2014-09-07 NOTE — Progress Notes (Signed)
Pre visit review using our clinic review tool, if applicable. No additional management support is needed unless otherwise documented below in the visit note. 

## 2014-09-07 NOTE — Patient Instructions (Signed)
Thank you for choosing [facility].  Summary/Instructions:  Your prescription(s) have been submitted to your pharmacy or been printed and provided for you. Please take as directed and contact our office if you believe you are having problem(s) with the medication(s) or have any questions.   If your symptoms worsen or fail to improve, please contact our office for further instruction, or in case of emergency go directly to the emergency room at the closest medical facility.    Hypertension Hypertension, commonly called high blood pressure, is when the force of blood pumping through your arteries is too strong. Your arteries are the blood vessels that carry blood from your heart throughout your body. A blood pressure reading consists of a higher number over a lower number, such as 110/72. The higher number (systolic) is the pressure inside your arteries when your heart pumps. The lower number (diastolic) is the pressure inside your arteries when your heart relaxes. Ideally you want your blood pressure below 120/80. Hypertension forces your heart to work harder to pump blood. Your arteries may become narrow or stiff. Having hypertension puts you at risk for heart disease, stroke, and other problems.  RISK FACTORS Some risk factors for high blood pressure are controllable. Others are not.  Risk factors you cannot control include:   Race. You may be at higher risk if you are African American.  Age. Risk increases with age.  Gender. Men are at higher risk than women before age 27 years. After age 100, women are at higher risk than men. Risk factors you can control include:  Not getting enough exercise or physical activity.  Being overweight.  Getting too much fat, sugar, calories, or salt in your diet.  Drinking too much alcohol. SIGNS AND SYMPTOMS Hypertension does not usually cause signs or symptoms. Extremely high blood pressure (hypertensive crisis) may cause headache, anxiety,  shortness of breath, and nosebleed. DIAGNOSIS  To check if you have hypertension, your health care provider will measure your blood pressure while you are seated, with your arm held at the level of your heart. It should be measured at least twice using the same arm. Certain conditions can cause a difference in blood pressure between your right and left arms. A blood pressure reading that is higher than normal on one occasion does not mean that you need treatment. If one blood pressure reading is high, ask your health care provider about having it checked again. TREATMENT  Treating high blood pressure includes making lifestyle changes and possibly taking medicine. Living a healthy lifestyle can help lower high blood pressure. You may need to change some of your habits. Lifestyle changes may include:  Following the DASH diet. This diet is high in fruits, vegetables, and whole grains. It is low in salt, red meat, and added sugars.  Getting at least 2 hours of brisk physical activity every week.  Losing weight if necessary.  Not smoking.  Limiting alcoholic beverages.  Learning ways to reduce stress. If lifestyle changes are not enough to get your blood pressure under control, your health care provider may prescribe medicine. You may need to take more than one. Work closely with your health care provider to understand the risks and benefits. HOME CARE INSTRUCTIONS  Have your blood pressure rechecked as directed by your health care provider.   Take medicines only as directed by your health care provider. Follow the directions carefully. Blood pressure medicines must be taken as prescribed. The medicine does not work as well when you  skip doses. Skipping doses also puts you at risk for problems.   Do not smoke.   Monitor your blood pressure at home as directed by your health care provider. SEEK MEDICAL CARE IF:   You think you are having a reaction to medicines taken.  You have  recurrent headaches or feel dizzy.  You have swelling in your ankles.  You have trouble with your vision. SEEK IMMEDIATE MEDICAL CARE IF:  You develop a severe headache or confusion.  You have unusual weakness, numbness, or feel faint.  You have severe chest or abdominal pain.  You vomit repeatedly.  You have trouble breathing. MAKE SURE YOU:   Understand these instructions.  Will watch your condition.  Will get help right away if you are not doing well or get worse. Document Released: 06/11/2005 Document Revised: 10/26/2013 Document Reviewed: 04/03/2013 [facility] Patient Information 2015 [place], [place]. This information is not intended to replace advice given to you by your health care provider. Make sure you discuss any questions you have with your health care provider.

## 2014-09-07 NOTE — Assessment & Plan Note (Signed)
Reviewed and updated patient's medical, surgical, family and social history. Medications and allergies were also reviewed. Basic screenings for depression, activities of daily living, hearing, cognition and safety were performed. Provider list was updated and health plan was provided to the patient. Note patient is due for a dental and vision and exam. He will schedule them independently.

## 2014-09-07 NOTE — Assessment & Plan Note (Signed)
Patient previously diagnosed with hypertension. Not currently maintained on any medications. Blood pressure today is elevated above goal of 140/90. Start metoprolol. Recommend continuing with lifestyle changes by increasing nutrient density and decreasing saturated fats in his diet while increasing physical activity to most days of the week. Follow-up in 2 weeks to determine blood pressure control.

## 2014-09-21 ENCOUNTER — Encounter: Payer: Self-pay | Admitting: Family

## 2014-09-21 ENCOUNTER — Ambulatory Visit (INDEPENDENT_AMBULATORY_CARE_PROVIDER_SITE_OTHER): Payer: Medicare Other | Admitting: Family

## 2014-09-21 VITALS — BP 122/88 | HR 67 | Temp 97.4°F | Resp 18 | Wt 278.1 lb

## 2014-09-21 DIAGNOSIS — I10 Essential (primary) hypertension: Secondary | ICD-10-CM | POA: Diagnosis not present

## 2014-09-21 DIAGNOSIS — J309 Allergic rhinitis, unspecified: Secondary | ICD-10-CM | POA: Diagnosis not present

## 2014-09-21 DIAGNOSIS — J3089 Other allergic rhinitis: Secondary | ICD-10-CM

## 2014-09-21 DIAGNOSIS — J302 Other seasonal allergic rhinitis: Secondary | ICD-10-CM | POA: Insufficient documentation

## 2014-09-21 MED ORDER — METOPROLOL TARTRATE 25 MG PO TABS: 25.0000 mg | ORAL_TABLET | Freq: Two times a day (BID) | ORAL | Status: DC

## 2014-09-21 NOTE — Progress Notes (Signed)
Pre visit review using our clinic review tool, if applicable. No additional management support is needed unless otherwise documented below in the visit note. 

## 2014-09-21 NOTE — Patient Instructions (Addendum)
Thank you for choosing [facility].  Summary/Instructions:  Your prescription(s) have been submitted to your pharmacy or been printed and provided for you. Please take as directed and contact our office if you believe you are having problem(s) with the medication(s) or have any questions.  If your symptoms worsen or fail to improve, please contact our office for further instruction, or in case of emergency go directly to the emergency room at the closest medical facility.   For your nose please try Claritin, Allegra or Zyrtec.  Continue to use the flonase.

## 2014-09-21 NOTE — Progress Notes (Signed)
   Subjective:    Patient ID: Roberto Horne, male    DOB: 07-07-1960, 54 y.o.   MRN: [id number]  Chief Complaint  Patient presents with  . Follow-up    BP medicine has been doing great    HPI:  Roberto Horne is a 54 y.o. male who presents today to follow up on his blood pressure.   1) Blood pressure -was recently started on metoprolol. Indicates that his blood pressure has been doing very good.    BP Readings from Last 3 Encounters:  09/21/14 122/88  09/07/14 130/100  07/12/14 104/71    2) Rhinorhea - Notes he continues to experience the associated symptom of a runny nose, watery eyes and sneezing has been going on for a couple of weeks. Currently taking flonase.   Allergies  Allergen Reactions  . Ibuprofen Itching    Current Outpatient Prescriptions on File Prior to Visit  Medication Sig Dispense Refill  . Albuterol Sulfate (PROAIR RESPICLICK) 678 (90 BASE) MCG/ACT AEPB Inhale 1-2 puffs into the lungs every 4 (four) hours as needed. 1 each 0  . fluticasone (FLONASE) 50 MCG/ACT nasal spray Place 1 spray into both nostrils daily. 16 g 1  . metoprolol tartrate (LOPRESSOR) 25 MG tablet Take 1 tablet (25 mg total) by mouth 2 (two) times daily. 60 tablet 1  . omeprazole (PRILOSEC) 20 MG capsule Take 20 mg by mouth daily.    . sodium chloride (OCEAN) 0.65 % SOLN nasal spray Place 1 spray into both nostrils 2 (two) times daily.     No current facility-administered medications on file prior to visit.    Review of Systems  HENT: Positive for rhinorrhea and sneezing.   Eyes: Negative for discharge.  Respiratory: Negative for cough, chest tightness and shortness of breath.   Cardiovascular: Negative for chest pain, palpitations and leg swelling.      Objective:    BP 122/88 mmHg  Pulse 67  Temp(Src) 97.4 F (36.3 C) (Oral)  Resp 18  Wt 278 lb 1.9 oz (126.154 kg)  SpO2 97% Nursing note and vital signs reviewed.  Physical Exam  Constitutional: He is oriented to  person, place, and time. He appears well-developed and well-nourished. No distress.  HENT:  Right Ear: Hearing, tympanic membrane, external ear and ear canal normal.  Left Ear: Hearing, tympanic membrane, external ear and ear canal normal.  Nose: Rhinorrhea present. Right sinus exhibits no maxillary sinus tenderness and no frontal sinus tenderness. Left sinus exhibits no maxillary sinus tenderness and no frontal sinus tenderness.  Mouth/Throat: Uvula is midline, oropharynx is clear and moist and mucous membranes are normal.  Cardiovascular: Normal rate, regular rhythm, normal heart sounds and intact distal pulses.   Pulmonary/Chest: Effort normal and breath sounds normal.  Neurological: He is alert and oriented to person, place, and time.  Skin: Skin is warm and dry.  Psychiatric: He has a normal mood and affect. His behavior is normal. Judgment and thought content normal.       Assessment & Plan:

## 2014-09-21 NOTE — Assessment & Plan Note (Signed)
Blood pressure has improved since previous visit and is stable and under goal of 140/90. Continue current metoprolol at current dosage. Follow-up in 6 months or sooner if needed.

## 2014-09-21 NOTE — Assessment & Plan Note (Signed)
Symptoms and exam consistent with allergic rhinitis. Continue over-the-counter Flonase. Start over-the-counter Claritin, Zyrtec or Allegra. Continue other over-the-counter medications as needed for symptom relief. If symptoms are not relieved this regimen, consider adding montelukast to the regimen to help with allergies.

## 2014-10-15 ENCOUNTER — Telehealth: Payer: Self-pay | Admitting: Family

## 2014-10-15 NOTE — Telephone Encounter (Signed)
Roberto Horne is calling to follow up on not hearing back. Advised that a stomach issue is difficult to assess without being seen. Scheduled for Saturday clinic @ 10 AM

## 2014-10-15 NOTE — Telephone Encounter (Signed)
Patient Name: Roberto Horne DOB: Dec 28, 1960 Initial Comment Caller states her brother in law started vomiting last night. Nurse Assessment Guidelines Guideline Title Affirmed Question Affirmed Notes Final Disposition User FINAL ATTEMPT MADE - no message left [place], Therapist, sports, Kermit Balo

## 2014-10-16 ENCOUNTER — Ambulatory Visit: Payer: Medicare Other | Admitting: Family Medicine

## 2014-10-18 ENCOUNTER — Telehealth: Payer: Self-pay | Admitting: Family

## 2014-10-18 NOTE — Telephone Encounter (Signed)
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  Is requesting a letter stating his disability(mentally disable) that would not allow him to appear.

## 2014-10-18 NOTE — Telephone Encounter (Signed)
There is no evidence of mental disability in his chart that would support such a request.

## 2014-10-21 NOTE — Telephone Encounter (Signed)
Pt aware.

## 2014-11-17 ENCOUNTER — Other Ambulatory Visit: Payer: Self-pay | Admitting: Family

## 2014-12-16 ENCOUNTER — Encounter: Payer: Self-pay | Admitting: Family

## 2014-12-16 ENCOUNTER — Ambulatory Visit (INDEPENDENT_AMBULATORY_CARE_PROVIDER_SITE_OTHER): Payer: Medicare Other | Admitting: Family

## 2014-12-16 VITALS — BP 150/90 | HR 69 | Resp 18 | Ht 74.5 in | Wt 280.1 lb

## 2014-12-16 DIAGNOSIS — G4730 Sleep apnea, unspecified: Secondary | ICD-10-CM

## 2014-12-16 DIAGNOSIS — G4733 Obstructive sleep apnea (adult) (pediatric): Secondary | ICD-10-CM | POA: Insufficient documentation

## 2014-12-16 DIAGNOSIS — I10 Essential (primary) hypertension: Secondary | ICD-10-CM

## 2014-12-16 DIAGNOSIS — J302 Other seasonal allergic rhinitis: Secondary | ICD-10-CM

## 2014-12-16 NOTE — Progress Notes (Signed)
Subjective:    Patient ID: Roberto Horne, male    DOB: [birth year], 54 y.o.   MRN: [id number]  Chief Complaint  Patient presents with  . Follow-up    hypertension, states that his bp has been alot better    HPI:  Roberto Horne is a 54 y.o. male with a PMH of hypertension, pneumonia, reflux, hyperlipidemia, and gout who presents today for a follow-up office visit.  1.) Hypertension - Previously seen in the office with hypertension and indicates that his blood pressures have been maintained well at home with the metoprolol. Notes that there have been some problems at home which is elevating his stress and increasing his blood pressure. Takes his medication as prescribed and denies adverse side effects.   BP Readings from Last 3 Encounters:  12/16/14 150/90  09/21/14 122/88  09/07/14 130/100    2.) Seasonal allergies -Currently maintained on claritin and flonase and notes that his symptoms of congestion have been improved when he takes the medication.   3.) Sleep apnea - Previous history of sleep apnea. Would like to be referred to sleep medicine for further evaluation.  Allergies  Allergen Reactions  . Ibuprofen Itching    Current Outpatient Prescriptions on File Prior to Visit  Medication Sig Dispense Refill  . fluticasone (FLONASE) 50 MCG/ACT nasal spray Place 1 spray into both nostrils daily. 16 g 1  . metoprolol tartrate (LOPRESSOR) 25 MG tablet Take 1 tablet (25 mg total) by mouth 2 (two) times daily. 180 tablet 1  . omeprazole (PRILOSEC) 20 MG capsule Take 20 mg by mouth daily.    Marland Kitchen PROAIR RESPICLICK 732 (90 BASE) MCG/ACT AEPB INHALE ONE TO TWO PUFFS BY MOUTH INTO THE LUNGS EVERY 4 HOURS AS NEEDED 1 each 2  . sodium chloride (OCEAN) 0.65 % SOLN nasal spray Place 1 spray into both nostrils 2 (two) times daily.     No current facility-administered medications on file prior to visit.    Past Medical History  Diagnosis Date  . Hypertension   . Asthma   . Hyperlipemia    . Gout   . Arthritis   . Depression   . GERD (gastroesophageal reflux disease)   . Allergy      Review of Systems  Eyes:       Negative for changes to vision.   Respiratory: Negative for cough and chest tightness.   Cardiovascular: Negative for chest pain, palpitations and leg swelling.  Neurological: Negative for headaches.      Objective:    BP 150/90 mmHg  Pulse 69  Resp 18  Ht 6' 2.5" (1.892 m)  Wt 280 lb 1.9 oz (127.062 kg)  BMI 35.50 kg/m2  SpO2 99% Nursing note and vital signs reviewed.  Physical Exam  Constitutional: He is oriented to person, place, and time. He appears well-developed and well-nourished. No distress.  HENT:  Right Ear: Hearing, tympanic membrane, external ear and ear canal normal.  Left Ear: Hearing, tympanic membrane, external ear and ear canal normal.  Nose: Right sinus exhibits no maxillary sinus tenderness and no frontal sinus tenderness. Left sinus exhibits no maxillary sinus tenderness and no frontal sinus tenderness.  Mouth/Throat: Uvula is midline, oropharynx is clear and moist and mucous membranes are normal.  Cardiovascular: Normal rate, regular rhythm, normal heart sounds and intact distal pulses.   Pulmonary/Chest: Effort normal and breath sounds normal.  Neurological: He is alert and oriented to person, place, and time.  Skin: Skin is warm and dry.  Psychiatric:  He has a normal mood and affect. His behavior is normal. Judgment and thought content normal.       Assessment & Plan:   Problem List Items Addressed This Visit      Cardiovascular and Mediastinum   HTN (hypertension) - Primary    Blood pressure slightly elevated today 150/90. Notes that his average blood pressure at home is below 140/90. Continue current dosage of metoprolol. Continue to monitor blood pressures at home. Follow-up in 6 months.        Respiratory   Seasonal allergies    Seasonal allergies are improved with the addition of Zyrtec. Does continue to  have mild congestion. Continue current dosages Zyrtec and Flonase for seasonal allergies.        Other   Sleep apnea    Previously maintained on CPAP. Refer Sleep Medicine for further management.       Relevant Orders   Ambulatory referral to Pulmonology

## 2014-12-16 NOTE — Assessment & Plan Note (Signed)
Blood pressure slightly elevated today 150/90. Notes that his average blood pressure at home is below 140/90. Continue current dosage of metoprolol. Continue to monitor blood pressures at home. Follow-up in 6 months.

## 2014-12-16 NOTE — Assessment & Plan Note (Signed)
Seasonal allergies are improved with the addition of Zyrtec. Does continue to have mild congestion. Continue current dosages Zyrtec and Flonase for seasonal allergies.

## 2014-12-16 NOTE — Assessment & Plan Note (Signed)
Previously maintained on CPAP. Refer Sleep Medicine for further management.

## 2014-12-16 NOTE — Progress Notes (Signed)
Pre visit review using our clinic review tool, if applicable. No additional management support is needed unless otherwise documented below in the visit note. 

## 2014-12-16 NOTE — Patient Instructions (Signed)
Thank you for choosing [facility].  Summary/Instructions:  Referrals have been made during this visit. You should expect to hear back from our schedulers in about 7-10 days in regards to establishing an appointment with the specialists we discussed.   Please continue to take your medication as prescribed.   Follow up with sleep medicine for your CPAP needs.

## 2015-01-25 NOTE — Telephone Encounter (Signed)
Patients sister has called regarding the encounter below and she contacted all mental health facilities all over [place] and [place].  One of them suggested to get back in touch with his PCP to get the information from social security office.  I did give her the number to medical records to see about getting the records here. The court date has been moved to August.

## 2015-02-23 ENCOUNTER — Telehealth: Payer: Self-pay | Admitting: Family

## 2015-02-23 NOTE — Telephone Encounter (Signed)
Patient would like to know if you received his medical records, please advise

## 2015-03-01 ENCOUNTER — Ambulatory Visit (INDEPENDENT_AMBULATORY_CARE_PROVIDER_SITE_OTHER): Payer: Medicare Other | Admitting: Family

## 2015-03-01 ENCOUNTER — Encounter: Payer: Self-pay | Admitting: Family

## 2015-03-01 VITALS — BP 142/94 | HR 71 | Temp 98.1°F | Resp 18 | Ht 74.5 in | Wt 283.0 lb

## 2015-03-01 DIAGNOSIS — Z23 Encounter for immunization: Secondary | ICD-10-CM | POA: Diagnosis not present

## 2015-03-01 DIAGNOSIS — S40021A Contusion of right upper arm, initial encounter: Secondary | ICD-10-CM | POA: Diagnosis not present

## 2015-03-01 DIAGNOSIS — S40029A Contusion of unspecified upper arm, initial encounter: Secondary | ICD-10-CM | POA: Insufficient documentation

## 2015-03-01 DIAGNOSIS — S60222A Contusion of left hand, initial encounter: Secondary | ICD-10-CM | POA: Diagnosis not present

## 2015-03-01 DIAGNOSIS — S60229A Contusion of unspecified hand, initial encounter: Secondary | ICD-10-CM | POA: Insufficient documentation

## 2015-03-01 NOTE — Assessment & Plan Note (Signed)
Symptoms and exam are consistent with right upper arm contusion. Treat conservatively with over-the-counter medications as needed for discomfort, heat, and stretching exercises as given and after visit summary. Follow-up if symptoms worsen or fail to improve.

## 2015-03-01 NOTE — Telephone Encounter (Signed)
Have not seen medical records.

## 2015-03-01 NOTE — Assessment & Plan Note (Signed)
Symptoms and exam consistent with an contusion possible jamming of the fingers. Treat conservatively at this time with over-the-counter medications as needed for symptom relief, heat, and therapeutic exercises as provided in the after visit summary. Follow-up if symptoms worsen or fail to improve.

## 2015-03-01 NOTE — Patient Instructions (Signed)
Thank you for choosing [facility].  Summary/Instructions:  If your symptoms worsen or fail to improve, please contact our office for further instruction, or in case of emergency go directly to the emergency room at the closest medical facility.    Treat conservatively with ice and heat as needed. Stretch and exercise. Follow up if symptoms worsen or do not improve.   Biceps Tendon Tendinitis (Distal) with Rehab Tendinitis involves inflammation and pain over the affected tendon. The distal biceps tendon (near the elbow) is vulnerable to tendinitis. Distal biceps tendonitis is usually due to the bony bump near the elbow (bicipital tuberosity) causing increased friction over the tendon. The biceps tendon attaches the biceps muscle to one bone in the elbow and two in the shoulder. It is important for proper function of the elbow and for turning the palm upward (supination). SYMPTOMS   Pain, aching, tenderness, and sometimes warmth or redness over the front of the elbow.  Pain when bending the elbow or turning the palm up, using the wrist, especially if performed against resistance.  Crackling sound (crepitation) when the tendon or elbow is moved or touched. CAUSES  The symptoms of biceps tendonitis are due to inflammation of the tendon. Inflammation may be caused by:  Strain from sudden increase in amount or intensity of activity.  Direct blow or injury to the elbow (uncommon).  Overuse or repetitive elbow bending or wrist rotation, particularly when turning the palm up, or with elbow hyperextension. RISK INCREASES WITH:  Sports that involve contact or overhead arm activity (throwing sports, gymnastics, weightlifting, bodybuilding, rock climbing).  Heavy labor.  Poor strength and flexibility.  Failure to warm up properly before activity.  Injury to other structures of the elbow.  Restraint of the elbow. PREVENTION  Warm up and stretch properly before activity.  Allow  time for recovery between activities.  Maintain physical fitness:  Strength, flexibility, and endurance.  Cardiovascular fitness.  Learn and use proper exercise technique. PROGNOSIS  With proper treatment, biceps tendon tendonitis is usually curable within 6 weeks.  RELATED COMPLICATIONS   Longer healing time if not properly treated or if not given enough time to heal.  Chronically inflamed tendon that causes persistent pain with activity, that may progress to constant pain and potentially rupture of the tendon.  Recurring symptoms, especially if activity is resumed too soon, with overuse or with poor technique. TREATMENT  Treatment first involves ice and medicine to reduce pain and inflammation. Modify activities that cause pain, to reduce the chances of causing the condition to get worse. Strengthening and stretching exercises should be performed to promote proper use of the muscles of the elbow. These exercise may be performed at home or with a therapist. Other treatments may be given such as ultrasound or heat therapy. Surgery is usually not recommended.  MEDICATION  If pain medicine is needed, nonsteroidal anti-inflammatory medicines (aspirin and ibuprofen), or other minor pain relievers (acetaminophen), are often advised.  Do not take pain relieving medication for 7 days before surgery.  Prescription pain relievers may be given if your caregiver thinks they are needed. Use only as directed and only as much as you need. HEAT AND COLD:   Cold treatment (icing) should be applied for 10 to 15 minutes every 2 to 3 hours for inflammation and pain, and immediately after activity that aggravates your symptoms. Use ice packs or an ice massage.  Heat treatment may be used before performing stretching and strengthening activities prescribed by your caregiver, physical therapist,  or Product/process development scientist. Use a heat pack or a warm water soak. SEEK MEDICAL CARE IF:   Symptoms get worse or do  not improve in 2 weeks, despite treatment.  New, unexplained symptoms develop. (Drugs used in treatment may produce side effects.) EXERCISES  RANGE OF MOTION (ROM) AND STRETCHING EXERCISES - Biceps Tendon Tendinitis (Distal) These exercises may help you when beginning to rehabilitate your injury. Your symptoms may go away with or without further involvement from your physician, physical therapist, or athletic trainer. While completing these exercises, remember:   Restoring tissue flexibility helps normal motion to return to the joints. This allows healthier, less painful movement and activity.  An effective stretch should be held for at least 30 seconds.  A stretch should never be painful. You should only feel a gentle lengthening or release in the stretched tissue. STRETCH - Elbow Flexors   Lie on a firm bed or countertop on your back. Be sure that you are in a comfortable position which will allow you to relax your arm muscles.  Place a folded towel under your right / left upper arm, so that your elbow and shoulder are at the same height. Extend your arm; your elbow should not rest on the bed or towel.  Allow the weight of your hand to straighten your elbow. Keep your arm and chest muscles relaxed. Your caretaker may ask you to increase the intensity of your stretch by adding a small wrist or hand weight.  Hold for __________ seconds. You should feel a stretch on the inside of your elbow. Slowly return to the starting position. Repeat __________ times. Complete this exercise __________ times per day. RANGE OF MOTION - Supination, Active   Stand or sit with your elbows at your side. Bend your right / left elbow to 90 degrees.  Turn your palm upward until you feel a gentle stretch on the inside of your forearm.  Hold this position for __________ seconds. Slowly release and return to the starting position. Repeat __________ times. Complete this stretch __________ times per day.  RANGE OF  MOTION - Pronation, Active   Stand or sit with your elbows at your side. Bend your right / left elbow to 90 degrees.  Turn your palm downward until you feel a gentle stretch on the top of your forearm.  Hold this position for __________ seconds. Slowly release and return to the starting position. Repeat __________ times. Complete this stretch __________ times per day.  STRENGTHENING EXERCISES - Biceps Tendon Tendinitis (Distal) These exercises may help you when beginning to rehabilitate your injury. They may resolve your symptoms with or without further involvement from your physician, physical therapist or athletic trainer. While completing these exercises, remember:   Muscles can gain both the endurance and the strength needed for everyday activities through controlled exercises.  Complete these exercises as instructed by your physician, physical therapist or athletic trainer. Increase the resistance and repetitions only as guided.  You may experience muscle soreness or fatigue, but the pain or discomfort you are trying to eliminate should never get worse during these exercises. If this pain does get worse, stop and make sure you are following the directions exactly. If the pain is still present after adjustments, discontinue the exercise until you can discuss the trouble with your clinician. STRENGTH - Elbow Flexors, Isometric   Stand or sit upright on a firm surface. Place your right / left arm so that your hand is palm-up and at the height of your waist.  Place your opposite hand on top of your forearm. Gently push down as your right / left arm resists. Push as hard as you can with both arms without causing any pain or movement at your right / left elbow. Hold this stationary position for __________ seconds.  Gradually release the tension in both arms. Allow your muscles to relax completely before repeating. Repeat __________ times. Complete this exercise __________ times per  day. STRENGTH - Forearm Supinators   Sit with your right / left forearm supported on a table, keeping your elbow below shoulder height. Rest your hand over the edge, palm down.  Gently grip a hammer or a soup ladle.  Without moving your elbow, slowly turn your palm and hand upward to a "thumbs-up" position.  Hold this position for __________ seconds. Slowly return to the starting position. Repeat __________ times. Complete this exercise __________ times per day.  STRENGTH - Forearm Pronators   Sit with your right / left forearm supported on a table, keeping your elbow below shoulder height. Rest your hand over the edge, palm up.  Gently grip a hammer or a soup ladle.  Without moving your elbow, slowly turn your palm and hand upward to a "thumbs-up" position.  Hold this position for __________ seconds. Slowly return to the starting position. Repeat __________ times. Complete this exercise __________ times per day.  STRENGTH - Elbow Flexors, Supinated  With good posture, stand or sit on a firm chair without armrests. Allow your right / left arm to rest at your side with your palm facing forward.  Holding a __________ weight, or gripping a rubber exercise band or tubing, bring your hand toward your shoulder.  Allow your muscles to control the resistance as your hand returns to your side. Repeat __________ times. Complete this exercise __________ times per day.  STRENGTH - Elbow Flexors, Neutral  With good posture, stand or sit on a firm chair without armrests. Allow your right / left arm to rest at your side with your thumb facing forward. Holding a __________ weight, or gripping a rubber exercise band or tubing, bring your hand toward your shoulder.  Allow your muscles to control the resistance as your hand returns to your side. Repeat __________ times. Complete this exercise __________ times per day.  Document Released: 06/11/2005 Document Revised: 09/03/2011 Document Reviewed:  09/23/2008 [facility] Patient Information 2015 [place], [place]. This information is not intended to replace advice given to you by your health care provider. Make sure you discuss any questions you have with your health care provider.  Hand Hematoma A hematoma is an area of pooled blood outside the blood vessels. This often occurs in a small space on the back of the hand. It results from a direct hit (trauma) to the area. Hematomas just beneath the skin are called bruises. When blood vessels are hurt, they cause bleeding in the surrounding space, which results in a "black and blue" look. SYMPTOMS   Swelling over the injury site.  Pain and tenderness over the affected area.  Fluid-like feeling, often firm, bump or area of swelling (fluctuance).  Redness that goes through several color changes before it heals completely. (Redness to black and blue or purple, then green-yellow, then yellow.) CAUSES  Hand hematomas in sports are often caused by a direct blow to the hand. This often comes from a blunt object, such as another player, the player's equipment (helmet), or the playing surface (hockey sideboard or artificial turf). Bleeding into the tissue causes the surrounding tissue  to be pushed away.  RISK INCREASES WITH:  Contact or collision sports (i.e. football, hockey, lacrosse, rugby).  Not protecting exposed areas during contact or collision sports.  Bleeding disorder or use of anticoagulants (blood thinners), aspirin, or non-steroidal anti-inflammatory medicines (aspirin, ibuprofen). PREVENTION   Wear proper protective equipment and ensure correct fit (i.e. hand pads for contact sports).  Limit the use of blood thinners, aspirin, and ibuprofen.  Wear tape, pads, a splint, or a cast to prevent re-injury, if play is resumed before healing is complete. PROGNOSIS  Hand hematomas often subside in 2 to 8 weeks. Healing is quicker, if the blood is withdrawn by a needle (aspiration).  RELATED  COMPLICATIONS   Too much bleeding, leading to longer impairment.  Infection (uncommon).  Hand stiffness.  Delayed healing, especially if activity is resumed too soon.  Recurring injury.  Calcium deposits in the blood left in the hematoma, if blood has not been fully removed or absorbed. TREATMENT  Treatment first consists of ice, medicine, and pressure, to reduce pain and inflammation. Heat, massage, nonsteroidal anti-inflammatory medicines, and intense physical therapy are often delayed for at least 48 hours. For severe hematomas, you may be referred to a physical therapist or athletic trainer for further evaluation and treatment. Sometimes, withdrawal of the hematoma by a needle is performed, to speed up healing. Needle withdrawal should be preformed by a medically trained person. For return to play, pad the hand to reduce the likelihood of re-injury. Do not massage the hand while it is healing. This may trigger bleeding again.  MEDICATION   If pain medicine is needed, nonsteroidal anti-inflammatory medicines (aspirin and ibuprofen), or other minor pain relievers (acetaminophen), are often advised.  Do not take pain medicine for 7 days before surgery.  Stronger pain relievers may be prescribed. Use only as directed and only as much as you need. HEAT AND COLD  Cold treatment (icing) relieves pain and reduces inflammation. Cold treatment should be applied for 10 to 15 minutes every 2 to 3 hours, and immediately after activity that aggravates your symptoms. Use ice packs or an ice massage.  Heat treatment may be used prior to performing stretching and strengthening activities prescribed by your caregiver, physical therapist, or athletic trainer. Use a heat pack or a warm water soak. SEEK MEDICAL CARE IF:   Symptoms get worse or do not improve in 2 weeks, despite treatment.  The skin is broken and you have signs of infection (drainage of fluids, increasing pain, fever).  New,  unexplained symptoms develop. (Drugs used in treatment may produce side effects.) Document Released: 06/11/2005 Document Revised: 09/03/2011 Document Reviewed: 09/23/2008 [facility] Patient Information 2015 [place], [place]. This information is not intended to replace advice given to you by your health care provider. Make sure you discuss any questions you have with your health care provider.

## 2015-03-01 NOTE — Progress Notes (Signed)
Pre visit review using our clinic review tool, if applicable. No additional management support is needed unless otherwise documented below in the visit note. 

## 2015-03-01 NOTE — Progress Notes (Signed)
Subjective:    Patient ID: Roberto Horne, male    DOB: June 28, 1960, 54 y.o.   MRN: [id number]  Chief Complaint  Patient presents with  . Hand Pain    states that he has hand and arm pain on his right side just his hand hurts, hurts worse at night    HPI:  Roberto Horne is a 54 y.o. male with a PMH of hypertension, seasonal allergies, hyperlipidemia, gout, and sleep apnea who presents today for an acute office visit.  1.) Hand pain - This is a new problem. Associated symptom of pain located on around his left hand which has been going on for about 3 weeks. Pain is described as achy and waxes and wanes. Modifying factors include Tylenol and ibuprofen which help a little. No sliding into a base during a softball game when this started to hurt.   2.) Arm pain - This is a new problem. Associated symptom of pain located in his right arm described as sharp and burning has been going on for about 3 weeks. Timing of the symptoms is worse at night. Modifying factors include Tylenol and ibuprofen which did help some. Reports he was sliding in softball when this started to hurt.   Allergies  Allergen Reactions  . Ibuprofen Itching    Current Outpatient Prescriptions on File Prior to Visit  Medication Sig Dispense Refill  . fluticasone (FLONASE) 50 MCG/ACT nasal spray Place 1 spray into both nostrils daily. 16 g 1  . metoprolol tartrate (LOPRESSOR) 25 MG tablet Take 1 tablet (25 mg total) by mouth 2 (two) times daily. 180 tablet 1  . omeprazole (PRILOSEC) 20 MG capsule Take 20 mg by mouth daily.    Marland Kitchen PROAIR RESPICLICK 295 (90 BASE) MCG/ACT AEPB INHALE ONE TO TWO PUFFS BY MOUTH INTO THE LUNGS EVERY 4 HOURS AS NEEDED 1 each 2  . sodium chloride (OCEAN) 0.65 % SOLN nasal spray Place 1 spray into both nostrils 2 (two) times daily.     No current facility-administered medications on file prior to visit.    Review of Systems  Constitutional: Negative for fever and chills.  Musculoskeletal:    Positive for right arm and left hand pain  Neurological: Negative for weakness and numbness.      Objective:    BP 142/94 mmHg  Pulse 71  Temp(Src) 98.1 F (36.7 C) (Oral)  Resp 18  Ht 6' 2.5" (1.892 m)  Wt 283 lb (128.368 kg)  BMI 35.86 kg/m2  SpO2 97% Nursing note and vital signs reviewed.  Physical Exam  Constitutional: He is oriented to person, place, and time. He appears well-developed and well-nourished. No distress.  Cardiovascular: Normal rate, regular rhythm, normal heart sounds and intact distal pulses.   Pulmonary/Chest: Effort normal and breath sounds normal.  Musculoskeletal:  Right arm - no obvious deformity, discoloration, or edema noted. Tenderness elicited over the medial biceps/triceps. No deformities noted. Strength and range of motion are normal. Distal pulses and sensation are intact and appropriate.  Left hand - no obvious deformity, discoloration, or edema noted. Mild tenderness elicited along the MCP joints of the second, third, and fourth fingers. Grip strength is normal. Range of motion is intact and appropriate. Pulses are intact and appropriate. Capillary refill is present.  Neurological: He is alert and oriented to person, place, and time.  Skin: Skin is warm and dry.  Psychiatric: He has a normal mood and affect. His behavior is normal. Judgment and thought content normal.  Assessment & Plan:   Problem List Items Addressed This Visit      Other   Contusion, arm, upper    Symptoms and exam are consistent with right upper arm contusion. Treat conservatively with over-the-counter medications as needed for discomfort, heat, and stretching exercises as given and after visit summary. Follow-up if symptoms worsen or fail to improve.      Hand contusion    Symptoms and exam consistent with an contusion possible jamming of the fingers. Treat conservatively at this time with over-the-counter medications as needed for symptom relief, heat, and  therapeutic exercises as provided in the after visit summary. Follow-up if symptoms worsen or fail to improve.       Other Visit Diagnoses    Encounter for immunization    -  Primary

## 2015-03-24 ENCOUNTER — Institutional Professional Consult (permissible substitution): Payer: Medicare Other | Admitting: Internal Medicine

## 2015-06-15 ENCOUNTER — Other Ambulatory Visit: Payer: Self-pay | Admitting: Family

## 2015-07-21 ENCOUNTER — Encounter: Payer: Self-pay | Admitting: Internal Medicine

## 2015-07-21 ENCOUNTER — Other Ambulatory Visit (INDEPENDENT_AMBULATORY_CARE_PROVIDER_SITE_OTHER): Payer: Medicare Other

## 2015-07-21 ENCOUNTER — Ambulatory Visit (INDEPENDENT_AMBULATORY_CARE_PROVIDER_SITE_OTHER): Payer: Medicare Other | Admitting: Internal Medicine

## 2015-07-21 VITALS — BP 136/80 | HR 59 | Ht 74.0 in | Wt 302.2 lb

## 2015-07-21 DIAGNOSIS — J4530 Mild persistent asthma, uncomplicated: Secondary | ICD-10-CM

## 2015-07-21 DIAGNOSIS — J302 Other seasonal allergic rhinitis: Secondary | ICD-10-CM

## 2015-07-21 DIAGNOSIS — G4733 Obstructive sleep apnea (adult) (pediatric): Secondary | ICD-10-CM

## 2015-07-21 DIAGNOSIS — J309 Allergic rhinitis, unspecified: Secondary | ICD-10-CM | POA: Diagnosis not present

## 2015-07-21 DIAGNOSIS — J339 Nasal polyp, unspecified: Secondary | ICD-10-CM | POA: Diagnosis not present

## 2015-07-21 DIAGNOSIS — J3089 Other allergic rhinitis: Secondary | ICD-10-CM

## 2015-07-21 LAB — CBC WITH DIFFERENTIAL/PLATELET
Basophils Absolute: 0 10*3/uL (ref 0.0–0.1)
Basophils Relative: 0.3 % (ref 0.0–3.0)
EOS PCT: 1.7 % (ref 0.0–5.0)
Eosinophils Absolute: 0.1 10*3/uL (ref 0.0–0.7)
HEMATOCRIT: 41.5 % (ref 39.0–52.0)
HEMOGLOBIN: 13 g/dL (ref 13.0–17.0)
LYMPHS PCT: 36.4 % (ref 12.0–46.0)
Lymphs Abs: 3 10*3/uL (ref 0.7–4.0)
MCHC: 31.3 g/dL (ref 30.0–36.0)
MCV: 76.4 fl — ABNORMAL LOW (ref 78.0–100.0)
Monocytes Absolute: 0.8 10*3/uL (ref 0.1–1.0)
Monocytes Relative: 9.8 % (ref 3.0–12.0)
Neutro Abs: 4.2 10*3/uL (ref 1.4–7.7)
Neutrophils Relative %: 51.8 % (ref 43.0–77.0)
Platelets: 219 10*3/uL (ref 150.0–400.0)
RBC: 5.44 Mil/uL (ref 4.22–5.81)
RDW: 15.3 % (ref 11.5–15.5)
WBC: 8.2 10*3/uL (ref 4.0–10.5)

## 2015-07-21 NOTE — Assessment & Plan Note (Signed)
Perennial symptoms are complicated by presence of nasal polyps.   plan-continue Flonase after trial of Dymista, Zyrtec

## 2015-07-21 NOTE — Patient Instructions (Signed)
Order- lab- CBC with diff, Allergy profile,    Order- schedule limited CT max fac    Dx nasal polyps  Sample Dymista nasal spray    1-2 puffs each nostril twice daily, morning and night               When you run out of the Dymista sample, go back to using Flonase 2 puffs each nostril twice daily  We will look later at re-testing for sleep apnea

## 2015-07-21 NOTE — Assessment & Plan Note (Signed)
Significant contribution to nasal congestion complaints. He does not use aspirin but is not aware of any aspirin intolerance, preferring Tylenol. Plan-try sample Dymista for comparison, then returned of regular use of Flonase twice a day. CT sinuses to determine severity of more central polyps.

## 2015-07-21 NOTE — Assessment & Plan Note (Signed)
Occasional use of rescue inhaler appears to be sufficient, without sleep disturbance. Currently uncomplicated.

## 2015-07-21 NOTE — Progress Notes (Signed)
07/21/2015-55 year old male never smoker referred courtesy of Mauricio Po; sleep study done 1983 at [facility] here. I had worked with him many years ago, making diagnosis of OSA with sleep study at that time. He remembers disliking CPAP "too tight". Since then has gained 15 or 20 pounds. Admits daytime sleepiness. They can't verify loud snoring or witnessed apnea. His major complaint is persistent, perennial nasal congestion. Uses Flonase and nasal saline sprays, occasional Zyrtec. Nose sometimes runs. No purulent discharge. Medical problems include high blood pressure. Asthma with use of rescue inhaler "sometimes".  Prior to Admission medications   Medication Sig Start Date End Date Taking? Authorizing Provider  fluticasone (FLONASE) 50 MCG/ACT nasal spray Place 1 spray into both nostrils daily. 07/10/14  Yes Ripudeep Krystal Eaton, MD  metoprolol tartrate (LOPRESSOR) 25 MG tablet TAKE ONE TABLET BY MOUTH TWICE DAILY 06/16/15  Yes Golden Circle, FNP  omeprazole (PRILOSEC) 20 MG capsule Take 20 mg by mouth daily.   Yes Historical Provider, MD  PROAIR RESPICLICK [id number] (90 BASE) MCG/ACT AEPB INHALE ONE TO TWO PUFFS BY MOUTH INTO THE LUNGS EVERY 4 HOURS AS NEEDED 11/18/14  Yes Golden Circle, FNP  sodium chloride (OCEAN) 0.65 % SOLN nasal spray Place 1 spray into both nostrils 2 (two) times daily.   Yes Historical Provider, MD   Past Medical History  Diagnosis Date  . Hypertension   . Asthma   . Hyperlipemia   . Gout   . Arthritis   . Depression   . GERD (gastroesophageal reflux disease)   . Allergy    Past Surgical History  Procedure Laterality Date  . Brain surgery     Family History  Problem Relation Age of Onset  . Arthritis Mother   . Hyperlipidemia Mother   . Hypertension Mother   . Diabetes Mother   . Arthritis Father   . Hypertension Father   . Diabetes Father   . Asthma Maternal Grandmother    Social History   Social History  . Marital Status: Single   Spouse Name: N/A  . Number of Children: 0  . Years of Education: 12   Occupational History  . Not on file.   Social History Main Topics  . Smoking status: Never Smoker   . Smokeless tobacco: Never Used  . Alcohol Use: No  . Drug Use: No  . Sexual Activity: Not on file   Other Topics Concern  . Not on file   Social History Narrative   Fun - everything   Denies religious beliefs effecting health care.    ROS-see HPI   Negative unless "+" Constitutional:    weight loss, night sweats, fevers, chills, fatigue, lassitude. HEENT:    headaches, difficulty swallowing, tooth/dental problems, sore throat,       sneezing, itching, ear ache, + nasal congestion, post nasal drip, snoring CV:    chest pain, orthopnea, PND, swelling in lower extremities, anasarca,                                                             dizziness, palpitations Resp:   shortness of breath with exertion or at rest.                productive cough,   non-productive cough, coughing up of blood.  change in color of mucus.  wheezing.   Skin:    rash or lesions. GI:  No-   heartburn, indigestion, abdominal pain, nausea, vomiting, diarrhea,                 change in bowel habits, loss of appetite GU: dysuria, change in color of urine, no urgency or frequency.   flank pain. MS:   joint pain, stiffness, decreased range of motion, back pain. Neuro-     nothing unusual Psych:  change in mood or affect.  depression or anxiety.   memory loss.  OBJ- Physical Exam General- Alert, Oriented, Affect-appropriate, Distress- none acute, + appears to be "special needs" Skin- rash-none, lesions- none, excoriation- none Lymphadenopathy- none Head- atraumatic            Eyes- Gross vision intact, PERRLA, conjunctivae and secretions clear            Ears- Hearing, canals-normal            Nose- Clear, no-Septal dev, mucus, polyps +, erosion, perforation             Throat- Mallampati III , mucosa clear , drainage-  none, tonsils- atrophic Neck- flexible , trachea midline, no stridor , thyroid nl, carotid no bruit Chest - symmetrical excursion , unlabored           Heart/CV- RRR , no murmur , no gallop  , no rub, nl s1 s2                           - JVD- none , edema- none, stasis changes- none, varices- none           Lung- clear to P&A, wheeze- none, cough- none , dullness-none, rub- none           Chest wall-  Abd-  Br/ Gen/ Rectal- Not done, not indicated Extrem- cyanosis- none, clubbing, none, atrophy- none, strength- nl Neuro- grossly intact to observation

## 2015-07-21 NOTE — Assessment & Plan Note (Signed)
Initial diagnosis 1983. Non-compliant with CPAP and lost to follow-up. His primary complaint now is nasal congestion. If we can improve this first, then we will get a more reliable sleep study evaluation of his OSA component and move forward with therapy is appropriate.

## 2015-07-22 LAB — RESPIRATORY ALLERGY PROFILE REGION II ~~LOC~~
Allergen, Comm Silver Birch, t9: <0.1 kU/L
Allergen, D pternoyssinus,d7: <0.1 kU/L
Allergen, Mouse Urine Protein, e78: <0.1 kU/L
Allergen, Mulberry, t76: <0.1 kU/L
Aspergillus fumigatus, m3: <0.1 kU/L
Bermuda Grass: <0.1 kU/L
Box Elder IgE: <0.1 kU/L
Cat Dander: <0.1 kU/L
Cladosporium Herbarum: <0.1 kU/L
Cockroach: <0.1 kU/L
Common Ragweed: <0.1 kU/L
D. farinae: <0.1 kU/L
Elm IgE: <0.1 kU/L
IgE (Immunoglobulin E), Serum: 298 kU/L — ABNORMAL HIGH (ref <115)
Oak: <0.1 kU/L
Pecan/Hickory Tree IgE: <0.1 kU/L
Penicillium Notatum: <0.1 kU/L
Timothy Grass: <0.1 kU/L

## 2015-07-25 ENCOUNTER — Telehealth: Payer: Self-pay | Admitting: Internal Medicine

## 2015-07-25 NOTE — Telephone Encounter (Signed)
Notes Recorded by Deneise Lever, MD on 07/23/2015 at 10:18 AM Allergy antibody tests don't indicate that he is allergic to anything specific. We will go over this at next visit. ----------------------------  Spoke with pt's sister, aware of results/recs.  Nothing further needed.

## 2015-07-29 ENCOUNTER — Ambulatory Visit (INDEPENDENT_AMBULATORY_CARE_PROVIDER_SITE_OTHER): Payer: Medicare Other | Admitting: Nurse Practitioner

## 2015-07-29 ENCOUNTER — Ambulatory Visit (INDEPENDENT_AMBULATORY_CARE_PROVIDER_SITE_OTHER)
Admission: RE | Admit: 2015-07-29 | Discharge: 2015-07-29 | Disposition: A | Discharge status: Home / Self Care | Payer: Medicare Other | Source: Ambulatory Visit | Attending: Internal Medicine | Admitting: Internal Medicine

## 2015-07-29 ENCOUNTER — Encounter: Payer: Self-pay | Admitting: Nurse Practitioner

## 2015-07-29 VITALS — BP 138/86 | HR 77 | Temp 98.2°F | Ht 74.0 in | Wt 302.0 lb

## 2015-07-29 DIAGNOSIS — J339 Nasal polyp, unspecified: Secondary | ICD-10-CM | POA: Diagnosis not present

## 2015-07-29 DIAGNOSIS — R0981 Nasal congestion: Secondary | ICD-10-CM | POA: Diagnosis not present

## 2015-07-29 MED ORDER — AZELASTINE-FLUTICASONE 137-50 MCG/ACT NA SUSP: 1.0000 | Freq: Two times a day (BID) | NASAL | Status: DC

## 2015-07-29 NOTE — Progress Notes (Signed)
Patient ID: Roberto Horne, male    DOB: [birth year]  Age: 55 y.o. MRN: [id number]  CC: Nasal Congestion   HPI [name] presents for CC of nasal congestion x years, but rhinorrhea and cough x 2 days.  1) Same as when he saw Dr. Annamaria Boots on 07/21/15 with nasal congestion.  Rhinorrhea and a cough x 2 days, cough worse today Dymista- helped then flonase   Treatment to date:  Saline nasal spray  Benadryl- Wednesday helpful   History [name] has a past medical history of Hypertension; Asthma; Hyperlipemia; Gout; Arthritis; Depression; GERD (gastroesophageal reflux disease); and Allergy.   He has past surgical history that includes Brain surgery.   His family history includes Arthritis in his father and mother; Asthma in his maternal grandmother; Diabetes in his father and mother; Hyperlipidemia in his mother; Hypertension in his father and mother.He reports that he has never smoked. He has never used smokeless tobacco. He reports that he does not drink alcohol or use illicit drugs.  Outpatient Prescriptions Prior to Visit  Medication Sig Dispense Refill  . fluticasone (FLONASE) 50 MCG/ACT nasal spray Place 1 spray into both nostrils daily. 16 g 1  . metoprolol tartrate (LOPRESSOR) 25 MG tablet TAKE ONE TABLET BY MOUTH TWICE DAILY 180 tablet 0  . omeprazole (PRILOSEC) 20 MG capsule Take 20 mg by mouth daily.    Marland Kitchen PROAIR RESPICLICK 123XX123 (90 BASE) MCG/ACT AEPB INHALE ONE TO TWO PUFFS BY MOUTH INTO THE LUNGS EVERY 4 HOURS AS NEEDED 1 each 2  . sodium chloride (OCEAN) 0.65 % SOLN nasal spray Place 1 spray into both nostrils 2 (two) times daily.     No facility-administered medications prior to visit.    ROS Review of Systems  Constitutional: Negative for fever, chills, diaphoresis and fatigue.  HENT: Positive for congestion, postnasal drip and rhinorrhea. Negative for ear discharge, ear pain, sinus pressure, sneezing, sore throat, tinnitus, trouble swallowing and voice change.   Eyes:  Negative for visual disturbance.  Respiratory: Positive for cough. Negative for chest tightness, shortness of breath and wheezing.   Cardiovascular: Negative for chest pain, palpitations and leg swelling.  Gastrointestinal: Negative for nausea, vomiting, diarrhea and rectal pain.  Skin: Negative for rash.  Neurological: Negative for dizziness.    Objective:  BP 138/86 mmHg  Pulse 77  Temp(Src) 98.2 F (36.8 C) (Oral)  Ht 6\' 2"  (1.88 m)  Wt 302 lb (136.986 kg)  BMI 38.76 kg/m2  SpO2 96%  Physical Exam  Constitutional: He is oriented to person, place, and time. He appears well-developed and well-nourished. No distress.  HENT:  Head: Normocephalic and atraumatic.  Right Ear: External ear normal.  Left Ear: External ear normal.  Mouth/Throat: No oropharyngeal exudate.  Hard to visualize oropharynx due to lots of tissue- needs sleep study  Nasal polyps visualized in nares- white/clear rhinorrhea    Eyes: EOM are normal. Pupils are equal, round, and reactive to light. Right eye exhibits no discharge. Left eye exhibits no discharge.  Cardiovascular: Normal rate, regular rhythm and normal heart sounds.  Exam reveals no gallop and no friction rub.   No murmur heard. Pulmonary/Chest: Effort normal and breath sounds normal. No respiratory distress. He has no wheezes. He has no rales. He exhibits no tenderness.  Neurological: He is alert and oriented to person, place, and time.  Skin: Skin is warm and dry. No rash noted. He is not diaphoretic.   Assessment & Plan:   Othel was seen today for nasal congestion.  Diagnoses and all orders for this visit:  Nasal polyps  Other orders -     Azelastine-Fluticasone 137-50 MCG/ACT SUSP; Place 1 spray into the nose 2 (two) times daily.  I am having [name] start on Azelastine-Fluticasone. I am also having him maintain his sodium chloride, fluticasone, omeprazole, PROAIR RESPICLICK, and metoprolol tartrate.  Meds ordered this encounter   Medications  . Azelastine-Fluticasone 137-50 MCG/ACT SUSP    Sig: Place 1 spray into the nose 2 (two) times daily.    Dispense:  1 Bottle    Refill:  0    Order Specific Question:  Supervising Provider    Answer:  Crecencio Mc [[id number]]     Follow-up: Return if symptoms worsen or fail to improve.

## 2015-07-29 NOTE — Progress Notes (Signed)
Pre visit review using our clinic review tool, if applicable. No additional management support is needed unless otherwise documented below in the visit note. 

## 2015-07-29 NOTE — Patient Instructions (Signed)
Try the Dymista again  Claritin Regular   Mucinex DM

## 2015-08-02 NOTE — Assessment & Plan Note (Addendum)
New problem to me Sent Dymista to pharmacy (Dr. Annamaria Boots gave sample to see if helpful) Since patient had better relief than with the Flonase Patient is going for CT scan today of sinuses at 4 PM Advised him to try the Claritin and Mucinex DM as needed   I have personally reviewed the note from Dr. Annamaria Boots on 07/21/2015 with pertinent information in the history of present illness.

## 2015-08-04 ENCOUNTER — Other Ambulatory Visit: Payer: Self-pay | Admitting: Internal Medicine

## 2015-08-04 DIAGNOSIS — J339 Nasal polyp, unspecified: Secondary | ICD-10-CM

## 2015-08-18 DIAGNOSIS — R0683 Snoring: Secondary | ICD-10-CM | POA: Diagnosis not present

## 2015-08-18 DIAGNOSIS — R439 Unspecified disturbances of smell and taste: Secondary | ICD-10-CM | POA: Diagnosis not present

## 2015-08-18 DIAGNOSIS — G4733 Obstructive sleep apnea (adult) (pediatric): Secondary | ICD-10-CM | POA: Diagnosis not present

## 2015-08-18 DIAGNOSIS — J324 Chronic pansinusitis: Secondary | ICD-10-CM | POA: Diagnosis not present

## 2015-08-18 DIAGNOSIS — J339 Nasal polyp, unspecified: Secondary | ICD-10-CM | POA: Diagnosis not present

## 2015-08-23 DIAGNOSIS — J329 Chronic sinusitis, unspecified: Secondary | ICD-10-CM | POA: Diagnosis not present

## 2015-08-30 ENCOUNTER — Ambulatory Visit (INDEPENDENT_AMBULATORY_CARE_PROVIDER_SITE_OTHER)
Admission: RE | Admit: 2015-08-30 | Discharge: 2015-08-30 | Disposition: A | Discharge status: Home / Self Care | Payer: Medicare Other | Source: Ambulatory Visit | Attending: Family | Admitting: Family

## 2015-08-30 ENCOUNTER — Encounter: Payer: Self-pay | Admitting: Family

## 2015-08-30 ENCOUNTER — Ambulatory Visit (INDEPENDENT_AMBULATORY_CARE_PROVIDER_SITE_OTHER): Payer: Medicare Other | Admitting: Family

## 2015-08-30 ENCOUNTER — Telehealth: Payer: Self-pay | Admitting: Family

## 2015-08-30 VITALS — BP 122/88 | HR 91 | Temp 97.9°F | Resp 16 | Ht 74.0 in | Wt 304.0 lb

## 2015-08-30 DIAGNOSIS — J069 Acute upper respiratory infection, unspecified: Secondary | ICD-10-CM | POA: Diagnosis not present

## 2015-08-30 DIAGNOSIS — M79672 Pain in left foot: Secondary | ICD-10-CM | POA: Diagnosis not present

## 2015-08-30 NOTE — Assessment & Plan Note (Signed)
Left foot pain and symptoms consistent with sprain with concern for fracture. Obtain x-rays. Tylenol as needed for discomfort. Ice and elevation. Range of motion and toe exercises as instructed. Follow-up if symptoms worsen or fail to improve pending x-ray results.

## 2015-08-30 NOTE — Progress Notes (Signed)
Pre visit review using our clinic review tool, if applicable. No additional management support is needed unless otherwise documented below in the visit note. 

## 2015-08-30 NOTE — Progress Notes (Signed)
Subjective:    Patient ID: Roberto Horne, male    DOB: Jan 19, 1961, 55 y.o.   MRN: [id number]  Chief Complaint  Patient presents with  . Cough    has some coughing and chest congestion, SOB, wheezing, x5 days, right hip pain x4 days    HPI:  Roberto Horne is a 55 y.o. male who  has a past medical history of Hypertension; Asthma; Hyperlipemia; Gout; Arthritis; Depression; GERD (gastroesophageal reflux disease); and Allergy. and presents today for an acute office visit.   1.) Cough - This is a new problem. Associated symptom of coughing, chest congestion, shortness of breath and wheezing has been going on for about 5 days. Denies fevers. No recent antibiotic use.   2.) Right foot pain - This is a new problem. Associated symptom of pain located in his left foot has been going on for about 2 days. Pain is located on the medial aspect of his left foot. Aggravated when standing on it. Modifying factors include non-weightbearing which relieves some of the pressure. Denies any specific trauma, numbness or tingling.    Allergies  Allergen Reactions  . Ibuprofen Itching     Current Outpatient Prescriptions on File Prior to Visit  Medication Sig Dispense Refill  . Azelastine-Fluticasone 137-50 MCG/ACT SUSP Place 1 spray into the nose 2 (two) times daily. 1 Bottle 0  . fluticasone (FLONASE) 50 MCG/ACT nasal spray Place 1 spray into both nostrils daily. 16 g 1  . metoprolol tartrate (LOPRESSOR) 25 MG tablet TAKE ONE TABLET BY MOUTH TWICE DAILY 180 tablet 0  . omeprazole (PRILOSEC) 20 MG capsule Take 20 mg by mouth daily.    Marland Kitchen PROAIR RESPICLICK 123XX123 (90 BASE) MCG/ACT AEPB INHALE ONE TO TWO PUFFS BY MOUTH INTO THE LUNGS EVERY 4 HOURS AS NEEDED 1 each 2  . sodium chloride (OCEAN) 0.65 % SOLN nasal spray Place 1 spray into both nostrils 2 (two) times daily.     No current facility-administered medications on file prior to visit.     Review of Systems  Constitutional: Negative for fever and  chills.  HENT: Positive for congestion. Negative for sneezing and sore throat.   Respiratory: Positive for cough, shortness of breath and wheezing. Negative for chest tightness.       Objective:    BP 122/88 mmHg  Pulse 91  Temp(Src) 97.9 F (36.6 C) (Oral)  Resp 16  Ht 6\' 2"  (1.88 m)  Wt 304 lb (137.893 kg)  BMI 39.01 kg/m2  SpO2 98% Nursing note and vital signs reviewed.  Physical Exam  Constitutional: He is oriented to person, place, and time. He appears well-developed and well-nourished. No distress.  HENT:  Right Ear: Hearing, tympanic membrane, external ear and ear canal normal.  Left Ear: Hearing, tympanic membrane, external ear and ear canal normal.  Nose: Nose normal. Right sinus exhibits no maxillary sinus tenderness and no frontal sinus tenderness. Left sinus exhibits no maxillary sinus tenderness and no frontal sinus tenderness.  Mouth/Throat: Uvula is midline, oropharynx is clear and moist and mucous membranes are normal.  Nasal polyps noted.  Cardiovascular: Normal rate, regular rhythm, normal heart sounds and intact distal pulses.   Pulmonary/Chest: Effort normal and breath sounds normal.  Musculoskeletal:  Left foot - no obvious deformity, discussion, or edema noted. Palpable tenderness along first metatarsal and medial longitudinal arch. Toe and ankle range of motion are within normal limits. Pulses are intact and capillary refill is appropriate. Negative tarsal glide.  Neurological: He is alert and  oriented to person, place, and time.  Skin: Skin is warm and dry.  Psychiatric: He has a normal mood and affect. His behavior is normal. Judgment and thought content normal.       Assessment & Plan:   Problem List Items Addressed This Visit      Respiratory   Acute upper respiratory infection    Symptoms and exam consistent with acute upper respiratory infection and possible allergic rhinitis. Treat conservatively with over-the-counter medications as needed for  symptom relief and supportive care. Follow-up if symptoms worsen or fail to improve.        Other   Left foot pain - Primary    Left foot pain and symptoms consistent with sprain with concern for fracture. Obtain x-rays. Tylenol as needed for discomfort. Ice and elevation. Range of motion and toe exercises as instructed. Follow-up if symptoms worsen or fail to improve pending x-ray results.      Relevant Orders   DG Foot Complete Left

## 2015-08-30 NOTE — Assessment & Plan Note (Signed)
Symptoms and exam consistent with acute upper respiratory infection and possible allergic rhinitis. Treat conservatively with over-the-counter medications as needed for symptom relief and supportive care. Follow-up if symptoms worsen or fail to improve.

## 2015-08-30 NOTE — Patient Instructions (Addendum)
Thank you for choosing [facility].  Summary/Instructions:  Ice 2-3 times per day and as needed for discomfort.  Elevation above heart Tylenol as needed for discomfort.  Ace wrap as needed for compression - wrap from toes to heart   Continue Claritin for allergies.   General Recommendations:    Please drink plenty of fluids.  Get plenty of rest   Sleep in humidified air  Use saline nasal sprays  Netti pot   OTC Medications:  Decongestants - helps relieve congestion   Flonase (generic fluticasone) or Nasacort (generic triamcinolone) - please make sure to use the "cross-over" technique at a 45 degree angle towards the opposite eye as opposed to straight up the nasal passageway.   Sudafed (generic pseudoephedrine - Note this is the one that is available behind the pharmacy counter); Products with phenylephrine (-PE) may also be used but is often not as effective as pseudoephedrine.   If you have HIGH BLOOD PRESSURE - Coricidin HBP; AVOID any product that is -D as this contains pseudoephedrine which may increase your blood pressure.  Afrin (oxymetazoline) every 6-8 hours for up to 3 days.   Allergies - helps relieve runny nose, itchy eyes and sneezing   Claritin (generic loratidine), Allegra (fexofenidine), or Zyrtec (generic cyrterizine) for runny nose. These medications should not cause drowsiness.  Note - Benadryl (generic diphenhydramine) may be used however may cause drowsiness  Cough -   Delsym or Robitussin (generic dextromethorphan)  Expectorants - helps loosen mucus to ease removal   Mucinex (generic guaifenesin) as directed on the package.  Headaches / General Aches   Tylenol (generic acetaminophen) - DO NOT EXCEED 3 grams (3,000 mg) in a 24 hour time period  Advil/Motrin (generic ibuprofen)   Sore Throat -   Salt water gargle   Chloraseptic (generic benzocaine) spray or lozenges / Sucrets (generic dyclonine)    If your symptoms worsen  or fail to improve, please contact our office for further instruction, or in case of emergency go directly to the emergency room at the closest medical facility.

## 2015-08-30 NOTE — Telephone Encounter (Signed)
Please inform patient that his x-rays are negative. Therefore please continue with ice and Tylenol as needed. Wear a supportive shoe and follow up if symptoms worsen or fail to improve.

## 2015-08-31 NOTE — Telephone Encounter (Signed)
Pt aware of results 

## 2015-09-26 ENCOUNTER — Ambulatory Visit (INDEPENDENT_AMBULATORY_CARE_PROVIDER_SITE_OTHER): Payer: Medicare Other | Admitting: Internal Medicine

## 2015-09-26 ENCOUNTER — Encounter: Payer: Self-pay | Admitting: Internal Medicine

## 2015-09-26 VITALS — BP 120/64 | HR 72 | Ht 75.0 in | Wt 305.8 lb

## 2015-09-26 DIAGNOSIS — G4733 Obstructive sleep apnea (adult) (pediatric): Secondary | ICD-10-CM

## 2015-09-26 MED ORDER — PREDNISONE 10 MG PO TABS: 10.0000 mg | ORAL_TABLET | Freq: Every day | ORAL | Status: DC

## 2015-09-26 MED ORDER — AZELASTINE-FLUTICASONE 137-50 MCG/ACT NA SUSP: NASAL | Status: DC

## 2015-09-26 NOTE — Patient Instructions (Signed)
Script sent to [facility] for Dymista nasal spray to use every day  Script sent for prednisone 10 mg take one daily to help shrink the polyps in your nose  Please call the ENT office and see when they want you to return for follow-up about your polyps  Order- schedule unattended home sleep test for dx OSA

## 2015-09-26 NOTE — Progress Notes (Signed)
07/21/2015-55 year old male never smoker referred courtesy of Mauricio Po; sleep study done 1983 at [facility] here. I had worked with him many years ago, making diagnosis of OSA with sleep study at that time. He remembers disliking CPAP "too tight". Since then has gained 15 or 20 pounds. Admits daytime sleepiness. They can't verify loud snoring or witnessed apnea. His major complaint is persistent, perennial nasal congestion. Uses Flonase and nasal saline sprays, occasional Zyrtec. Nose sometimes runs. No purulent discharge. Medical problems include high blood pressure. Asthma with use of rescue inhaler "sometimes".  09/26/2015-55 year old male never smoker followed for OSA/noncompliant with CPAP,  allergic rhinitis,, nasal polyps( [facility]ad),  HBP, asthma, complicated by obesity, Allergy NSAIDs/ibuprofen itching FOLLOW FOR: Nasal Polyps.  sinus congestion, sinus pressure, headaches. CT max fac 07/29/2015- IMPRESSION: 1. Opacified nasal cavity and nasopharynx with polypoid lesion suggested measuring at least 2.6 and up to 3.6 cm. 2. Subtotal opacification of the bilateral maxillary, ethmoid, and frontal sinuses. Bubbly opacity in the right sphenoid sinus compatible with acute sinusitis. 3. The constellation favors sinonasal polyposis. Electronically Signed  By: Genevie Ann M.D.  On: 07/29/2015 17:07 CBC differential 07/21/2015-normal eos, total IgE 298 but normal environmental IgE allergy panel     ROS-see HPI   Negative unless "+" Constitutional:    weight loss, night sweats, fevers, chills, fatigue, lassitude. HEENT:    headaches, difficulty swallowing, tooth/dental problems, sore throat,       sneezing, itching, ear ache, + nasal congestion, post nasal drip, snoring CV:    chest pain, orthopnea, PND, swelling in lower extremities, anasarca,                                                             dizziness, palpitations Resp:   shortness of breath with exertion or at  rest.                productive cough,   non-productive cough, coughing up of blood.              change in color of mucus.  wheezing.   Skin:    rash or lesions. GI:  No-   heartburn, indigestion, abdominal pain, nausea, vomiting, diarrhea,                 change in bowel habits, loss of appetite GU: dysuria, change in color of urine, no urgency or frequency.   flank pain. MS:   joint pain, stiffness, decreased range of motion, back pain. Neuro-     nothing unusual Psych:  change in mood or affect.  depression or anxiety.   memory loss.  OBJ- Physical Exam General- Alert, Oriented, Affect-appropriate, Distress- none acute, + appears to be "special needs" Skin- rash-none, lesions- none, excoriation- none Lymphadenopathy- none Head- atraumatic            Eyes- Gross vision intact, PERRLA, conjunctivae and secretions clear            Ears- Hearing, canals-normal            Nose- Clear, no-Septal dev, mucus, polyps +, erosion, perforation             Throat- Mallampati III , mucosa clear , drainage- none, tonsils- atrophic Neck- flexible , trachea midline, no stridor , thyroid nl, carotid no bruit  Chest - symmetrical excursion , unlabored           Heart/CV- RRR , no murmur , no gallop  , no rub, nl s1 s2                           - JVD- none , edema- none, stasis changes- none, varices- none           Lung- clear to P&A, wheeze- none, cough- none , dullness-none, rub- none           Chest wall-  Abd-  Br/ Gen/ Rectal- Not done, not indicated Extrem- cyanosis- none, clubbing, none, atrophy- none, strength- nl Neuro- grossly intact to observation

## 2015-09-29 ENCOUNTER — Telehealth: Payer: Self-pay | Admitting: Internal Medicine

## 2015-09-29 NOTE — Telephone Encounter (Signed)
Spoke with pt's sister Roberto Horne, states that [facility] needs "approval" before it can be filled.   [facility], states that [place] needs a PA.  This is being faxed to our office, verified fax # on file. Will hold in triage to await fax.

## 2015-09-30 ENCOUNTER — Telehealth: Payer: Self-pay | Admitting: Internal Medicine

## 2015-09-30 MED ORDER — AZELASTINE HCL 0.1 % NA SOLN: NASAL | Status: DC

## 2015-09-30 MED ORDER — FLUTICASONE PROPIONATE 50 MCG/ACT NA SUSP: NASAL | Status: DC

## 2015-09-30 NOTE — Telephone Encounter (Signed)
PA has not been received yet. Dr. Annamaria Boots, please advise if you would like to proceed with Dymista PA or change to generics.  Thank you.

## 2015-09-30 NOTE — Telephone Encounter (Signed)
Rx sent to pharmacy for both generics. Called and left detailed message on voicemail advising patient that Rx has been sent to pharmacy. Advised patient if any questions to call our office. Nothing further needed.

## 2015-09-30 NOTE — Telephone Encounter (Signed)
It is okay to change Dymista nasal spray to the 2 generics:  Fluticasone NS #1 1-2 sprays in each nostril at bedtime with 11 refills and Astelin NS #1 1-2 sprays in each nostril at bedtime with 11 refills.  Thanks.

## 2015-09-30 NOTE — Telephone Encounter (Signed)
Spoke with patient's wife-aware that we have spoken with patient this morning and nothing more was needed at this time.

## 2015-10-08 ENCOUNTER — Emergency Department (HOSPITAL_COMMUNITY): Payer: Medicare Other

## 2015-10-08 ENCOUNTER — Encounter (HOSPITAL_COMMUNITY): Payer: Self-pay | Admitting: Nurse Practitioner

## 2015-10-08 ENCOUNTER — Emergency Department (HOSPITAL_COMMUNITY)
Admission: EM | Admit: 2015-10-08 | Discharge: 2015-10-09 | Disposition: A | Discharge status: Home / Self Care | Payer: Medicare Other | Attending: Emergency Medicine | Admitting: Emergency Medicine

## 2015-10-08 DIAGNOSIS — Z79899 Other long term (current) drug therapy: Secondary | ICD-10-CM | POA: Insufficient documentation

## 2015-10-08 DIAGNOSIS — K219 Gastro-esophageal reflux disease without esophagitis: Secondary | ICD-10-CM | POA: Diagnosis not present

## 2015-10-08 DIAGNOSIS — Z8659 Personal history of other mental and behavioral disorders: Secondary | ICD-10-CM | POA: Insufficient documentation

## 2015-10-08 DIAGNOSIS — Z7951 Long term (current) use of inhaled steroids: Secondary | ICD-10-CM | POA: Diagnosis not present

## 2015-10-08 DIAGNOSIS — M1990 Unspecified osteoarthritis, unspecified site: Secondary | ICD-10-CM | POA: Diagnosis not present

## 2015-10-08 DIAGNOSIS — K8020 Calculus of gallbladder without cholecystitis without obstruction: Secondary | ICD-10-CM

## 2015-10-08 DIAGNOSIS — R1032 Left lower quadrant pain: Secondary | ICD-10-CM | POA: Diagnosis not present

## 2015-10-08 DIAGNOSIS — R109 Unspecified abdominal pain: Secondary | ICD-10-CM | POA: Diagnosis not present

## 2015-10-08 DIAGNOSIS — I10 Essential (primary) hypertension: Secondary | ICD-10-CM | POA: Insufficient documentation

## 2015-10-08 DIAGNOSIS — M109 Gout, unspecified: Secondary | ICD-10-CM | POA: Diagnosis not present

## 2015-10-08 DIAGNOSIS — J45909 Unspecified asthma, uncomplicated: Secondary | ICD-10-CM | POA: Diagnosis not present

## 2015-10-08 DIAGNOSIS — R52 Pain, unspecified: Secondary | ICD-10-CM

## 2015-10-08 DIAGNOSIS — Z7952 Long term (current) use of systemic steroids: Secondary | ICD-10-CM | POA: Insufficient documentation

## 2015-10-08 DIAGNOSIS — K8070 Calculus of gallbladder and bile duct without cholecystitis without obstruction: Secondary | ICD-10-CM | POA: Diagnosis not present

## 2015-10-08 LAB — URINALYSIS, ROUTINE W REFLEX MICROSCOPIC
BILIRUBIN URINE: NEGATIVE
Glucose, UA: NEGATIVE mg/dL
Hgb urine dipstick: NEGATIVE
Ketones, ur: NEGATIVE mg/dL
Leukocytes, UA: NEGATIVE
NITRITE: NEGATIVE
PH: 5.5 (ref 5.0–8.0)
Protein, ur: NEGATIVE mg/dL
SPECIFIC GRAVITY, URINE: 1.023 (ref 1.005–1.030)

## 2015-10-08 LAB — CBC
HEMATOCRIT: 41.5 % (ref 39.0–52.0)
HEMOGLOBIN: 12.9 g/dL — AB (ref 13.0–17.0)
MCH: 23.3 pg — AB (ref 26.0–34.0)
MCHC: 31.1 g/dL (ref 30.0–36.0)
MCV: 74.9 fL — ABNORMAL LOW (ref 78.0–100.0)
Platelets: 190 10*3/uL (ref 150–400)
RBC: 5.54 MIL/uL (ref 4.22–5.81)
RDW: 15.6 % — ABNORMAL HIGH (ref 11.5–15.5)
WBC: 12.7 10*3/uL — ABNORMAL HIGH (ref 4.0–10.5)

## 2015-10-08 LAB — COMPREHENSIVE METABOLIC PANEL
ALBUMIN: 3 g/dL — AB (ref 3.5–5.0)
ALT: 14 U/L — ABNORMAL LOW (ref 17–63)
ANION GAP: 9 (ref 5–15)
AST: 15 U/L (ref 15–41)
Alkaline Phosphatase: 53 U/L (ref 38–126)
BUN: 10 mg/dL (ref 6–20)
CO2: 24 mmol/L (ref 22–32)
Calcium: 9.2 mg/dL (ref 8.9–10.3)
Chloride: 104 mmol/L (ref 101–111)
Creatinine, Ser: 0.79 mg/dL (ref 0.61–1.24)
GFR calc non Af Amer: >60 mL/min (ref >60)
GLUCOSE: 115 mg/dL — AB (ref 65–99)
POTASSIUM: 4 mmol/L (ref 3.5–5.1)
SODIUM: 137 mmol/L (ref 135–145)
Total Bilirubin: 1.1 mg/dL (ref 0.3–1.2)
Total Protein: 7.7 g/dL (ref 6.5–8.1)

## 2015-10-08 MED ORDER — HYDROCODONE-ACETAMINOPHEN 5-325 MG PO TABS: 1.0000 | ORAL_TABLET | ORAL | Status: DC | PRN

## 2015-10-08 MED ORDER — IOPAMIDOL (ISOVUE-300) INJECTION 61%
INTRAVENOUS | Status: AC
  Administered 2015-10-08: 100 mL
  Filled 2015-10-08: qty 100

## 2015-10-08 NOTE — ED Provider Notes (Signed)
CSN: [id number]     Arrival date & time 10/08/15  1654 History   First MD Initiated Contact with Patient 10/08/15 1813     Chief Complaint  Patient presents with  . Abdominal Pain     (Consider location/radiation/quality/duration/timing/severity/associated sxs/prior Treatment) HPI   Roberto Horne is a 55 y.o. male who presents for evaluation of "sharp, cramping pain", below umbilicus. Symptoms started yesterday. He has not eaten today because he has not felt hungry. There's been no fever, chills, nausea, vomiting, change in bowel or urinary habits. He denies diarrhea and constipation. No similar problems in the past. He is planning on having surgery for nasal polyps, next week. He is taking his usual medications. There are no other known modifying factors.   Past Medical History  Diagnosis Date  . Hypertension   . Asthma   . Hyperlipemia   . Gout   . Arthritis   . Depression   . GERD (gastroesophageal reflux disease)   . Allergy    Past Surgical History  Procedure Laterality Date  . Brain surgery     Family History  Problem Relation Age of Onset  . Arthritis Mother   . Hyperlipidemia Mother   . Hypertension Mother   . Diabetes Mother   . Arthritis Father   . Hypertension Father   . Diabetes Father   . Asthma Maternal Grandmother    Social History  Substance Use Topics  . Smoking status: Never Smoker   . Smokeless tobacco: Never Used  . Alcohol Use: No    Review of Systems  All other systems reviewed and are negative.     Allergies  Ibuprofen  Home Medications   Prior to Admission medications   Medication Sig Start Date End Date Taking? Authorizing Provider  azelastine (ASTELIN) 0.1 % nasal spray 1-2 sprays each nostril twice daily as needed Patient taking differently: Place 1 spray into both nostrils daily as needed for rhinitis or allergies. 1-2 sprays each nostril twice daily as needed 09/30/15  Yes Deneise Lever, MD  fluticasone (FLONASE) 50 MCG/ACT  nasal spray 1-2 sprays each nostril twice daily as needed Patient taking differently: Place 1 spray into both nostrils daily as needed for allergies or rhinitis. 1-2 sprays each nostril twice daily as needed 09/30/15  Yes Clinton D Young, MD  loratadine (CLARITIN) 10 MG tablet Take 10 mg by mouth daily as needed for allergies.    Yes Historical Provider, MD  metoprolol tartrate (LOPRESSOR) 25 MG tablet TAKE ONE TABLET BY MOUTH TWICE DAILY 06/16/15  Yes Golden Circle, FNP  omeprazole (PRILOSEC) 20 MG capsule Take 20 mg by mouth daily as needed (for heartburn).    Yes Historical Provider, MD  predniSONE (DELTASONE) 10 MG tablet Take 1 tablet (10 mg total) by mouth daily with breakfast. 09/26/15  Yes Deneise Lever, MD  Azelastine-Fluticasone 137-50 MCG/ACT SUSP 1-2 puffs each nostril once daily at bedtime 09/26/15   Deneise Lever, MD  fluticasone (FLONASE) 50 MCG/ACT nasal spray Place 1 spray into both nostrils daily. 07/10/14   Ripudeep Krystal Eaton, MD  HYDROcodone-acetaminophen (NORCO) 5-325 MG tablet Take 1 tablet by mouth every 4 (four) hours as needed. 10/08/15   Daleen Bo, MD  PROAIR RESPICLICK 123XX123 (90 BASE) MCG/ACT AEPB INHALE ONE TO TWO PUFFS BY MOUTH INTO THE LUNGS EVERY 4 HOURS AS NEEDED 11/18/14   Golden Circle, FNP   BP 166/79 mmHg  Pulse 91  Temp(Src) 98.1 F (36.7 C) (Oral)  Resp 16  SpO2 98% Physical Exam  Constitutional: He is oriented to person, place, and time. He appears well-developed.  Morbidly obese  HENT:  Head: Normocephalic and atraumatic.  Right Ear: External ear normal.  Left Ear: External ear normal.  Eyes: Conjunctivae and EOM are normal. Pupils are equal, round, and reactive to light.  Neck: Normal range of motion and phonation normal. Neck supple.  Cardiovascular: Normal rate, regular rhythm and normal heart sounds.   Pulmonary/Chest: Effort normal and breath sounds normal. He exhibits no bony tenderness.  Abdominal: Soft. There is no tenderness.  The entire  abdomen is nontender to palpation. Patient has marked abdominal obesity with a large panniculus.  Musculoskeletal: Normal range of motion.  Neurological: He is alert and oriented to person, place, and time. No cranial nerve deficit or sensory deficit. He exhibits normal muscle tone. Coordination normal.  Skin: Skin is warm, dry and intact.  Psychiatric: He has a normal mood and affect. His behavior is normal. Judgment and thought content normal.  Nursing note and vitals reviewed.   ED Course  Procedures (including critical care time)  Medications  iopamidol (ISOVUE-300) 61 % injection (100 mLs  Contrast Given 10/08/15 2110)    Patient Vitals for the past 24 hrs:  BP Temp Temp src Pulse Resp SpO2  10/08/15 2100 166/79 mmHg - - 91 - 98 %  10/08/15 2000 (!) 146/108 mmHg - - 90 - 96 %  10/08/15 1930 (!) 160/104 mmHg - - 90 - 94 %  10/08/15 1901 131/82 mmHg - - 95 16 100 %  10/08/15 1805 (!) 158/114 mmHg - - 70 20 95 %  10/08/15 1701 (!) 128/108 mmHg 98.1 F (36.7 C) Oral (!) 55 17 96 %    22:05- CT imaging results, related to vascular system, discussed with on-call vascular surgeon, Dr. Shelby Dubin. He thinks it is unlikely that the CT abnormality is causing the patient's symptoms. He would not work it up further, unless the patient develops claudication symptoms.  11:49 PM Reevaluation with update and discussion. After initial assessment and treatment, an updated evaluation reveals he remains comfortable. No further complaints. Findings discussed with patient and brother, all questions answered. Roberto Horne L     Labs Review Labs Reviewed  COMPREHENSIVE METABOLIC PANEL - Abnormal; Notable for the following:    Glucose, Bld 115 (*)    Albumin 3.0 (*)    ALT 14 (*)    All other components within normal limits  CBC - Abnormal; Notable for the following:    WBC 12.7 (*)    Hemoglobin 12.9 (*)    MCV 74.9 (*)    MCH 23.3 (*)    RDW 15.6 (*)    All other components within normal  limits  URINALYSIS, ROUTINE W REFLEX MICROSCOPIC (NOT AT [facility])    Imaging Review Ct Abdomen Pelvis W Contrast  10/08/2015  CLINICAL DATA:  Patient with intermittent stabbing left lower quadrant abdominal pain. EXAM: CT ABDOMEN AND PELVIS WITH CONTRAST TECHNIQUE: Multidetector CT imaging of the abdomen and pelvis was performed using the standard protocol following bolus administration of intravenous contrast. CONTRAST:  172mL ISOVUE-300 IOPAMIDOL (ISOVUE-300) INJECTION 61% COMPARISON:  CT abdomen pelvis 10/02/2011; ultrasound abdomen 07/08/2014 FINDINGS: Lower chest: Small hiatal hernia. Normal heart size. Minimal dependent atelectasis. No pleural effusion. Hepatobiliary: Liver is normal in size and contour. No focal hepatic lesions identified. There is circumferential wall thickening of the gallbladder with pericholecystic fat stranding. Small amount of sludge within the gallbladder lumen. Pancreas: Unremarkable Spleen: Unremarkable Adrenals/Urinary  Tract: Normal adrenal glands. Kidneys enhance symmetrically with contrast. There is a 5 cm cyst within the inferior pole of the left kidney. Urinary bladder is unremarkable. Stomach/Bowel: No abnormal bowel wall thickening or evidence for bowel obstruction. No free fluid or free intraperitoneal air. Small hiatal hernia. Normal morphology to stomach. Vascular/Lymphatic: Normal caliber abdominal aorta. Within the left external iliac artery (image 64; series 2) there is focal narrowing. Other: Process unremarkable. Musculoskeletal: Marked degenerative changes L4-5 and L5-S1. No aggressive or acute appearing osseous lesions. IMPRESSION: Wall thickening and focal inflammatory change about the gallbladder concerning for acute cholecystitis. This can be further evaluated with right upper quadrant ultrasound as clinically indicated. Focal narrowing of the mid aspect of the left external iliac artery potentially secondary to mural thrombus. Short-segment dissection is not  entirely excluded. Electronically Signed   By: Lovey Newcomer M.D.   On: 10/08/2015 21:44   Dg Abd 2 Views  10/08/2015  CLINICAL DATA:  Hervey Ard abdominal pain for 1 day EXAM: ABDOMEN - 2 VIEW COMPARISON:  10/02/2011 CT abdomen/ pelvis. FINDINGS: Moderate gaseous distention of the sigmoid colon in the medial left abdomen. No dilated small bowel loops or air-fluid levels. Moderate stool throughout the colon. No evidence of pneumatosis or pneumoperitoneum. No pathologic soft tissue calcifications. Moderate degenerative changes in the visualized thoracolumbar spine. IMPRESSION: Moderate gaseous distention of the sigmoid colon in the medial left abdomen. Recommend further evaluation with CT abdomen/pelvis with IV and oral contrast to exclude sigmoid volvulus. Electronically Signed   By: Ilona Sorrel M.D.   On: 10/08/2015 19:00   I have personally reviewed and evaluated these images and lab results as part of my medical decision-making.   EKG Interpretation None      MDM   Final diagnoses:  Pain  Calculus of gallbladder without cholecystitis without obstruction    Ultrasound imaging indicates gallstones without cholecystitis. Patient is comfortable and does not require further treatment at this time. He is stable for discharge with outpatient management.   Nursing Notes Reviewed/ Care Coordinated Applicable Imaging Reviewed Interpretation of Laboratory Data incorporated into ED treatment  The patient appears reasonably screened and/or stabilized for discharge and I doubt any other medical condition or other [facility] requiring further screening, evaluation, or treatment in the ED at this time prior to discharge.  Plan: Home Medications- Norco; Home Treatments- low-fat diet; return here if the recommended treatment, does not improve the symptoms; Recommended follow up- Gen. Surg. 1 week     Daleen Bo, MD 10/08/15 2352

## 2015-10-08 NOTE — ED Notes (Signed)
Pt taken to CT.

## 2015-10-08 NOTE — Discharge Instructions (Signed)
Call the surgeon for follow-up appointment. Stay on a low-fat diet, until you see the surgeon.    Cholelithiasis Cholelithiasis (also called gallstones) is a form of gallbladder disease in which gallstones form in your gallbladder. The gallbladder is an organ that stores bile made in the liver, which helps digest fats. Gallstones begin as small crystals and slowly grow into stones. Gallstone pain occurs when the gallbladder spasms and a gallstone is blocking the duct. Pain can also occur when a stone passes out of the duct.  RISK FACTORS  Being male.   Having multiple pregnancies. Health care providers sometimes advise removing diseased gallbladders before future pregnancies.   Being obese.  Eating a diet heavy in fried foods and fat.   Being older than 46 years and increasing age.   Prolonged use of medicines containing male hormones.   Having diabetes mellitus.   Rapidly losing weight.   Having a family history of gallstones (heredity).  SYMPTOMS  Nausea.   Vomiting.  Abdominal pain.   Yellowing of the skin (jaundice).   Sudden pain. It may persist from several minutes to several hours.  Fever.   Tenderness to the touch. In some cases, when gallstones do not move into the bile duct, people have no pain or symptoms. These are called "silent" gallstones.  TREATMENT Silent gallstones do not need treatment. In severe cases, emergency surgery may be required. Options for treatment include:  Surgery to remove the gallbladder. This is the most common treatment.  Medicines. These do not always work and may take 6-12 months or more to work.  Shock wave treatment (extracorporeal biliary lithotripsy). In this treatment an ultrasound machine sends shock waves to the gallbladder to break gallstones into smaller pieces that can pass into the intestines or be dissolved by medicine. HOME CARE INSTRUCTIONS   Only take over-the-counter or prescription medicines  for pain, discomfort, or fever as directed by your health care provider.   Follow a low-fat diet until seen again by your health care provider. Fat causes the gallbladder to contract, which can result in pain.   Follow up with your health care provider as directed. Attacks are almost always recurrent and surgery is usually required for permanent treatment.  SEEK IMMEDIATE MEDICAL CARE IF:   Your pain increases and is not controlled by medicines.   You have a fever or persistent symptoms for more than 2-3 days.   You have a fever and your symptoms suddenly get worse.   You have persistent nausea and vomiting.  MAKE SURE YOU:   Understand these instructions.  Will watch your condition.  Will get help right away if you are not doing well or get worse.   This information is not intended to replace advice given to you by your health care provider. Make sure you discuss any questions you have with your health care provider.   Document Released: 06/07/2005 Document Revised: 02/11/2013 Document Reviewed: 12/03/2012 Elsevier Interactive Patient Education 2016 Elsevier Inc. Fat and Cholesterol Restricted Diet High levels of fat and cholesterol in your blood may lead to various health problems, such as diseases of the heart, blood vessels, gallbladder, liver, and pancreas. Fats are concentrated sources of energy that come in various forms. Certain types of fat, including saturated fat, may be harmful in excess. Cholesterol is a substance needed by your body in small amounts. Your body makes all the cholesterol it needs. Excess cholesterol comes from the food you eat. When you have high levels of cholesterol and  saturated fat in your blood, health problems can develop because the excess fat and cholesterol will gather along the walls of your blood vessels, causing them to narrow. Choosing the right foods will help you control your intake of fat and cholesterol. This will help keep the levels  of these substances in your blood within normal limits and reduce your risk of disease. WHAT IS MY PLAN? Your health care provider recommends that you:  Get no more than __________ % of the total calories in your daily diet from fat.  Limit your intake of saturated fat to less than ______% of your total calories each day.  Limit the amount of cholesterol in your diet to less than _________mg per day. WHAT TYPES OF FAT SHOULD I CHOOSE?  Choose healthy fats more often. Choose monounsaturated and polyunsaturated fats, such as olive and canola oil, flaxseeds, walnuts, almonds, and seeds.  Eat more omega-3 fats. Good choices include salmon, mackerel, sardines, tuna, flaxseed oil, and ground flaxseeds. Aim to eat fish at least two times a week.  Limit saturated fats. Saturated fats are primarily found in animal products, such as meats, butter, and cream. Plant sources of saturated fats include palm oil, palm kernel oil, and coconut oil.  Avoid foods with partially hydrogenated oils in them. These contain trans fats. Examples of foods that contain trans fats are stick margarine, some tub margarines, cookies, crackers, and other baked goods. WHAT GENERAL GUIDELINES DO I NEED TO FOLLOW? These guidelines for healthy eating will help you control your intake of fat and cholesterol:  Check food labels carefully to identify foods with trans fats or high amounts of saturated fat.  Fill one half of your plate with vegetables and green salads.  Fill one fourth of your plate with whole grains. Look for the word "whole" as the first word in the ingredient list.  Fill one fourth of your plate with lean protein foods.  Limit fruit to two servings a day. Choose fruit instead of juice.  Eat more foods that contain soluble fiber. Examples of foods that contain this type of fiber are apples, broccoli, carrots, beans, peas, and barley. Aim to get 20-30 g of fiber per day.  Eat more home-cooked food and less  restaurant, buffet, and fast food.  Limit or avoid alcohol.  Limit foods high in starch and sugar.  Limit fried foods.  Cook foods using methods other than frying. Baking, boiling, grilling, and broiling are all great options.  Lose weight if you are overweight. Losing just 5-10% of your initial body weight can help your overall health and prevent diseases such as diabetes and heart disease. WHAT FOODS CAN I EAT? Grains Whole grains, such as whole wheat or whole grain breads, crackers, cereals, and pasta. Unsweetened oatmeal, bulgur, barley, quinoa, or brown rice. Corn or whole wheat flour tortillas. Vegetables Fresh or frozen vegetables (raw, steamed, roasted, or grilled). Green salads. Fruits All fresh, canned (in natural juice), or frozen fruits. Meat and Other Protein Products Ground beef (85% or leaner), grass-fed beef, or beef trimmed of fat. Skinless chicken or [place]. Ground chicken or [place]. Pork trimmed of fat. All fish and seafood. Eggs. Dried beans, peas, or lentils. Unsalted nuts or seeds. Unsalted canned or dry beans. Dairy Low-fat dairy products, such as skim or 1% milk, 2% or reduced-fat cheeses, low-fat ricotta or cottage cheese, or plain low-fat yogurt. Fats and Oils Tub margarines without trans fats. Light or reduced-fat mayonnaise and salad dressings. Avocado. Olive, canola, sesame, or  safflower oils. Natural peanut or almond butter (choose ones without added sugar and oil). The items listed above may not be a complete list of recommended foods or beverages. Contact your dietitian for more options. WHAT FOODS ARE NOT RECOMMENDED? Grains White bread. White pasta. White rice. Cornbread. Bagels, pastries, and croissants. Crackers that contain trans fat. Vegetables White potatoes. Corn. Creamed or fried vegetables. Vegetables in a cheese sauce. Fruits Dried fruits. Canned fruit in light or heavy syrup. Fruit juice. Meat and Other Protein Products Fatty cuts of meat.  Ribs, chicken wings, bacon, sausage, bologna, salami, chitterlings, fatback, hot dogs, bratwurst, and packaged luncheon meats. Liver and organ meats. Dairy Whole or 2% milk, cream, half-and-half, and cream cheese. Whole milk cheeses. Whole-fat or sweetened yogurt. Full-fat cheeses. Nondairy creamers and whipped toppings. Processed cheese, cheese spreads, or cheese curds. Sweets and Desserts Corn syrup, sugars, honey, and molasses. Candy. Jam and jelly. Syrup. Sweetened cereals. Cookies, pies, cakes, donuts, muffins, and ice cream. Fats and Oils Butter, stick margarine, lard, shortening, ghee, or bacon fat. Coconut, palm kernel, or palm oils. Beverages Alcohol. Sweetened drinks (such as sodas, lemonade, and fruit drinks or punches). The items listed above may not be a complete list of foods and beverages to avoid. Contact your dietitian for more information.   This information is not intended to replace advice given to you by your health care provider. Make sure you discuss any questions you have with your health care provider.   Document Released: 06/11/2005 Document Revised: 07/02/2014 Document Reviewed: 09/09/2013 Elsevier Interactive Patient Education [employer].

## 2015-10-08 NOTE — ED Notes (Signed)
He c/o 1 day history of intermittent lower abd pain. Describes as "sharp." Reports diarrhea. Denies fevers, n/v, urinary changes.

## 2015-10-09 NOTE — ED Notes (Signed)
Pt given water 

## 2015-10-11 ENCOUNTER — Other Ambulatory Visit: Payer: Self-pay

## 2015-10-11 ENCOUNTER — Encounter (HOSPITAL_COMMUNITY)
Admission: RE | Admit: 2015-10-11 | Discharge: 2015-10-11 | Disposition: A | Discharge status: Home / Self Care | Payer: Medicare Other | Source: Ambulatory Visit | Attending: Otolaryngology | Admitting: Otolaryngology

## 2015-10-11 ENCOUNTER — Encounter (HOSPITAL_COMMUNITY): Payer: Self-pay

## 2015-10-11 DIAGNOSIS — J339 Nasal polyp, unspecified: Secondary | ICD-10-CM | POA: Diagnosis not present

## 2015-10-11 DIAGNOSIS — J324 Chronic pansinusitis: Secondary | ICD-10-CM | POA: Diagnosis not present

## 2015-10-11 DIAGNOSIS — E785 Hyperlipidemia, unspecified: Secondary | ICD-10-CM | POA: Diagnosis not present

## 2015-10-11 DIAGNOSIS — I10 Essential (primary) hypertension: Secondary | ICD-10-CM | POA: Diagnosis not present

## 2015-10-11 DIAGNOSIS — Z6841 Body Mass Index (BMI) 40.0 and over, adult: Secondary | ICD-10-CM | POA: Diagnosis not present

## 2015-10-11 DIAGNOSIS — J45909 Unspecified asthma, uncomplicated: Secondary | ICD-10-CM | POA: Diagnosis not present

## 2015-10-11 DIAGNOSIS — K219 Gastro-esophageal reflux disease without esophagitis: Secondary | ICD-10-CM | POA: Diagnosis not present

## 2015-10-11 DIAGNOSIS — G4733 Obstructive sleep apnea (adult) (pediatric): Secondary | ICD-10-CM | POA: Diagnosis not present

## 2015-10-11 LAB — CBC
HEMATOCRIT: 40.8 % (ref 39.0–52.0)
HEMOGLOBIN: 13.2 g/dL (ref 13.0–17.0)
MCH: 24.4 pg — AB (ref 26.0–34.0)
MCHC: 32.4 g/dL (ref 30.0–36.0)
MCV: 75.6 fL — AB (ref 78.0–100.0)
Platelets: 188 10*3/uL (ref 150–400)
RBC: 5.4 MIL/uL (ref 4.22–5.81)
RDW: 16.3 % — ABNORMAL HIGH (ref 11.5–15.5)
WBC: 9.1 10*3/uL (ref 4.0–10.5)

## 2015-10-11 LAB — BASIC METABOLIC PANEL
Anion gap: 11 (ref 5–15)
BUN: 20 mg/dL (ref 6–20)
CHLORIDE: 102 mmol/L (ref 101–111)
CO2: 25 mmol/L (ref 22–32)
CREATININE: 1.42 mg/dL — AB (ref 0.61–1.24)
Calcium: 9.2 mg/dL (ref 8.9–10.3)
GFR calc Af Amer: >60 mL/min (ref >60)
GFR, EST NON AFRICAN AMERICAN: 54 mL/min — AB (ref >60)
Glucose, Bld: 101 mg/dL — ABNORMAL HIGH (ref 65–99)
Potassium: 4 mmol/L (ref 3.5–5.1)
SODIUM: 138 mmol/L (ref 135–145)

## 2015-10-11 NOTE — Pre-Procedure Instructions (Signed)
    [place]  10/11/2015      [id number] [place] [place] [id number] [place] [address] Phone: [phone] Fax: [phone]  [facility] [address] [place], [place] - [address] [place] [address] Phone: [phone] Fax: [phone]  [id number] - [place], [place] [id number] [place] [id number] Phone: [phone] Fax: [phone]    Your procedure is scheduled on October 14, 2015.  Report to [facility] Admitting at 8 A.M.  Call this number if you have problems the morning of surgery:  [phone]   Remember:  Do not eat food or drink liquids after midnight.  Take these medicines the morning of surgery with A SIP OF WATER :metoprolol tartrate (LOPRESSOR),predniSONE (DELTASONE) , fluticasone (FLONASE)   IF NEEDED: HYDROcodone-acetaminophen (NORCO), PROAIR RESPICLICK 123XX123 (90 BASE) - BRING WITH YOU, omeprazole (PRILOSEC), loratadine (CLARITIN)    STOP ASPIRIN, HERBAL MEDICATIONS, NSAID'S (ALEVE, ADVIL, IBUPROFEN) ONE WEEK PRIOR TO SURGERY   Do not wear jewelry.  Do not wear lotions, powders, or colognes.  You may wear deodorant.  Men may shave face and neck ONLY.  Do not bring valuables to the hospital.  [facility] is not responsible for any belongings or valuables.  Contacts, dentures or bridgework may not be worn into surgery.  Leave your suitcase in the car.  After surgery it may be brought to your room.  For patients admitted to the hospital, discharge time will be determined by your treatment team.  Patients discharged the day of surgery will not be allowed to drive home.   Name and phone number of your driver:    Special instructions:  "PREPARING FOR SURGERY"  Please read over the following fact sheets that you were given. Pain Booklet, Coughing and Deep Breathing and Surgical  Site Infection Prevention

## 2015-10-12 NOTE — Progress Notes (Signed)
Anesthesia Chart Review:  Pt is a 55 year old male scheduled for B endoscopic sinus surgery with navigation on 10/14/2015 with Dr. Redmond Baseman.   PMH includes:  HTN, hyperlipidemia, asthma, GERD. Never smoker. BMI 41  Medications include: metoprolol, prilosec, prednisone, albuterol  Preoperative labs reviewed.  Cr 1.42, BUN 20. Usual baseline cr around 0.7. Will recheck DOS.   EKG 10/11/15: Sinus rhythm with Premature supraventricular complexes  If no changes, I anticipate pt can proceed with surgery as scheduled.   Willeen Cass, FNP-BC [facility]Anesthesiology Phone: [phone] 10/12/2015 9:21 AM

## 2015-10-13 ENCOUNTER — Other Ambulatory Visit: Payer: Self-pay | Admitting: Otolaryngology

## 2015-10-14 ENCOUNTER — Ambulatory Visit (HOSPITAL_COMMUNITY): Payer: Medicare Other | Admitting: Anesthesiology

## 2015-10-14 ENCOUNTER — Ambulatory Visit (HOSPITAL_COMMUNITY): Payer: Medicare Other | Admitting: Emergency Medicine

## 2015-10-14 ENCOUNTER — Observation Stay (HOSPITAL_COMMUNITY)
Admission: RE | Admit: 2015-10-14 | Discharge: 2015-10-15 | Disposition: A | Discharge status: Home / Self Care | Payer: Medicare Other | Source: Ambulatory Visit | Attending: Otolaryngology | Admitting: Otolaryngology

## 2015-10-14 ENCOUNTER — Encounter (HOSPITAL_COMMUNITY): Payer: Self-pay | Admitting: General Practice

## 2015-10-14 ENCOUNTER — Encounter (HOSPITAL_COMMUNITY)
Admission: RE | Disposition: A | Discharge status: Home / Self Care | Payer: Self-pay | Source: Ambulatory Visit | Attending: Otolaryngology

## 2015-10-14 DIAGNOSIS — G4733 Obstructive sleep apnea (adult) (pediatric): Secondary | ICD-10-CM | POA: Insufficient documentation

## 2015-10-14 DIAGNOSIS — K219 Gastro-esophageal reflux disease without esophagitis: Secondary | ICD-10-CM | POA: Diagnosis not present

## 2015-10-14 DIAGNOSIS — J329 Chronic sinusitis, unspecified: Secondary | ICD-10-CM | POA: Diagnosis not present

## 2015-10-14 DIAGNOSIS — E785 Hyperlipidemia, unspecified: Secondary | ICD-10-CM | POA: Insufficient documentation

## 2015-10-14 DIAGNOSIS — I10 Essential (primary) hypertension: Secondary | ICD-10-CM | POA: Diagnosis not present

## 2015-10-14 DIAGNOSIS — J324 Chronic pansinusitis: Secondary | ICD-10-CM | POA: Diagnosis not present

## 2015-10-14 DIAGNOSIS — J323 Chronic sphenoidal sinusitis: Secondary | ICD-10-CM | POA: Diagnosis not present

## 2015-10-14 DIAGNOSIS — Z6841 Body Mass Index (BMI) 40.0 and over, adult: Secondary | ICD-10-CM | POA: Insufficient documentation

## 2015-10-14 DIAGNOSIS — M1990 Unspecified osteoarthritis, unspecified site: Secondary | ICD-10-CM | POA: Diagnosis not present

## 2015-10-14 DIAGNOSIS — J339 Nasal polyp, unspecified: Secondary | ICD-10-CM | POA: Diagnosis present

## 2015-10-14 DIAGNOSIS — J322 Chronic ethmoidal sinusitis: Secondary | ICD-10-CM | POA: Diagnosis not present

## 2015-10-14 DIAGNOSIS — J320 Chronic maxillary sinusitis: Secondary | ICD-10-CM | POA: Diagnosis not present

## 2015-10-14 DIAGNOSIS — J45909 Unspecified asthma, uncomplicated: Secondary | ICD-10-CM | POA: Insufficient documentation

## 2015-10-14 HISTORY — PX: SINUS ENDO W/FUSION: SHX777

## 2015-10-14 HISTORY — PX: FUNCTIONAL ENDOSCOPIC SINUS SURGERY: SUR616

## 2015-10-14 LAB — BASIC METABOLIC PANEL
Anion gap: 9 (ref 5–15)
BUN: 15 mg/dL (ref 6–20)
CHLORIDE: 107 mmol/L (ref 101–111)
CO2: 23 mmol/L (ref 22–32)
Calcium: 8.9 mg/dL (ref 8.9–10.3)
Creatinine, Ser: 0.87 mg/dL (ref 0.61–1.24)
GFR calc non Af Amer: >60 mL/min (ref >60)
Glucose, Bld: 96 mg/dL (ref 65–99)
POTASSIUM: 3.9 mmol/L (ref 3.5–5.1)
Sodium: 139 mmol/L (ref 135–145)

## 2015-10-14 LAB — GLUCOSE, CAPILLARY: Glucose-Capillary: 118 mg/dL — ABNORMAL HIGH (ref 65–99)

## 2015-10-14 SURGERY — SINUS SURGERY, ENDOSCOPIC, USING COMPUTER-ASSISTED NAVIGATION
Anesthesia: General | Site: Nose | Laterality: Bilateral

## 2015-10-14 MED ORDER — POTASSIUM CHLORIDE IN NACL 20-0.45 MEQ/L-% IV SOLN
INTRAVENOUS | Status: DC
  Administered 2015-10-14 – 2015-10-15 (×2): via INTRAVENOUS
  Filled 2015-10-14 (×6): qty 1000

## 2015-10-14 MED ORDER — FENTANYL CITRATE (PF) 100 MCG/2ML IJ SOLN
25.0000 ug | INTRAMUSCULAR | Status: DC | PRN
  Administered 2015-10-14 (×2): 50 ug via INTRAVENOUS

## 2015-10-14 MED ORDER — 0.9 % SODIUM CHLORIDE (POUR BTL) OPTIME
TOPICAL | Status: DC | PRN
  Administered 2015-10-14: 1000 mL

## 2015-10-14 MED ORDER — FENTANYL CITRATE (PF) 250 MCG/5ML IJ SOLN
INTRAMUSCULAR | Status: AC
  Filled 2015-10-14: qty 5

## 2015-10-14 MED ORDER — CEFAZOLIN SODIUM 1-5 GM-% IV SOLN
INTRAVENOUS | Status: DC | PRN
  Administered 2015-10-14: 3 g via INTRAVENOUS

## 2015-10-14 MED ORDER — OXYMETAZOLINE HCL 0.05 % NA SOLN
NASAL | Status: AC
  Filled 2015-10-14: qty 15

## 2015-10-14 MED ORDER — PROPOFOL 10 MG/ML IV BOLUS
INTRAVENOUS | Status: DC | PRN
  Administered 2015-10-14: 200 mg via INTRAVENOUS

## 2015-10-14 MED ORDER — PROMETHAZINE HCL 25 MG RE SUPP: 12.5000 mg | Freq: Four times a day (QID) | RECTAL | Status: DC | PRN

## 2015-10-14 MED ORDER — PROPOFOL 10 MG/ML IV BOLUS
INTRAVENOUS | Status: AC
  Filled 2015-10-14: qty 20

## 2015-10-14 MED ORDER — HYDROCODONE-ACETAMINOPHEN 5-325 MG PO TABS: 1.0000 | ORAL_TABLET | ORAL | Status: DC | PRN

## 2015-10-14 MED ORDER — PREDNISONE 20 MG PO TABS
40.0000 mg | ORAL_TABLET | Freq: Every day | ORAL | Status: DC
  Administered 2015-10-15: 40 mg via ORAL
  Filled 2015-10-14: qty 2

## 2015-10-14 MED ORDER — LIDOCAINE HCL (CARDIAC) 20 MG/ML IV SOLN
INTRAVENOUS | Status: AC
  Filled 2015-10-14: qty 20

## 2015-10-14 MED ORDER — LABETALOL HCL 5 MG/ML IV SOLN
INTRAVENOUS | Status: DC | PRN
  Administered 2015-10-14 (×2): 2.5 mg via INTRAVENOUS

## 2015-10-14 MED ORDER — ONDANSETRON HCL 4 MG/2ML IJ SOLN
INTRAMUSCULAR | Status: DC | PRN
  Administered 2015-10-14: 4 mg via INTRAVENOUS

## 2015-10-14 MED ORDER — ROCURONIUM BROMIDE 100 MG/10ML IV SOLN
INTRAVENOUS | Status: DC | PRN
  Administered 2015-10-14: 20 mg via INTRAVENOUS
  Administered 2015-10-14: 50 mg via INTRAVENOUS

## 2015-10-14 MED ORDER — LACTATED RINGERS IV SOLN
INTRAVENOUS | Status: DC | PRN
  Administered 2015-10-14 (×2): via INTRAVENOUS

## 2015-10-14 MED ORDER — LIDOCAINE-EPINEPHRINE 1 %-1:100000 IJ SOLN
INTRAMUSCULAR | Status: DC | PRN
  Administered 2015-10-14: 9 mL

## 2015-10-14 MED ORDER — FENTANYL CITRATE (PF) 100 MCG/2ML IJ SOLN
INTRAMUSCULAR | Status: AC
  Filled 2015-10-14: qty 2

## 2015-10-14 MED ORDER — SALINE SPRAY 0.65 % NA SOLN
2.0000 | NASAL | Status: DC
  Administered 2015-10-14 – 2015-10-15 (×7): 2 via NASAL
  Filled 2015-10-14: qty 44

## 2015-10-14 MED ORDER — DEXMEDETOMIDINE HCL 200 MCG/2ML IV SOLN
INTRAVENOUS | Status: DC | PRN
  Administered 2015-10-14 (×8): 4 ug via INTRAVENOUS

## 2015-10-14 MED ORDER — LIDOCAINE HCL (CARDIAC) 20 MG/ML IV SOLN
INTRAVENOUS | Status: DC | PRN
  Administered 2015-10-14: 100 mg via INTRAVENOUS

## 2015-10-14 MED ORDER — MIDAZOLAM HCL 2 MG/2ML IJ SOLN
INTRAMUSCULAR | Status: AC
  Filled 2015-10-14: qty 2

## 2015-10-14 MED ORDER — EPHEDRINE SULFATE 50 MG/ML IJ SOLN
INTRAMUSCULAR | Status: AC
  Filled 2015-10-14: qty 2

## 2015-10-14 MED ORDER — MUPIROCIN 2 % EX OINT
TOPICAL_OINTMENT | CUTANEOUS | Status: AC
  Filled 2015-10-14: qty 22

## 2015-10-14 MED ORDER — LIDOCAINE-EPINEPHRINE 1 %-1:100000 IJ SOLN
INTRAMUSCULAR | Status: AC
  Filled 2015-10-14: qty 1

## 2015-10-14 MED ORDER — MORPHINE SULFATE (PF) 2 MG/ML IV SOLN
2.0000 mg | INTRAVENOUS | Status: DC | PRN
  Administered 2015-10-14: 4 mg via INTRAVENOUS
  Filled 2015-10-14: qty 2

## 2015-10-14 MED ORDER — LORATADINE 10 MG PO TABS: 10.0000 mg | ORAL_TABLET | Freq: Every day | ORAL | Status: DC | PRN

## 2015-10-14 MED ORDER — ONDANSETRON HCL 4 MG/2ML IJ SOLN
INTRAMUSCULAR | Status: AC
  Filled 2015-10-14: qty 4

## 2015-10-14 MED ORDER — INSULIN ASPART 100 UNIT/ML ~~LOC~~ SOLN: 0.0000 [IU] | Freq: Three times a day (TID) | SUBCUTANEOUS | Status: DC

## 2015-10-14 MED ORDER — ONDANSETRON HCL 4 MG/2ML IJ SOLN: 4.0000 mg | Freq: Once | INTRAMUSCULAR | Status: DC | PRN

## 2015-10-14 MED ORDER — METOPROLOL TARTRATE 25 MG PO TABS
25.0000 mg | ORAL_TABLET | Freq: Two times a day (BID) | ORAL | Status: DC
  Administered 2015-10-14 – 2015-10-15 (×2): 25 mg via ORAL
  Filled 2015-10-14 (×2): qty 1

## 2015-10-14 MED ORDER — SUGAMMADEX SODIUM 200 MG/2ML IV SOLN
INTRAVENOUS | Status: DC | PRN
  Administered 2015-10-14: 274 mg via INTRAVENOUS

## 2015-10-14 MED ORDER — AMOXICILLIN-POT CLAVULANATE 875-125 MG PO TABS: 1.0000 | ORAL_TABLET | Freq: Two times a day (BID) | ORAL | Status: DC

## 2015-10-14 MED ORDER — ARTIFICIAL TEARS OP OINT
TOPICAL_OINTMENT | OPHTHALMIC | Status: AC
Start: 2015-10-14 — End: 2015-10-14
  Filled 2015-10-14: qty 10.5

## 2015-10-14 MED ORDER — FENTANYL CITRATE (PF) 100 MCG/2ML IJ SOLN
INTRAMUSCULAR | Status: DC | PRN
  Administered 2015-10-14: 150 ug via INTRAVENOUS
  Administered 2015-10-14 (×2): 50 ug via INTRAVENOUS
  Administered 2015-10-14: 100 ug via INTRAVENOUS

## 2015-10-14 MED ORDER — LACTATED RINGERS IV SOLN
INTRAVENOUS | Status: DC
  Administered 2015-10-14: 50 mL/h via INTRAVENOUS

## 2015-10-14 MED ORDER — OXYMETAZOLINE HCL 0.05 % NA SOLN
NASAL | Status: DC | PRN
  Administered 2015-10-14: 4

## 2015-10-14 MED ORDER — SUGAMMADEX SODIUM 500 MG/5ML IV SOLN
INTRAVENOUS | Status: AC
Start: 2015-10-14 — End: 2015-10-14
  Filled 2015-10-14: qty 5

## 2015-10-14 MED ORDER — SODIUM CHLORIDE 0.9 % IR SOLN
Status: DC | PRN
  Administered 2015-10-14 (×2): 1000 mL

## 2015-10-14 MED ORDER — PREDNISONE 10 MG PO TABS: ORAL_TABLET | ORAL | Status: DC

## 2015-10-14 MED ORDER — HYDROCODONE-ACETAMINOPHEN 5-325 MG PO TABS: 1.0000 | ORAL_TABLET | Freq: Four times a day (QID) | ORAL | Status: DC | PRN

## 2015-10-14 MED ORDER — SUCCINYLCHOLINE CHLORIDE 20 MG/ML IJ SOLN
INTRAMUSCULAR | Status: AC
  Filled 2015-10-14: qty 1

## 2015-10-14 MED ORDER — SODIUM CHLORIDE 0.9 % IJ SOLN
INTRAMUSCULAR | Status: AC
Start: 2015-10-14 — End: 2015-10-14
  Filled 2015-10-14: qty 30

## 2015-10-14 MED ORDER — MUPIROCIN 2 % EX OINT
TOPICAL_OINTMENT | CUTANEOUS | Status: DC | PRN
  Administered 2015-10-14: 1 via NASAL

## 2015-10-14 MED ORDER — AMOXICILLIN-POT CLAVULANATE 875-125 MG PO TABS
1.0000 | ORAL_TABLET | Freq: Two times a day (BID) | ORAL | Status: DC
  Administered 2015-10-14 – 2015-10-15 (×2): 1 via ORAL
  Filled 2015-10-14 (×2): qty 1

## 2015-10-14 MED ORDER — PANTOPRAZOLE SODIUM 40 MG PO TBEC
40.0000 mg | DELAYED_RELEASE_TABLET | Freq: Every day | ORAL | Status: DC
  Administered 2015-10-14 – 2015-10-15 (×2): 40 mg via ORAL
  Filled 2015-10-14 (×2): qty 1

## 2015-10-14 MED ORDER — DEXAMETHASONE SODIUM PHOSPHATE 10 MG/ML IJ SOLN
INTRAMUSCULAR | Status: DC | PRN
  Administered 2015-10-14: 10 mg via INTRAVENOUS

## 2015-10-14 MED ORDER — ROCURONIUM BROMIDE 50 MG/5ML IV SOLN
INTRAVENOUS | Status: AC
  Filled 2015-10-14: qty 3

## 2015-10-14 MED ORDER — PROMETHAZINE HCL 25 MG PO TABS: 12.5000 mg | ORAL_TABLET | Freq: Four times a day (QID) | ORAL | Status: DC | PRN

## 2015-10-14 SURGICAL SUPPLY — 48 items
BLADE RAD 40 CVD SINUS 4MM (BLADE) IMPLANT
BLADE RAD40 ROTATE 4M 4 5PK (BLADE) IMPLANT
BLADE RAD40 ROTATE 4M 4MM 5PK (BLADE)
BLADE RAD60 ROTATE M4 4 5PK (BLADE) ×2 IMPLANT
BLADE RAD60 ROTATE M4 4MM 5PK (BLADE) ×1
BLADE ROTATE TRICUT 4MX13CM M4 (BLADE) ×1
BLADE ROTATE TRICUT 4X13 M4 (BLADE) ×2 IMPLANT
BLADE TRICUT ROTATE M4 4 5PK (BLADE) ×2 IMPLANT
BLADE TRICUT ROTATE M4 4MM 5PK (BLADE) ×1
CANISTER SUCTION 2500CC (MISCELLANEOUS) ×3 IMPLANT
CLOSURE STERI-STRIP 1/2X4 (GAUZE/BANDAGES/DRESSINGS) ×1
CLSR STERI-STRIP ANTIMIC 1/2X4 (GAUZE/BANDAGES/DRESSINGS) ×2 IMPLANT
COAGULATOR SUCT 6 FR SWTCH (ELECTROSURGICAL)
COAGULATOR SUCT SWTCH 10FR 6 (ELECTROSURGICAL) IMPLANT
CRADLE DONUT ADULT HEAD (MISCELLANEOUS) IMPLANT
DRAPE PROXIMA HALF (DRAPES) IMPLANT
DRESSING NASAL POPE 10X1.5X2.5 (GAUZE/BANDAGES/DRESSINGS) IMPLANT
DRESSING TELFA 8X10 (GAUZE/BANDAGES/DRESSINGS) IMPLANT
DRSG NASAL POPE 10X1.5X2.5 (GAUZE/BANDAGES/DRESSINGS)
DRSG NASOPORE 8CM (GAUZE/BANDAGES/DRESSINGS) ×3 IMPLANT
ELECT REM PT RETURN 9FT ADLT (ELECTROSURGICAL)
ELECTRODE REM PT RTRN 9FT ADLT (ELECTROSURGICAL) IMPLANT
GLOVE BIO SURGEON STRL SZ7.5 (GLOVE) ×3 IMPLANT
GOWN STRL REUS W/ TWL LRG LVL3 (GOWN DISPOSABLE) ×2 IMPLANT
GOWN STRL REUS W/TWL LRG LVL3 (GOWN DISPOSABLE) ×4
KIT BASIN OR (CUSTOM PROCEDURE TRAY) ×3 IMPLANT
KIT ROOM TURNOVER OR (KITS) ×3 IMPLANT
NEEDLE 27GAX1X1/2 (NEEDLE) ×3 IMPLANT
NEEDLE HYPO 25GX1X1/2 BEV (NEEDLE) IMPLANT
NEEDLE SPNL 22GX3.5 QUINCKE BK (NEEDLE) ×3 IMPLANT
NS IRRIG 1000ML POUR BTL (IV SOLUTION) ×3 IMPLANT
PAD ARMBOARD 7.5X6 YLW CONV (MISCELLANEOUS) ×6 IMPLANT
PAD ENT ADHESIVE 25PK (MISCELLANEOUS) ×3 IMPLANT
PATTIES SURGICAL .5 X3 (DISPOSABLE) ×3 IMPLANT
SHEATH ENDOSCRUB 0 DEG (SHEATH) ×3 IMPLANT
SHEATH ENDOSCRUB 30 DEG (SHEATH) ×6 IMPLANT
SHEATH ENDOSCRUB 45 DEG (SHEATH) ×3 IMPLANT
SOLUTION ANTI FOG 6CC (MISCELLANEOUS) ×3 IMPLANT
SUT ETHILON 3 0 PS 1 (SUTURE) IMPLANT
SWAB COLLECTION DEVICE MRSA (MISCELLANEOUS) IMPLANT
SYR 50ML SLIP (SYRINGE) IMPLANT
TOWEL OR 17X24 6PK STRL BLUE (TOWEL DISPOSABLE) ×3 IMPLANT
TRACKER ENT INSTRUMENT (MISCELLANEOUS) ×3 IMPLANT
TRACKER ENT PATIENT (MISCELLANEOUS) ×6 IMPLANT
TRAY ENT MC OR (CUSTOM PROCEDURE TRAY) ×3 IMPLANT
TUBE CONNECTING 12'X1/4 (SUCTIONS) ×1
TUBE CONNECTING 12X1/4 (SUCTIONS) ×2 IMPLANT
TUBING STRAIGHTSHOT EPS 5PK (TUBING) IMPLANT

## 2015-10-14 NOTE — Anesthesia Postprocedure Evaluation (Signed)
Anesthesia Post Note  Patient: Roberto Horne  Procedure(s) Performed: Procedure(s) (LRB): ENDOSCOPIC SINUS SURGERY WITH NAVIGATION (Bilateral)  Patient location during evaluation: PACU Anesthesia Type: General Level of consciousness: awake and alert Pain management: pain level controlled Vital Signs Assessment: post-procedure vital signs reviewed and stable Respiratory status: spontaneous breathing, nonlabored ventilation, respiratory function stable and patient connected to nasal cannula oxygen Cardiovascular status: blood pressure returned to baseline and stable Postop Assessment: no signs of nausea or vomiting Anesthetic complications: no    Last Vitals:  Filed Vitals:   10/14/15 1352 10/14/15 1414  BP: 156/87 173/110  Pulse: 79 70  Temp:  37.1 C  Resp: 16 16    Last Pain:  Filed Vitals:   10/14/15 1420  PainSc: 3                  Catalina Gravel

## 2015-10-14 NOTE — Brief Op Note (Signed)
10/14/2015  12:47 PM  PATIENT:  Roberto Horne  55 y.o. male  PRE-OPERATIVE DIAGNOSIS:  chronic sinusitis  POST-OPERATIVE DIAGNOSIS:  chronic sinusitis  PROCEDURE:  Procedure(s): ENDOSCOPIC SINUS SURGERY WITH NAVIGATION (Bilateral) Bilateral total ethmoidectomy Bilateral maxillary antrostomy Bilateral sphenoidotomy  SURGEON:  Surgeon(s) and Role:    * Melida Quitter, MD - Primary  PHYSICIAN ASSISTANT:   ASSISTANTS: none   ANESTHESIA:   general  EBL:  Total I/O In: -  Out: 100 [Blood:100]  BLOOD ADMINISTERED:none  DRAINS: none   LOCAL MEDICATIONS USED:  LIDOCAINE   SPECIMEN:  Source of Specimen:  Sinus contents  DISPOSITION OF SPECIMEN:  PATHOLOGY  COUNTS:  YES  TOURNIQUET:  * No tourniquets in log *  DICTATION: .Other Dictation: Dictation Number [id number]  PLAN OF CARE: Admit for overnight observation  PATIENT DISPOSITION:  PACU - hemodynamically stable.   Delay start of Pharmacological VTE agent (>24hrs) due to surgical blood loss or risk of bleeding: yes

## 2015-10-14 NOTE — Anesthesia Procedure Notes (Signed)
Procedure Name: Intubation Date/Time: 10/14/2015 11:18 AM Performed by: Neldon Newport Pre-anesthesia Checklist: Patient being monitored, Suction available, Timeout performed, Patient identified and Emergency Drugs available Patient Re-evaluated:Patient Re-evaluated prior to inductionOxygen Delivery Method: Circle system utilized Preoxygenation: Pre-oxygenation with 100% oxygen Intubation Type: IV induction Ventilation: Mask ventilation without difficulty Laryngoscope Size: Mac and 4 Grade View: Grade I Tube type: Oral Tube size: 7.5 mm Number of attempts: 1 Placement Confirmation: positive ETCO2,  ETT inserted through vocal cords under direct vision and breath sounds checked- equal and bilateral Secured at: 23 cm Tube secured with: Tape Dental Injury: Teeth and Oropharynx as per pre-operative assessment

## 2015-10-14 NOTE — Anesthesia Preprocedure Evaluation (Addendum)
Anesthesia Evaluation  Patient identified by MRN, date of birth, ID band Patient awake    Reviewed: Allergy & Precautions, NPO status , Patient's Chart, lab work & pertinent test results, reviewed documented beta blocker date and time   Airway Mallampati: III  TM Distance: >3 FB Neck ROM: Full    Dental  (+) Teeth Intact, Dental Advisory Given, Poor Dentition, Missing, Chipped,    Pulmonary asthma , sleep apnea ,    Pulmonary exam normal breath sounds clear to auscultation       Cardiovascular hypertension, Pt. on medications and Pt. on home beta blockers (-) angina(-) CAD and (-) Past MI Normal cardiovascular exam Rhythm:Regular Rate:Normal     Neuro/Psych PSYCHIATRIC DISORDERS Depression negative neurological ROS     GI/Hepatic Neg liver ROS, GERD  Medicated,  Endo/Other  Morbid obesity  Renal/GU Renal InsufficiencyRenal disease     Musculoskeletal  (+) Arthritis , Osteoarthritis,    Abdominal   Peds  Hematology  (+) Blood dyscrasia, anemia ,   Anesthesia Other Findings Day of surgery medications reviewed with the patient.  Reproductive/Obstetrics                          Anesthesia Physical Anesthesia Plan  ASA: III  Anesthesia Plan: General   Post-op Pain Management:    Induction: Intravenous  Airway Management Planned: Oral ETT  Additional Equipment:   Intra-op Plan:   Post-operative Plan: Extubation in OR  Informed Consent: I have reviewed the patients History and Physical, chart, labs and discussed the procedure including the risks, benefits and alternatives for the proposed anesthesia with the patient or authorized representative who has indicated his/her understanding and acceptance.   Dental advisory given and Dental Advisory Given  Plan Discussed with: CRNA and Anesthesiologist  Anesthesia Plan Comments: (Risks/benefits of general anesthesia discussed with patient  including risk of damage to teeth, lips, gum, and tongue, nausea/vomiting, allergic reactions to medications, and the possibility of heart attack, stroke and death.  All patient questions answered.  Patient wishes to proceed.)       Anesthesia Quick Evaluation

## 2015-10-14 NOTE — Discharge Instructions (Signed)
Use saline spray in each nasal passage every 2-4 hours while awake.  Change nasal drip pad as needed until bleeding stops.  Keep head elevated and avoid strenous activity or nasal blowing.  Resume your typical diet.

## 2015-10-14 NOTE — H&P (Signed)
Roberto Horne is an 55 y.o. male.   Chief Complaint: Nasal obstruction, polyps HPI: 55 year old male with nasal obstruction for years and was found to have nasal polyps.  He presents for surgical management.  Past Medical History  Diagnosis Date  . Hypertension   . Asthma   . Hyperlipemia   . Gout   . Arthritis   . Depression   . GERD (gastroesophageal reflux disease)   . Allergy     Past Surgical History  Procedure Laterality Date  . Brain surgery      Family History  Problem Relation Age of Onset  . Arthritis Mother   . Hyperlipidemia Mother   . Hypertension Mother   . Diabetes Mother   . Arthritis Father   . Hypertension Father   . Diabetes Father   . Asthma Maternal Grandmother    Social History:  reports that he has never smoked. He has never used smokeless tobacco. He reports that he does not drink alcohol or use illicit drugs.  Allergies:  Allergies  Allergen Reactions  . Ibuprofen Itching    Medications Prior to Admission  Medication Sig Dispense Refill  . azelastine (ASTELIN) 0.1 % nasal spray 1-2 sprays each nostril twice daily as needed (Patient taking differently: Place 1 spray into both nostrils daily as needed for rhinitis or allergies. 1-2 sprays each nostril twice daily as needed) 30 mL 11  . Azelastine-Fluticasone 137-50 MCG/ACT SUSP 1-2 puffs each nostril once daily at bedtime 1 Bottle 5  . fluticasone (FLONASE) 50 MCG/ACT nasal spray Place 1 spray into both nostrils daily. 16 g 1  . HYDROcodone-acetaminophen (NORCO) 5-325 MG tablet Take 1 tablet by mouth every 4 (four) hours as needed. 20 tablet 0  . loratadine (CLARITIN) 10 MG tablet Take 10 mg by mouth daily as needed for allergies.     . metoprolol tartrate (LOPRESSOR) 25 MG tablet TAKE ONE TABLET BY MOUTH TWICE DAILY 180 tablet 0  . omeprazole (PRILOSEC) 20 MG capsule Take 20 mg by mouth daily as needed (for heartburn).     . predniSONE (DELTASONE) 10 MG tablet Take 1 tablet (10 mg total) by  mouth daily with breakfast. 20 tablet 0  . PROAIR RESPICLICK 123XX123 (90 BASE) MCG/ACT AEPB INHALE ONE TO TWO PUFFS BY MOUTH INTO THE LUNGS EVERY 4 HOURS AS NEEDED 1 each 2  . fluticasone (FLONASE) 50 MCG/ACT nasal spray 1-2 sprays each nostril twice daily as needed (Patient taking differently: Place 1 spray into both nostrils daily as needed for allergies or rhinitis. 1-2 sprays each nostril twice daily as needed) 16 g 11    No results found for this or any previous visit (from the past 48 hour(s)). No results found.  Review of Systems  All other systems reviewed and are negative.   Blood pressure 156/113, pulse 89, temperature 97.9 F (36.6 C), temperature source Oral, resp. rate 18, weight 136.986 kg (302 lb), SpO2 92 %. Physical Exam  Constitutional: He is oriented to person, place, and time. He appears well-developed and well-nourished. No distress.  HENT:  Head: Normocephalic and atraumatic.  Right Ear: External ear normal.  Left Ear: External ear normal.  Nose: Nose normal.  Mouth/Throat: Oropharynx is clear and moist.  Eyes: Conjunctivae and EOM are normal. Pupils are equal, round, and reactive to light.  Neck: Normal range of motion. Neck supple.  Cardiovascular: Normal rate.   Respiratory: Effort normal.  Musculoskeletal: Normal range of motion.  Neurological: He is alert and oriented  to person, place, and time. No cranial nerve deficit.  Skin: Skin is warm and dry.  Psychiatric: He has a normal mood and affect. His behavior is normal. Judgment and thought content normal.     Assessment/Plan Nasal polyps, chronic pansinusitis To OR for endoscopic sinus surgery.  Plan overnight observation after surgery due to OSA.  Melida Quitter, MD 10/14/2015, 9:19 AM

## 2015-10-14 NOTE — Transfer of Care (Signed)
Immediate Anesthesia Transfer of Care Note  Patient: Roberto Horne  Procedure(s) Performed: Procedure(s): ENDOSCOPIC SINUS SURGERY WITH NAVIGATION (Bilateral)  Patient Location: PACU  Anesthesia Type:General  Level of Consciousness: awake, alert  and oriented  Airway & Oxygen Therapy: Patient Spontanous Breathing and Patient connected to face mask oxygen  Post-op Assessment: Report given to RN, Post -op Vital signs reviewed and stable and Patient moving all extremities X 4  Post vital signs: Reviewed and stable  Last Vitals:  Filed Vitals:   10/14/15 0903 10/14/15 0942  BP: 156/113 146/94  Pulse:    Temp:    Resp:      Complications: No apparent anesthesia complications

## 2015-10-15 DIAGNOSIS — K219 Gastro-esophageal reflux disease without esophagitis: Secondary | ICD-10-CM | POA: Diagnosis not present

## 2015-10-15 DIAGNOSIS — E785 Hyperlipidemia, unspecified: Secondary | ICD-10-CM | POA: Diagnosis not present

## 2015-10-15 DIAGNOSIS — J45909 Unspecified asthma, uncomplicated: Secondary | ICD-10-CM | POA: Diagnosis not present

## 2015-10-15 DIAGNOSIS — J324 Chronic pansinusitis: Secondary | ICD-10-CM | POA: Diagnosis not present

## 2015-10-15 DIAGNOSIS — I10 Essential (primary) hypertension: Secondary | ICD-10-CM | POA: Diagnosis not present

## 2015-10-15 DIAGNOSIS — J339 Nasal polyp, unspecified: Secondary | ICD-10-CM | POA: Diagnosis not present

## 2015-10-15 MED ORDER — AMOXICILLIN-POT CLAVULANATE 875-125 MG PO TABS: 1.0000 | ORAL_TABLET | Freq: Two times a day (BID) | ORAL | Status: DC

## 2015-10-15 NOTE — Discharge Planning (Addendum)
AVS reviewed with pt and his sister. RX to sister to get filled and pick up. Pt lives with sister. He wants to stay for lunch and sister will pick up then.  dc'd ambulatory to private car home with all personal belongings, accompanied by sister at 62

## 2015-10-15 NOTE — Progress Notes (Signed)
1 Day Post-Op  Subjective: He is doing well. He feels well and no pain. He is taking po well. He stayed overnight for his sleep apnea. He feels ready to go home.   Objective: Vital signs in last 24 hours: Temp:  [97.3 F (36.3 C)-98.7 F (37.1 C)] 98.3 F (36.8 C) (04/22 0508) Pulse Rate:  [64-92] 64 (04/22 0508) Resp:  [11-20] 20 (04/22 0508) BP: (112-187)/(60-113) 136/92 mmHg (04/22 0508) SpO2:  [90 %-100 %] 100 % (04/22 0508) Weight:  [136.986 kg (302 lb)-138 kg (304 lb 3.8 oz)] 138 kg (304 lb 3.8 oz) (04/21 1414) Last BM Date: 10/13/15  Intake/Output from previous day: 04/21 0701 - 04/22 0700 In: 3560 [P.O.:550; I.V.:3010] Out: 1000 [Urine:800; Blood:200] Intake/Output this shift: Total I/O In: 1460 [P.O.:250; I.V.:1210] Out: 450 [Urine:450]  awake alert. no bleeding. vision intact. breathing well. sats excellent. cv rrr l- clear ext no swelling or tenderness.   Lab Results:  No results for input(s): WBC, HGB, HCT, PLT in the last 72 hours. BMET  Recent Labs  10/14/15 0824  NA 139  K 3.9  CL 107  CO2 23  GLUCOSE 96  BUN 15  CREATININE 0.87  CALCIUM 8.9   PT/INR No results for input(s): LABPROT, INR in the last 72 hours. ABG No results for input(s): PHART, HCO3 in the last 72 hours.  Invalid input(s): PCO2, PO2  Studies/Results: No results found.  Anti-infectives: Anti-infectives    Start     Dose/Rate Route Frequency Ordered Stop   10/14/15 1500  amoxicillin-clavulanate (AUGMENTIN) 875-125 MG per tablet 1 tablet     1 tablet Oral Every 12 hours 10/14/15 1417     10/14/15 0000  amoxicillin-clavulanate (AUGMENTIN) 875-125 MG tablet     1 tablet Oral 2 times daily 10/14/15 1317        Assessment/Plan: s/p Procedure(s): ENDOSCOPIC SINUS SURGERY WITH NAVIGATION (Bilateral) he is doing well and awake and alert. he is ready to go home. no bleeding. breathing well. f/u per Dr Karrie Doffing, [facility] 10/15/2015

## 2015-10-17 ENCOUNTER — Encounter (HOSPITAL_COMMUNITY): Payer: Self-pay | Admitting: Otolaryngology

## 2015-10-17 NOTE — Discharge Summary (Signed)
Physician Discharge Summary  Patient ID: Roberto Horne MRN: [id number] DOB/AGE: December 06, 1960 55 y.o.  Admit date: 10/14/2015 Discharge date: 10/17/2015  Admission Diagnoses: Chronic pansinusitis and nasal polyps  Discharge Diagnoses:  Active Problems:   Nasal polyps   Discharged Condition: good  Hospital Course: 55 year old male with chronic pansinusitis and nasal polyps and sleep apnea presented for endoscopic sinus surgery.  See operative note.  He was observed overnight due to untreated sleep apnea and did quite well with no breathing difficulty and no significant bleeding.  He was felt stable for discharge on the following day.  Consults: None  Significant Diagnostic Studies: None  Treatments: surgery: Bilateral endoscopic sinus surgery  Discharge Exam: Blood pressure 103/60, pulse 92, temperature 98.3 F (36.8 C), temperature source Oral, resp. rate 18, height 6' (1.829 m), weight 138 kg (304 lb 3.8 oz), SpO2 96 %. General appearance: alert, cooperative and no distress Nose: no significant bleeding  Disposition: 01-Home or Self Care  Discharge Instructions    Call MD for:  difficulty breathing, headache or visual disturbances    Complete by:  As directed      Call MD for:  extreme fatigue    Complete by:  As directed      Call MD for:  hives    Complete by:  As directed      Call MD for:  persistant dizziness or light-headedness    Complete by:  As directed      Call MD for:  persistant nausea and vomiting    Complete by:  As directed      Call MD for:  redness, tenderness, or signs of infection (pain, swelling, redness, odor or green/yellow discharge around incision site)    Complete by:  As directed      Call MD for:  severe uncontrolled pain    Complete by:  As directed      Call MD for:  temperature >100.4    Complete by:  As directed      Diet - low sodium heart healthy    Complete by:  As directed      Diet - low sodium heart healthy    Complete by:  As  directed      Discharge instructions    Complete by:  As directed   Call office on Monday for Dr Redmond Baseman follow up. Call if any issues as should be feeling normal     Increase activity slowly    Complete by:  As directed      Increase activity slowly    Complete by:  As directed             Medication List    STOP taking these medications        azelastine 0.1 % nasal spray  Commonly known as:  ASTELIN     Azelastine-Fluticasone 137-50 MCG/ACT Susp     fluticasone 50 MCG/ACT nasal spray  Commonly known as:  FLONASE      TAKE these medications        amoxicillin-clavulanate 875-125 MG tablet  Commonly known as:  AUGMENTIN  Take 1 tablet by mouth every 12 (twelve) hours.     HYDROcodone-acetaminophen 5-325 MG tablet  Commonly known as:  NORCO/VICODIN  Take 1-2 tablets by mouth every 6 (six) hours as needed for moderate pain.     loratadine 10 MG tablet  Commonly known as:  CLARITIN  Take 10 mg by mouth daily as needed for allergies.  metoprolol tartrate 25 MG tablet  Commonly known as:  LOPRESSOR  TAKE ONE TABLET BY MOUTH TWICE DAILY     omeprazole 20 MG capsule  Commonly known as:  PRILOSEC  Take 20 mg by mouth daily as needed (for heartburn).     predniSONE 10 MG tablet  Commonly known as:  DELTASONE  Take four tablets by mouth once daily for 7 days, then three tablets once daily for 3 days, then two tablets once daily for 3 days, then one tablet once daily for 3 days.     PROAIR RESPICLICK 123XX123 (90 Base) MCG/ACT Aepb  Generic drug:  Albuterol Sulfate  INHALE ONE TO TWO PUFFS BY MOUTH INTO THE LUNGS EVERY 4 HOURS AS NEEDED           Follow-up Information    Follow up with Myson Levi, MD. Schedule an appointment as soon as possible for a visit in 2 weeks.   Specialty:  Otolaryngology   Contact information:   [address] [place] [address] [phone]       Signed: Melida Quitter 10/17/2015, 4:28 PM

## 2015-10-17 NOTE — Op Note (Signed)
NAME:  Roberto Horne, Roberto Horne                   ACCOUNT NO.:  MEDICAL RECORD NO.:  [id number]  LOCATION:                                 FACILITY:  PHYSICIAN:  Onnie Graham, MD     DATE OF BIRTH:  April 29, 1961  DATE OF PROCEDURE: DATE OF DISCHARGE:                              OPERATIVE REPORT   PREOPERATIVE DIAGNOSES:  Chronic pan sinusitis and nasal polyps.  POSTOPERATIVE DIAGNOSES:  Chronic pan sinusitis and nasal polyps.  PROCEDURE: 1. Bilateral total ethmoidectomy. 2. Bilateral maxillary antrostomy. 3. Bilateral sphenoidotomy. 4. Fusion image guidance.  SURGEON:  Onnie Graham, MD  ANESTHESIA:  General endotracheal anesthesia.  COMPLICATIONS:  None.  INDICATION:  The patient is a 55 year old male with several years of nasal obstruction and was found to have nasal polyps.  CT imaging demonstrated involvement of all the sinuses.  He has sleep apnea as well.  He presents to the operative  room for surgical management.  FINDINGS:  There were polyps in the middle and superior meatus on both sides as well as into the ethmoid sinuses.  The left maxillary sinus also had some polyp within the lining.  DESCRIPTION OF PROCEDURE:  The patient was identified in the holding room and informed consent having been obtained including discussion of risks, benefits, alternatives, the patient was brought to the operative suite and put on the table in supine position.  Anesthesia was induced. The patient was intubated by the Anesthesia Team without difficulty. The patient was given intravenous steroids and antibiotics during the case.  The eyes were lubricated and the face was prepped and draped in sterile fashion.  The fusion image guidance antenna was attached to the forehead and the eyes were taped closed with Steri-Strips.  The patient was registered to the fusion system in standard fashion.  Afrin pledgets were placed on both sides of the nose for several minutes.  Right-sided pledgets  were then removed and a 0-degree telescope was used to evaluate the nasal passage.  The lateral nasal wall was injected with 1% lidocaine with 1:100,000 epinephrine.  The polyps were then removed from the middle meatus using the microdebrider including back into the nasopharynx.  Superior meatus polyps were also removed.  This resulted in a somewhat trimmed middle turbinate.  The middle turbinate was medialized.  The polyps were then dissected into the ethmoid sinus where ethmoid septations were taken down including the ethmoid bulla and then also through the posterior lamella.  Septations were removed to the skull base and to the sphenoid rostrum.  Angled telescope was then used to evaluate the lateral nasal wall, and accessory maxillary ostium was identified and was then connected to the natural ostium using a curved microdebrider resulting in a wide antrostomy.  The middle turbinate was then lateralized and polyp removed from the superior turbinate region as was part of the superior turbinate.  The sphenoid opening was found with a suction tip and then widened laterally and superiorly using microdebrider.  Dissection along the anterior skull base was then performed in a retrograde fashion using the angled telescope and curved microdebrider to the frontal recess region.  The frontal recess was  not dissected.  Afrin pledgets were placed in the ethmoid cavity.  The same procedure was then carried on the left side without exception.  Pledgets were then removed and half of the nasal pore pack coated, and Bactroban ointment was placed in each of the ethmoid cavity and saturated with saline.  Nasal passages were suctioned and the throat was then suctioned.  He was turned back to anesthesia for wake up and was extubated and moved to recovery room in stable condition.     Onnie Graham, MD     DDB/MEDQ  D:  10/14/2015  T:  10/14/2015  Job:  [id number]

## 2015-11-01 DIAGNOSIS — J324 Chronic pansinusitis: Secondary | ICD-10-CM | POA: Diagnosis not present

## 2015-11-22 DIAGNOSIS — J324 Chronic pansinusitis: Secondary | ICD-10-CM | POA: Diagnosis not present

## 2015-11-22 DIAGNOSIS — J343 Hypertrophy of nasal turbinates: Secondary | ICD-10-CM | POA: Diagnosis not present

## 2015-12-06 ENCOUNTER — Other Ambulatory Visit: Payer: Self-pay | Admitting: Family

## 2016-01-30 ENCOUNTER — Ambulatory Visit: Payer: Medicare Other | Admitting: Internal Medicine

## 2016-02-20 DIAGNOSIS — J343 Hypertrophy of nasal turbinates: Secondary | ICD-10-CM | POA: Diagnosis not present

## 2016-02-20 DIAGNOSIS — J324 Chronic pansinusitis: Secondary | ICD-10-CM | POA: Diagnosis not present

## 2016-02-20 DIAGNOSIS — J3489 Other specified disorders of nose and nasal sinuses: Secondary | ICD-10-CM | POA: Diagnosis not present

## 2016-02-20 DIAGNOSIS — R0683 Snoring: Secondary | ICD-10-CM | POA: Diagnosis not present

## 2016-06-28 ENCOUNTER — Ambulatory Visit: Payer: Medicare Other | Admitting: Family

## 2016-07-04 ENCOUNTER — Encounter (HOSPITAL_COMMUNITY): Payer: Self-pay | Admitting: *Deleted

## 2016-07-04 ENCOUNTER — Emergency Department (HOSPITAL_COMMUNITY): Payer: Medicare Other

## 2016-07-04 ENCOUNTER — Emergency Department (HOSPITAL_COMMUNITY)
Admission: EM | Admit: 2016-07-04 | Discharge: 2016-07-05 | Disposition: A | Discharge status: Home / Self Care | Payer: Medicare Other | Attending: Emergency Medicine | Admitting: Emergency Medicine

## 2016-07-04 DIAGNOSIS — I10 Essential (primary) hypertension: Secondary | ICD-10-CM | POA: Insufficient documentation

## 2016-07-04 DIAGNOSIS — Z79899 Other long term (current) drug therapy: Secondary | ICD-10-CM | POA: Insufficient documentation

## 2016-07-04 DIAGNOSIS — M7989 Other specified soft tissue disorders: Secondary | ICD-10-CM | POA: Diagnosis not present

## 2016-07-04 DIAGNOSIS — M25571 Pain in right ankle and joints of right foot: Secondary | ICD-10-CM | POA: Insufficient documentation

## 2016-07-04 DIAGNOSIS — J45909 Unspecified asthma, uncomplicated: Secondary | ICD-10-CM | POA: Diagnosis not present

## 2016-07-04 LAB — BASIC METABOLIC PANEL
Anion gap: 8 (ref 5–15)
BUN: 13 mg/dL (ref 6–20)
CO2: 26 mmol/L (ref 22–32)
CREATININE: 0.86 mg/dL (ref 0.61–1.24)
Calcium: 9.4 mg/dL (ref 8.9–10.3)
Chloride: 108 mmol/L (ref 101–111)
GFR calc Af Amer: >60 mL/min (ref >60)
Glucose, Bld: 110 mg/dL — ABNORMAL HIGH (ref 65–99)
POTASSIUM: 4.1 mmol/L (ref 3.5–5.1)
Sodium: 142 mmol/L (ref 135–145)

## 2016-07-04 LAB — CBC WITH DIFFERENTIAL/PLATELET
Basophils Absolute: 0 10*3/uL (ref 0.0–0.1)
Basophils Relative: 0 %
EOS ABS: 0.2 10*3/uL (ref 0.0–0.7)
EOS PCT: 2 %
HCT: 42.1 % (ref 39.0–52.0)
Hemoglobin: 13.3 g/dL (ref 13.0–17.0)
LYMPHS ABS: 1.9 10*3/uL (ref 0.7–4.0)
Lymphocytes Relative: 18 %
MCH: 24.2 pg — AB (ref 26.0–34.0)
MCHC: 31.6 g/dL (ref 30.0–36.0)
MCV: 76.5 fL — ABNORMAL LOW (ref 78.0–100.0)
Monocytes Absolute: 0.8 10*3/uL (ref 0.1–1.0)
Monocytes Relative: 8 %
Neutro Abs: 7.8 10*3/uL — ABNORMAL HIGH (ref 1.7–7.7)
Neutrophils Relative %: 72 %
PLATELETS: 220 10*3/uL (ref 150–400)
RBC: 5.5 MIL/uL (ref 4.22–5.81)
RDW: 14.9 % (ref 11.5–15.5)
WBC: 10.7 10*3/uL — AB (ref 4.0–10.5)

## 2016-07-04 LAB — SEDIMENTATION RATE: Sed Rate: 48 mm/hr — ABNORMAL HIGH (ref 0–16)

## 2016-07-04 LAB — C-REACTIVE PROTEIN: CRP: 2.8 mg/dL — AB (ref <1.0)

## 2016-07-04 MED ORDER — INDOMETHACIN 25 MG PO CAPS
50.0000 mg | ORAL_CAPSULE | Freq: Once | ORAL | Status: AC
  Administered 2016-07-04: 50 mg via ORAL
  Filled 2016-07-04: qty 2

## 2016-07-04 MED ORDER — INDOMETHACIN 50 MG PO CAPS: 50.0000 mg | ORAL_CAPSULE | Freq: Three times a day (TID) | ORAL | 0 refills | Status: DC | PRN

## 2016-07-04 MED ORDER — OXYCODONE-ACETAMINOPHEN 5-325 MG PO TABS
1.0000 | ORAL_TABLET | Freq: Once | ORAL | Status: AC
  Administered 2016-07-04: 1 via ORAL
  Filled 2016-07-04: qty 1

## 2016-07-04 MED ORDER — HYDROCODONE-ACETAMINOPHEN 5-325 MG PO TABS: 1.0000 | ORAL_TABLET | Freq: Four times a day (QID) | ORAL | 0 refills | Status: DC | PRN

## 2016-07-04 NOTE — ED Provider Notes (Signed)
[facility] DEPT Provider Note   CSN: [id number] Arrival date & time: 07/04/16  1644  By signing my name below, I, Norris Cross, attest that this documentation has been prepared under the direction and in the presence of [facility], PA-C. Electronically Signed: Norris Cross , ED Scribe. 07/05/16. 12:02 AM.   History   Chief Complaint Chief Complaint  Patient presents with  . Ankle Pain    HPI  Roberto Horne is a 56 y.o. male who presents to the Emergency Department complaining of moderate constant right ankle pain and swelling x1 week. Patient thinks he may have hit the lateral aspect of his right foot on a chair, but unsure. Since the incident he has experienced sharp intermittent pain to the area. He states that the pain is exacerbated with bearing weight and movement. He has tried Ibuprofen with no relief.    The history is provided by the patient. No language interpreter was used.    Past Medical History:  Diagnosis Date  . Allergy   . Arthritis   . Asthma   . Depression   . GERD (gastroesophageal reflux disease)   . Gout   . Hyperlipemia   . Hypertension     Patient Active Problem List   Diagnosis Date Noted  . Left foot pain 08/30/2015  . Acute upper respiratory infection 08/30/2015  . Nasal polyps 07/21/2015  . Contusion, arm, upper 03/01/2015  . Hand contusion 03/01/2015  . Obstructive sleep apnea 12/16/2014  . Seasonal and perennial allergic rhinitis 09/21/2014  . Medicare annual wellness visit, subsequent 09/07/2014  . CAP (community acquired pneumonia) 07/07/2014  . Effusion of right knee 07/07/2014  . Hypertension 07/07/2014  . Abdominal pain 07/07/2014  . Pneumonia 07/07/2014  . Asthma, mild persistent 12/15/2012  . Gout, unspecified 12/15/2012  . Esophageal reflux 12/15/2012  . HTN (hypertension) 11/06/2012  . Hyperlipidemia 11/06/2012    Past Surgical History:  Procedure Laterality Date  . BRAIN SURGERY    . FUNCTIONAL ENDOSCOPIC  SINUS SURGERY  10/14/2015  . SINUS ENDO W/FUSION Bilateral 10/14/2015   Procedure: ENDOSCOPIC SINUS SURGERY WITH NAVIGATION;  Surgeon: Melida Quitter, MD;  Location: [facility];  Service: ENT;  Laterality: Bilateral;       Home Medications    Prior to Admission medications   Medication Sig Start Date End Date Taking? Authorizing Provider  amoxicillin-clavulanate (AUGMENTIN) 875-125 MG tablet Take 1 tablet by mouth every 12 (twelve) hours. 10/15/15   Melissa Montane, MD  HYDROcodone-acetaminophen (NORCO) 5-325 MG tablet Take 1 tablet by mouth every 6 (six) hours as needed for moderate pain. 07/04/16   Ozella Almond Fleetwood Pierron, PA-C  indomethacin (INDOCIN) 50 MG capsule Take 1 capsule (50 mg total) by mouth 3 (three) times daily as needed. 07/04/16   Roise Emert Pilcher Mindel Friscia, PA-C  loratadine (CLARITIN) 10 MG tablet Take 10 mg by mouth daily as needed for allergies.     Historical Provider, MD  metoprolol tartrate (LOPRESSOR) 25 MG tablet TAKE ONE TABLET BY MOUTH TWICE DAILY 12/06/15   Golden Circle, FNP  omeprazole (PRILOSEC) 20 MG capsule Take 20 mg by mouth daily as needed (for heartburn).     Historical Provider, MD  predniSONE (DELTASONE) 10 MG tablet Take four tablets by mouth once daily for 7 days, then three tablets once daily for 3 days, then two tablets once daily for 3 days, then one tablet once daily for 3 days. 10/14/15   Melida Quitter, MD  PROAIR RESPICLICK [id number] (90 BASE) MCG/ACT AEPB INHALE ONE  TO TWO PUFFS BY MOUTH INTO THE LUNGS EVERY 4 HOURS AS NEEDED 11/18/14   Golden Circle, FNP    Family History Family History  Problem Relation Age of Onset  . Arthritis Mother   . Hyperlipidemia Mother   . Hypertension Mother   . Diabetes Mother   . Arthritis Father   . Hypertension Father   . Diabetes Father   . Asthma Maternal Grandmother     Social History Social History  Substance Use Topics  . Smoking status: Never Smoker  . Smokeless tobacco: Never Used  . Alcohol use No     Allergies     Ibuprofen   Review of Systems Review of Systems  Constitutional: Negative for fever.  Musculoskeletal: Positive for arthralgias (R ankle). Negative for myalgias (calf pain).     Physical Exam Updated Vital Signs BP (!) 166/112 (BP Location: Left Arm)   Pulse 92   Temp 98.1 F (36.7 C) (Oral)   Resp 16   Ht 6\' 2"  (1.88 m)   Wt (!) 154.2 kg   SpO2 96%   BMI 43.65 kg/m   Physical Exam  Constitutional: He is oriented to person, place, and time. He appears well-developed and well-nourished. No distress.  HENT:  Head: Normocephalic and atraumatic.  Cardiovascular: Normal rate, regular rhythm and normal heart sounds.   No murmur heard. Pulmonary/Chest: Effort normal and breath sounds normal. No respiratory distress.  Musculoskeletal:  Right foot/ankle: + swelling. TTP and warmth along lateral aspect of the ankle, no streaking. 2+ DP pulses, sensation intact to medial, lateral, dorsal and plantar aspects.  Neurological: He is alert and oriented to person, place, and time.  Skin: Skin is warm and dry.  Nursing note and vitals reviewed.    ED Treatments / Results   DIAGNOSTIC STUDIES: Oxygen Saturation is 96% on adequate, RA by my interpretation.   COORDINATION OF CARE: 12:02 AM-Discussed next steps with pt. Pt verbalized understanding and is agreeable with the plan.    Labs (all labs ordered are listed, but only abnormal results are displayed) Labs Reviewed  CBC WITH DIFFERENTIAL/PLATELET - Abnormal; Notable for the following:       Result Value   WBC 10.7 (*)    MCV 76.5 (*)    MCH 24.2 (*)    Neutro Abs 7.8 (*)    All other components within normal limits  BASIC METABOLIC PANEL - Abnormal; Notable for the following:    Glucose, Bld 110 (*)    All other components within normal limits  C-REACTIVE PROTEIN - Abnormal; Notable for the following:    CRP 2.8 (*)    All other components within normal limits  SEDIMENTATION RATE - Abnormal; Notable for the  following:    Sed Rate 48 (*)    All other components within normal limits    EKG  EKG Interpretation None       Radiology Dg Ankle Complete Right  Result Date: 07/04/2016 CLINICAL DATA:  Lateral right ankle pain and swelling for 1 week. Unable to bear weight. EXAM: RIGHT ANKLE - COMPLETE 3+ VIEW COMPARISON:  Right ankle radiographs 07/07/2014. FINDINGS: Extensive soft tissue swelling is present over the lateral malleolus. A joint effusion is suspected. Ankle joint is located. No acute osseous abnormality is present. Mineralization is within normal limits. Degenerative changes are present in the midfoot. IMPRESSION: 1. Extensive soft tissue swelling over the lateral malleolus without underlying fracture. Ligamentous injury or cellulitis is considered. 2. Probable joint effusion. 3. No acute  osseous abnormality. Electronically Signed   By: San Morelle M.D.   On: 07/04/2016 18:24    Procedures Procedures (including critical care time)  Medications Ordered in ED Medications  oxyCODONE-acetaminophen (PERCOCET/ROXICET) 5-325 MG per tablet 1 tablet (1 tablet Oral Given 07/04/16 2359)  indomethacin (INDOCIN) capsule 50 mg (50 mg Oral Given 07/04/16 2359)     Initial Impression / Assessment and Plan / ED Course  I have reviewed the triage vital signs and the nursing notes.  Pertinent labs & imaging results that were available during my care of the patient were reviewed by me and considered in my medical decision making (see chart for details).  Clinical Course    Roberto Horne is a 56 y.o. male who presents to ED for persistent right ankle pain x 1 week. On exam, patient is afebrile with normal heart rate. Ankle is swollen and tender to the touch. Mildly erythematous, but no streaking noted. No open wounds or source of potential infection. No hx of DM or immunocompromised state. X-ray reviewed with attending. White count mildly elevated at 10.7. CRP of 2.8 and sed rate of 48.  Patient does have a hx of gout, but typically has flares in the right knee. Today's symptoms likely 2/2 gout flare. Will place in CAM walker and start on NSAID's. Short course pain meds given in ED as well. A significant amount of time was taken to discuss specific return precautions including development of fever, worsening pain or swelling despite treatment, spreading redness or any additional concerns. Encouraged patient to follow-up with his primary care provider for discussion of today's diagnosis as well. All questions answered.   Patient discussed with Dr. Venora Maples who agrees with treatment plan.   Final Clinical Impressions(s) / ED Diagnoses   Final diagnoses:  Acute right ankle pain    New Prescriptions New Prescriptions   HYDROCODONE-ACETAMINOPHEN (NORCO) 5-325 MG TABLET    Take 1 tablet by mouth every 6 (six) hours as needed for moderate pain.   INDOMETHACIN (INDOCIN) 50 MG CAPSULE    Take 1 capsule (50 mg total) by mouth 3 (three) times daily as needed.   I personally performed the services described in this documentation, which was scribed in my presence. The recorded information has been reviewed and is accurate.     [facility] Demetrias Goodbar, PA-C 07/05/16 0002    Jola Schmidt, MD 07/05/16 [phone]

## 2016-07-04 NOTE — Discharge Instructions (Signed)
Please take Indomethacin three times daily until pain/swelling improves. Take pain medication only as needed for severe pain - This can make you very drowsy - please do not drink alcohol, operate heavy machinery or drive on this medication. Follow up with your primary care provider for a discussion of today's diagnosis. If symptoms worsen, the redness moves further up the leg or down the foot, you develop fevers or you have any additional concerns, please return to the Emergency Department.

## 2016-07-04 NOTE — ED Triage Notes (Signed)
Pt reports right ankle pain x 1 week. Denies injury.

## 2016-07-05 ENCOUNTER — Ambulatory Visit: Payer: Medicare Other | Admitting: Family

## 2016-07-05 NOTE — Progress Notes (Signed)
Orthopedic Tech Progress Note Patient Details:  Roberto Horne 08/17/1960 [id number]  Ortho Devices Type of Ortho Device: Ace wrap Ortho Device/Splint Location: rle ankle Ortho Device/Splint Interventions: Ordered, Application   Karolee Stamps 07/05/2016, 1:56 AM

## 2016-07-05 NOTE — ED Notes (Signed)
Pt trying to call for a ride

## 2016-07-05 NOTE — ED Notes (Signed)
Pt foot too swollen to apply cam walker. Dr Venora Maples advised to wrap ankle with ace wrap.

## 2016-07-05 NOTE — ED Notes (Signed)
Ortho tech at bedside 

## 2016-10-01 ENCOUNTER — Encounter: Payer: Self-pay | Admitting: Family

## 2016-10-01 ENCOUNTER — Ambulatory Visit (INDEPENDENT_AMBULATORY_CARE_PROVIDER_SITE_OTHER): Payer: Medicare Other | Admitting: Family

## 2016-10-01 VITALS — BP 124/86 | HR 60 | Temp 97.5°F | Ht 74.0 in | Wt 304.0 lb

## 2016-10-01 DIAGNOSIS — E782 Mixed hyperlipidemia: Secondary | ICD-10-CM

## 2016-10-01 DIAGNOSIS — G4733 Obstructive sleep apnea (adult) (pediatric): Secondary | ICD-10-CM

## 2016-10-01 DIAGNOSIS — Z6839 Body mass index (BMI) 39.0-39.9, adult: Secondary | ICD-10-CM | POA: Diagnosis not present

## 2016-10-01 DIAGNOSIS — I10 Essential (primary) hypertension: Secondary | ICD-10-CM

## 2016-10-01 DIAGNOSIS — Z125 Encounter for screening for malignant neoplasm of prostate: Secondary | ICD-10-CM

## 2016-10-01 DIAGNOSIS — E6609 Other obesity due to excess calories: Secondary | ICD-10-CM

## 2016-10-01 DIAGNOSIS — Z0000 Encounter for general adult medical examination without abnormal findings: Secondary | ICD-10-CM | POA: Diagnosis not present

## 2016-10-01 DIAGNOSIS — IMO0001 Reserved for inherently not codable concepts without codable children: Secondary | ICD-10-CM

## 2016-10-01 DIAGNOSIS — Z1211 Encounter for screening for malignant neoplasm of colon: Secondary | ICD-10-CM | POA: Diagnosis not present

## 2016-10-01 DIAGNOSIS — E669 Obesity, unspecified: Secondary | ICD-10-CM | POA: Insufficient documentation

## 2016-10-01 MED ORDER — METOPROLOL SUCCINATE ER 25 MG PO TB24: 25.0000 mg | ORAL_TABLET | Freq: Every day | ORAL | 3 refills | Status: DC

## 2016-10-01 NOTE — Assessment & Plan Note (Signed)
Reviewed and updated patient's medical, surgical, family and social history. Medications and allergies were also reviewed. Basic screenings for depression, activities of daily living, hearing, cognition and safety were performed. Provider list was updated and health plan was provided to the patient.   Obtain PSA for prostate cancer screening. Due for colon cancer screening with referral to gastroenterology placed. Overall adequate exam with risk factors for cardiovascular disease including obstructive sleep apnea, hypertension, hyperlipidemia, sedentary lifestyle, and obesity. Chronic conditions remain labile with current medication regimen of questionable patient compliance with medication regimen secondary to mental status. Recommend weight loss of 5-10% of current body weight to nutrition and physical activity changes. Continue other healthy lifestyle behaviors and choices. Follow-up prevention exam in 1 year. Follow-up office visit pending blood work and for chronic conditions.

## 2016-10-01 NOTE — Progress Notes (Signed)
Subjective:    Patient ID: Roberto Horne, male    DOB: [birth year], 56 y.o.   MRN: [id number]  Chief Complaint  Patient presents with  . Annual Exam    HPI:  Roberto Horne is a 56 y.o. male who presents today for a Medicare Annual Wellness/Physical exam.    1) Health Maintenance -   Diet - Averaging about 2-3 meals per day on average consisting of a regular diet; 2-3 cups of caffeine daily.   Exercise - No regular exercise; Walks on occasion  2) Preventative Exams / Immunizations:  Dental -- Due for exam  Vision -- Due for exam     Health Maintenance  Topic Date Due  . Hepatitis C Screening  Dec 18, 1960  . COLONOSCOPY  04/16/2010  . INFLUENZA VACCINE  01/23/2017  . TETANUS/TDAP  07/07/2023  . HIV Screening  Completed     Immunization History  Administered Date(s) Administered  . Influenza,inj,Quad PF,36+ Mos 07/09/2014, 03/01/2015  . Pneumococcal Polysaccharide-23 07/09/2014    RISK FACTORS  Tobacco History  Smoking Status  . Never Smoker  Smokeless Tobacco  . Never Used     Cardiac risk factors: advanced age (older than 54 for men, 59 for women), dyslipidemia, hypertension, male gender, obesity (BMI >= 30 kg/m2) and sedentary lifestyle.  Depression Screen  Depression screen [facility] 2/9 10/01/2016  Decreased Interest 0  Down, Depressed, Hopeless 0  PHQ - 2 Score 0  Altered sleeping 0  Tired, decreased energy 0  Change in appetite 0  Feeling bad or failure about yourself  0  Trouble concentrating 0  Moving slowly or fidgety/restless 0  Suicidal thoughts 0  PHQ-9 Score 0     Activities of Daily Living In your present state of health, do you have any difficulty performing the following activities?:  Driving? Does not drive Managing money? Yes - needs assistance Feeding yourself? No Getting from bed to chair? No Climbing a flight of stairs? No Preparing food and eating?: No Bathing or showering? No Getting dressed: No Getting to the toilet?  No Using the toilet: No Moving around from place to place: No In the past year have you fallen or had a near fall?:No   Home Safety Has smoke detector and wears seat belts. No firearms. No excess sun exposure. Are there smokers in your home (other than you)?  No Do you feel safe at home?  Yes  Hearing Difficulties: No Do you often ask people to speak up or repeat themselves? No Do you experience ringing or noises in your ears? No  Do you have difficulty understanding soft or whispered voices? No    Cognitive Testing  Alert? Yes   Normal Appearance? Yes  Oriented to person? Yes  Place? Yes   Time? Yes  Recall of three objects?  Yes  Can perform simple calculations? Yes  Displays appropriate judgment? Yes  Can read the correct time from a watch face? Yes  Do you feel that you have a problem with memory? No  Do you often misplace items? No   Advanced Directives have been discussed with the patient? Yes   Current Physicians/Providers and Suppliers  1. Terri Piedra, FNP - Internal Medicine  Indicate any recent Medical Services you may have received from other than [facility] providers in the past year (date may be approximate).  All answers were reviewed with the patient and necessary referrals were made:  Mauricio Po, [place]   10/01/2016    Allergies  Allergen Reactions  .  Ibuprofen Itching     Outpatient Medications Prior to Visit  Medication Sig Dispense Refill  . indomethacin (INDOCIN) 50 MG capsule Take 1 capsule (50 mg total) by mouth 3 (three) times daily as needed. 30 capsule 0  . loratadine (CLARITIN) 10 MG tablet Take 10 mg by mouth daily as needed for allergies.     . metoprolol tartrate (LOPRESSOR) 25 MG tablet TAKE ONE TABLET BY MOUTH TWICE DAILY 180 tablet 0  . omeprazole (PRILOSEC) 20 MG capsule Take 20 mg by mouth daily as needed (for heartburn).     Marland Kitchen PROAIR RESPICLICK 329 (90 BASE) MCG/ACT AEPB INHALE ONE TO TWO PUFFS BY MOUTH INTO THE LUNGS EVERY 4 HOURS AS  NEEDED 1 each 2  . amoxicillin-clavulanate (AUGMENTIN) 875-125 MG tablet Take 1 tablet by mouth every 12 (twelve) hours. 14 tablet 0  . HYDROcodone-acetaminophen (NORCO) 5-325 MG tablet Take 1 tablet by mouth every 6 (six) hours as needed for moderate pain. 8 tablet 0  . predniSONE (DELTASONE) 10 MG tablet Take four tablets by mouth once daily for 7 days, then three tablets once daily for 3 days, then two tablets once daily for 3 days, then one tablet once daily for 3 days. 46 tablet 0   No facility-administered medications prior to visit.      Past Medical History:  Diagnosis Date  . Allergy   . Arthritis   . Asthma   . Depression   . GERD (gastroesophageal reflux disease)   . Gout   . Hyperlipemia   . Hypertension      Past Surgical History:  Procedure Laterality Date  . BRAIN SURGERY    . FUNCTIONAL ENDOSCOPIC SINUS SURGERY  10/14/2015  . SINUS ENDO W/FUSION Bilateral 10/14/2015   Procedure: ENDOSCOPIC SINUS SURGERY WITH NAVIGATION;  Surgeon: Melida Quitter, MD;  Location: [facility] OR;  Service: ENT;  Laterality: Bilateral;     Family History  Problem Relation Age of Onset  . Arthritis Mother   . Hyperlipidemia Mother   . Hypertension Mother   . Diabetes Mother   . Arthritis Father   . Hypertension Father   . Diabetes Father   . Asthma Maternal Grandmother      Social History   Social History  . Marital status: Single    Spouse name: N/A  . Number of children: 0  . Years of education: 12   Occupational History  . Not on file.   Social History Main Topics  . Smoking status: Never Smoker  . Smokeless tobacco: Never Used  . Alcohol use No  . Drug use: No  . Sexual activity: Not on file   Other Topics Concern  . Not on file   Social History Narrative   Fun - everything   Denies religious beliefs effecting health care.      Review of Systems  Constitutional: Denies fever, chills, fatigue, or significant weight gain/loss. HENT: Head: Denies headache or  neck pain Ears: Denies changes in hearing, ringing in ears, earache, drainage Nose: Denies discharge, stuffiness, itching, nosebleed, sinus pain Throat: Denies sore throat, hoarseness, dry mouth, sores, thrush Eyes: Denies loss/changes in vision, pain, redness, blurry/double vision, flashing lights Cardiovascular: Denies chest pain/discomfort, tightness, palpitations, shortness of breath with activity, difficulty lying down, swelling, sudden awakening with shortness of breath Respiratory: Denies shortness of breath, cough, sputum production, wheezing Gastrointestinal: Denies dysphasia, heartburn, change in appetite, nausea, change in bowel habits, rectal bleeding, constipation, diarrhea, yellow skin or eyes Genitourinary: Denies frequency, urgency, burning/pain, blood  in urine, incontinence, change in urinary strength. Musculoskeletal: Denies muscle/joint pain, stiffness, back pain, redness or swelling of joints, trauma Skin: Denies rashes, lumps, itching, dryness, color changes, or hair/nail changes Neurological: Denies dizziness, fainting, seizures, weakness, numbness, tingling, tremor Psychiatric - Denies nervousness, stress, depression or memory loss Endocrine: Denies heat or cold intolerance, sweating, frequent urination, excessive thirst, changes in appetite Hematologic: Denies ease of bruising or bleeding    Objective:    BP 124/86   Pulse 60   Temp 97.5 F (36.4 C) (Oral)   Ht 6\' 2"  (1.88 m)   Wt (!) 304 lb (137.9 kg)   SpO2 98%   BMI 39.03 kg/m  Nursing note and vital signs reviewed.  Physical Exam  Constitutional: He is oriented to person, place, and time. He appears well-developed and well-nourished. No distress.  Cardiovascular: Normal rate, regular rhythm, normal heart sounds and intact distal pulses.   Pulmonary/Chest: Effort normal and breath sounds normal.  Neurological: He is alert and oriented to person, place, and time.  Skin: Skin is warm and dry.  Psychiatric:  He has a normal mood and affect. His behavior is normal. Judgment and thought content normal.       Assessment & Plan:   During the course of the visit the patient was educated and counseled about appropriate screening and preventive services including:    Pneumococcal vaccine   Td vaccine  Prostate cancer screening  Colorectal cancer screening  Diabetes screening  Nutrition counseling   Diet review for nutrition referral? Yes ____  Not Indicated _X___   Patient Instructions (the written plan) was given to the patient.  Medicare Attestation I have personally reviewed: The patient's medical and social history Their use of alcohol, tobacco or illicit drugs Their current medications and supplements The patient's functional ability including ADLs,fall risks, home safety risks, cognitive, and hearing and visual impairment Diet and physical activities Evidence for depression or mood disorders  The patient's weight, height, BMI,  have been recorded in the chart.  I have made referrals, counseling, and provided education to the patient based on review of the above and I have provided the patient with a written personalized care plan for preventive services.     Problem List Items Addressed This Visit      Cardiovascular and Mediastinum   Hypertension    Hypertension appears a click control below goal 140/90 with current medication regimen despite less than optimal patient compliance. Change metformin twice daily to metformin extended release to help simplify medication regimen. Encouraged to monitor blood pressure at home as able. Denies worst headache of life with no new symptoms of end organ damage noted on physical exam. Encouraged to follow low-sodium diet to monitor blood pressure at home.      Relevant Medications   metoprolol succinate (TOPROL-XL) 25 MG 24 hr tablet   Other Relevant Orders   CBC   Comprehensive metabolic panel     Respiratory   Obstructive sleep  apnea    Previous a diagnosed obstructive sleep apnea with recommended follow-up with pulmonology for reinitiation of CPAP. Referral to pulmonology place per patient request. Continue to monitor.      Relevant Orders   Ambulatory referral to Pulmonology     Other   Hyperlipidemia    Obtain lipid profile. Not currently maintained on medication. Continue with lifestyle management pending lipid profile results.      Relevant Medications   metoprolol succinate (TOPROL-XL) 25 MG 24 hr tablet   Other Relevant  Orders   Lipid panel   Medicare annual wellness visit, subsequent    Reviewed and updated patient's medical, surgical, family and social history. Medications and allergies were also reviewed. Basic screenings for depression, activities of daily living, hearing, cognition and safety were performed. Provider list was updated and health plan was provided to the patient.   Obtain PSA for prostate cancer screening. Due for colon cancer screening with referral to gastroenterology placed. Overall adequate exam with risk factors for cardiovascular disease including obstructive sleep apnea, hypertension, hyperlipidemia, sedentary lifestyle, and obesity. Chronic conditions remain labile with current medication regimen of questionable patient compliance with medication regimen secondary to mental status. Recommend weight loss of 5-10% of current body weight to nutrition and physical activity changes. Continue other healthy lifestyle behaviors and choices. Follow-up prevention exam in 1 year. Follow-up office visit pending blood work and for chronic conditions.      Obesity    EMI of 39. Obesity appears consuming factor to sleep apnea, hyperlipidemia, and hypertension as well. Recommend weight loss of 5-10% of current body weight. Recommend increasing physical activity to 30 minutes of moderate level activity daily. Encourage nutritional intake that focuses on nutrient dense foods and is moderate, varied,  and balanced and is low in saturated fats and processed/sugary foods. Continue to monitor.        Other Visit Diagnoses    Screening for prostate cancer    -  Primary   Relevant Orders   PSA, Medicare   Screening for malignant neoplasm of colon       Relevant Orders   Ambulatory referral to Gastroenterology       I have discontinued Mr. Robben predniSONE, amoxicillin-clavulanate, and HYDROcodone-acetaminophen. I am also having him start on metoprolol succinate. Additionally, I am having him maintain his omeprazole, PROAIR RESPICLICK, loratadine, metoprolol tartrate, and indomethacin.   Meds ordered this encounter  Medications  . metoprolol succinate (TOPROL-XL) 25 MG 24 hr tablet    Sig: Take 1 tablet (25 mg total) by mouth daily.    Dispense:  90 tablet    Refill:  3    Order Specific Question:   Supervising Provider    Answer:   Pricilla Holm A [[id number]]     Follow-up: Return in about 1 year (around 10/01/2017), or if symptoms worsen or fail to improve.   Mauricio Po, FNP

## 2016-10-01 NOTE — Progress Notes (Signed)
Pre visit review using our clinic review tool, if applicable. No additional management support is needed unless otherwise documented below in the visit note. 

## 2016-10-01 NOTE — Assessment & Plan Note (Signed)
EMI of 38. Obesity appears consuming factor to sleep apnea, hyperlipidemia, and hypertension as well. Recommend weight loss of 5-10% of current body weight. Recommend increasing physical activity to 30 minutes of moderate level activity daily. Encourage nutritional intake that focuses on nutrient dense foods and is moderate, varied, and balanced and is low in saturated fats and processed/sugary foods. Continue to monitor.

## 2016-10-01 NOTE — Assessment & Plan Note (Signed)
Hypertension appears a click control below goal 140/90 with current medication regimen despite less than optimal patient compliance. Change metformin twice daily to metformin extended release to help simplify medication regimen. Encouraged to monitor blood pressure at home as able. Denies worst headache of life with no new symptoms of end organ damage noted on physical exam. Encouraged to follow low-sodium diet to monitor blood pressure at home.

## 2016-10-01 NOTE — Assessment & Plan Note (Signed)
Obtain lipid profile. Not currently maintained on medication. Continue with lifestyle management pending lipid profile results.

## 2016-10-01 NOTE — Assessment & Plan Note (Signed)
Previous a diagnosed obstructive sleep apnea with recommended follow-up with pulmonology for reinitiation of CPAP. Referral to pulmonology place per patient request. Continue to monitor.

## 2016-10-01 NOTE — Patient Instructions (Signed)
Thank you for choosing [facility].  SUMMARY AND INSTRUCTIONS:  Start taking the metoprolol daily starting with the new medication.   They will call to schedule your appointment for your colonoscopy and sleep study.  For your leg -   Ice x 20 minutes every 2 hours and after activities.  Stretches multiple times per day.  Medication:  Your prescription(s) have been submitted to your pharmacy or been printed and provided for you. Please take as directed and contact our office if you believe you are having problem(s) with the medication(s) or have any questions.  Labs:  Please stop by the lab on the lower level of the building for your blood work. Your results will be released to [place] (or called to you) after review, usually within 72 hours after test completion. If any changes need to be made, you will be notified at that same time.  1.) The lab is open from 7:30am to 5:30 pm Monday-Friday 2.) No appointment is necessary 3.) Fasting (if needed) is 6-8 hours after food and drink; black coffee and water are okay   Referrals:  Referrals have been made during this visit. You should expect to hear back from our schedulers in about 7-10 days in regards to establishing an appointment with the specialists we discussed.   Follow up:  If your symptoms worsen or fail to improve, please contact our office for further instruction, or in case of emergency go directly to the emergency room at the closest medical facility.   Health Maintenance  Topic Date Due  . Hepatitis C Screening  29-May-1961  . COLONOSCOPY  04/16/2010  . INFLUENZA VACCINE  01/23/2017  . TETANUS/TDAP  07/07/2023  . HIV Screening  Completed     Health Maintenance, Male A healthy lifestyle and preventive care is important for your health and wellness. Ask your health care provider about what schedule of regular examinations is right for you. What should I know about weight and diet?  Eat a Healthy Diet  Eat  plenty of vegetables, fruits, whole grains, low-fat dairy products, and lean protein.  Do not eat a lot of foods high in solid fats, added sugars, or salt. Maintain a Healthy Weight  Regular exercise can help you achieve or maintain a healthy weight. You should:  Do at least 150 minutes of exercise each week. The exercise should increase your heart rate and make you sweat (moderate-intensity exercise).  Do strength-training exercises at least twice a week. Watch Your Levels of Cholesterol and Blood Lipids  Have your blood tested for lipids and cholesterol every 5 years starting at 57 years of age. If you are at high risk for heart disease, you should start having your blood tested when you are 57 years old. You may need to have your cholesterol levels checked more often if:  Your lipid or cholesterol levels are high.  You are older than 57 years of age.  You are at high risk for heart disease. What should I know about cancer screening? Many types of cancers can be detected early and may often be prevented. Lung Cancer  You should be screened every year for lung cancer if:  You are a current smoker who has smoked for at least 30 years.  You are a former smoker who has quit within the past 15 years.  Talk to your health care provider about your screening options, when you should start screening, and how often you should be screened. Colorectal Cancer  Routine colorectal cancer  screening usually begins at 57 years of age and should be repeated every 5-10 years until you are 57 years old. You may need to be screened more often if early forms of precancerous polyps or small growths are found. Your health care provider may recommend screening at an earlier age if you have risk factors for colon cancer.  Your health care provider may recommend using home test kits to check for hidden blood in the stool.  A small camera at the end of a tube can be used to examine your colon (sigmoidoscopy  or colonoscopy). This checks for the earliest forms of colorectal cancer. Prostate and Testicular Cancer  Depending on your age and overall health, your health care provider may do certain tests to screen for prostate and testicular cancer.  Talk to your health care provider about any symptoms or concerns you have about testicular or prostate cancer. Skin Cancer  Check your skin from head to toe regularly.  Tell your health care provider about any new moles or changes in moles, especially if:  There is a change in a mole's size, shape, or color.  You have a mole that is larger than a pencil eraser.  Always use sunscreen. Apply sunscreen liberally and repeat throughout the day.  Protect yourself by wearing long sleeves, pants, a wide-brimmed hat, and sunglasses when outside. What should I know about heart disease, diabetes, and high blood pressure?  If you are 34-11 years of age, have your blood pressure checked every 3-5 years. If you are 56 years of age or older, have your blood pressure checked every year. You should have your blood pressure measured twice-once when you are at a hospital or clinic, and once when you are not at a hospital or clinic. Record the average of the two measurements. To check your blood pressure when you are not at a hospital or clinic, you can use:  An automated blood pressure machine at a pharmacy.  A home blood pressure monitor.  Talk to your health care provider about your target blood pressure.  If you are between 33-30 years old, ask your health care provider if you should take aspirin to prevent heart disease.  Have regular diabetes screenings by checking your fasting blood sugar level.  If you are at a normal weight and have a low risk for diabetes, have this test once every three years after the age of 80.  If you are overweight and have a high risk for diabetes, consider being tested at a younger age or more often.  A one-time screening for  abdominal aortic aneurysm (AAA) by ultrasound is recommended for men aged 30-75 years who are current or former smokers. What should I know about preventing infection? Hepatitis B  If you have a higher risk for hepatitis B, you should be screened for this virus. Talk with your health care provider to find out if you are at risk for hepatitis B infection. Hepatitis C  Blood testing is recommended for:  Everyone born from [birth year] through 1966.  Anyone with known risk factors for hepatitis C. Sexually Transmitted Diseases (STDs)  You should be screened each year for STDs including gonorrhea and chlamydia if:  You are sexually active and are younger than 57 years of age.  You are older than 56 years of age and your health care provider tells you that you are at risk for this type of infection.  Your sexual activity has changed since you were last screened and you  are at an increased risk for chlamydia or gonorrhea. Ask your health care provider if you are at risk.  Talk with your health care provider about whether you are at high risk of being infected with HIV. Your health care provider may recommend a prescription medicine to help prevent HIV infection. What else can I do?  Schedule regular health, dental, and eye exams.  Stay current with your vaccines (immunizations).  Do not use any tobacco products, such as cigarettes, chewing tobacco, and e-cigarettes. If you need help quitting, ask your health care provider.  Limit alcohol intake to no more than 2 drinks per day. One drink equals 12 ounces of beer, 5 ounces of wine, or 1 ounces of hard liquor.  Do not use street drugs.  Do not share needles.  Ask your health care provider for help if you need support or information about quitting drugs.  Tell your health care provider if you often feel depressed.  Tell your health care provider if you have ever been abused or do not feel safe at home. This information is not intended to  replace advice given to you by your health care provider. Make sure you discuss any questions you have with your health care provider. Document Released: 12/08/2007 Document Revised: 02/08/2016 Document Reviewed: 03/15/2015 Elsevier Interactive Patient Education  2017 [employer] Ask your health care provider which exercises are safe for you. Do exercises exactly as told by your health care provider and adjust them as directed. It is normal to feel mild stretching, pulling, tightness, or discomfort as you do these exercises, but you should stop right away if you feel sudden pain or your pain gets worse.Do not begin these exercises until told by your health care provider. Strengthening exercises These exercises build strength and endurance in your thighs. Endurance is the ability to use your muscles for a long time, even after your muscles get tired. Exercise A: Straight leg raises (  hip extensors) 1. Lie on your belly on a firm surface. 2. Tense the muscles in your buttocks to lift your left / right leg about 4 inches (10 cm). Keep your knee straight. 3. If you cannot lift your leg this high without arching your back, place a pillow under your hips. 4. Hold the position for __________ seconds. 5. Slowly lower your leg to the starting position and allow it to relax completely before you start the next repetition. Repeat __________ times. Complete this exercise __________ times a day. Exercise B: Bridge ( hip extensors) 1. Lie on your back on a firm surface with your knees bent and your feet flat on the floor. 2. Tighten your buttocks muscles and lift your bottom off the floor until your trunk is level with your thighs.  You should feel the muscles working in your buttocks and the back of your thighs.  Do not arch your back. 3. Hold this position for __________ seconds. 4. Slowly lower your hips to the starting position. 5. Let your buttocks muscles relax  completely between repetitions. Repeat __________ times. Complete this exercise __________ times a day. If told by your health care provider, keep your bottom lifted off the floor while you slowly walk your feet away from you as far as you can control. Hold for __________ seconds, then slowly walk your feet back toward you. Exercise C: Lateral walking with band ( hip abductors) 1. Stand in a long hallway. 2. Wrap a loop of exercise band around  your legs, just above your knees. 3. Bend your knees gently and drop your hips down and back so your weight is over your heels. 4. Step to the side to move down the length of the hallway, keeping your toes pointed ahead of you and keeping tension in the band. 5. Repeat, leading with your other leg. Repeat __________ times. Complete this exercise __________ times a day. Exercise D: Single leg stand with reaching ( eccentric hamstring) 1. Stand on your left / right foot. Keep your big toe down on the floor and try to keep your arch lifted. 2. Slowly reach down toward the floor as far as you can while keeping your balance. 3. Hold this position for __________ seconds. Repeat __________ times. Complete this exercise __________ times a day. Exercise E: Prone plank ( abdominals and core) 1. Lie on your belly on the floor and prop yourself up on your elbows. Your hands should be straight out in front of you, and your elbows should be below your shoulders. Position your feet so the balls of your feet touch the ground. The ball of your foot is on the walking surface, right under your toes. 2. Tighten your abdominal muscles and lift your body off the floor.  Do not arch your back.  Do not hold your breath. 3. Hold this position for __________ seconds. Repeat __________ times. Complete this exercise __________ times a day. This information is not intended to replace advice given to you by your health care provider. Make sure you discuss any questions you have  with your health care provider. Document Released: 06/11/2005 Document Revised: 02/16/2016 Document Reviewed: 03/10/2015 Elsevier Interactive Patient Education  2017 [employer].

## 2016-10-11 ENCOUNTER — Encounter: Payer: Self-pay | Admitting: Nurse Practitioner

## 2016-10-23 ENCOUNTER — Ambulatory Visit (INDEPENDENT_AMBULATORY_CARE_PROVIDER_SITE_OTHER): Payer: Medicare Other | Admitting: Nurse Practitioner

## 2016-10-23 ENCOUNTER — Encounter: Payer: Self-pay | Admitting: Nurse Practitioner

## 2016-10-23 VITALS — BP 118/70 | HR 60 | Ht 70.47 in | Wt 305.0 lb

## 2016-10-23 DIAGNOSIS — Z1211 Encounter for screening for malignant neoplasm of colon: Secondary | ICD-10-CM

## 2016-10-23 DIAGNOSIS — K219 Gastro-esophageal reflux disease without esophagitis: Secondary | ICD-10-CM

## 2016-10-23 MED ORDER — NA SULFATE-K SULFATE-MG SULF 17.5-3.13-1.6 GM/177ML PO SOLN: 1.0000 | Freq: Once | ORAL | 0 refills | Status: AC

## 2016-10-23 NOTE — Patient Instructions (Signed)
If you are age 57 or older, your body mass index should be between 23-30. Your Body mass index is 43.18 kg/m. If this is out of the aforementioned range listed, please consider follow up with your Primary Care Provider.  If you are age 72 or younger, your body mass index should be between 19-25. Your Body mass index is 43.18 kg/m. If this is out of the aformentioned range listed, please consider follow up with your Primary Care Provider.   You have been scheduled for a colonoscopy. Please follow written instructions given to you at your visit today.  Please pick up your prep supplies at the pharmacy within the next 1-3 days. If you use inhalers (even only as needed), please bring them with you on the day of your procedure. Your physician has requested that you go to www.startemmi.com and enter the access code given to you at your visit today. This web site gives a general overview about your procedure. However, you should still follow specific instructions given to you by our office regarding your preparation for the procedure.   You may have a light breakfast the morning of prep day (the day before the procedure).   You may choose from one of the following items: eggs and toast or chicken noodle soup and crackers.   You should have your breakfast completed between 8:00 and 9:00 am the day before your procedure.  After you have had your light breakfast you should start a clear liquid dietonly NO SOLIDS. No additional solid food is allowed. You may continue to have clear liquid up to 3 hours prior to your procedure.   Thank you for choosing me and [facility].  Tye Savoy, NP

## 2016-10-23 NOTE — Progress Notes (Signed)
HPI:  Patient is a 56 year old male, new to this practice, referred by Gaetano Hawthorne, FNP for colon cancer screening. Patient has mild mental retardation, he is here with his sister. Patient has no GI complaints. Specifically, no bowel changes, abdominal pain, or blood in stool. He does have a long history of GERD, asymptomatic on PPI. His weight has been stable, sister is trying to help him lose weight.  Labs in mid January revealed hemoglobin of 13.3, MCV 76, normal renal function   Past Medical History:  Diagnosis Date  . Allergy   . Arthritis   . Asthma   . Depression   . GERD (gastroesophageal reflux disease)   . Gout   . Hyperlipemia   . Hypertension     Past Surgical History:  Procedure Laterality Date  . BRAIN SURGERY    . FUNCTIONAL ENDOSCOPIC SINUS SURGERY  10/14/2015  . SINUS ENDO W/FUSION Bilateral 10/14/2015   Procedure: ENDOSCOPIC SINUS SURGERY WITH NAVIGATION;  Surgeon: Melida Quitter, MD;  Location: [facility] OR;  Service: ENT;  Laterality: Bilateral;   Family History  Problem Relation Age of Onset  . Arthritis Mother   . Hyperlipidemia Mother   . Hypertension Mother   . Diabetes Mother   . Stomach cancer Mother     possible, not 100 % sure  . Arthritis Father   . Hypertension Father   . Diabetes Father   . Asthma Maternal Grandmother   . Rectal cancer Neg Hx   . Colon cancer Neg Hx    Social History  Substance Use Topics  . Smoking status: Never Smoker  . Smokeless tobacco: Never Used  . Alcohol use No   Current Outpatient Prescriptions  Medication Sig Dispense Refill  . loratadine (CLARITIN) 10 MG tablet Take 10 mg by mouth daily as needed for allergies.     . metoprolol succinate (TOPROL-XL) 25 MG 24 hr tablet Take 1 tablet (25 mg total) by mouth daily. 90 tablet 3  . omeprazole (PRILOSEC OTC) 20 MG tablet Take 20 mg by mouth daily.    Marland Kitchen omeprazole (PRILOSEC) 20 MG capsule Take 20 mg by mouth daily as needed (for heartburn).     Marland Kitchen PROAIR  RESPICLICK 825 (90 BASE) MCG/ACT AEPB INHALE ONE TO TWO PUFFS BY MOUTH INTO THE LUNGS EVERY 4 HOURS AS NEEDED 1 each 2   No current facility-administered medications for this visit.    Allergies  Allergen Reactions  . Ibuprofen Itching     Review of Systems: Positive for allergy, sinus trouble, arthritis, cough, muscle pains and cramps, shortness of breath. All other systems reviewed and negative except where noted in HPI.    Physical Exam: BP 118/70   Pulse 60   Ht 5' 10.47" (1.79 m) Comment: w/o shoes  Wt (!) 305 lb (138.3 kg)   BMI 43.18 kg/m  Constitutional:  Obese black male in no acute distress. Psychiatric: Pleasant. Normal mood and affect. Behavior is normal. EENT:  Conjunctivae are normal. No scleral icterus. Neck supple.  Cardiovascular: Normal rate, regular rhythm.  Pulmonary/chest: Effort normal and breath sounds normal. No wheezing, rales or rhonchi. Abdominal: Soft, nondistended, nontender. Bowel sounds active throughout. There are no masses palpable. No hepatomegaly. Extremities: no edema Lymphadenopathy: No cervical adenopathy noted. Neurological: Alert and oriented to person place and time. Skin: Skin is warm and dry. No rashes noted.   ASSESSMENT AND PLAN:  1. Pleasant 56 year old black male referred for colon cancer screening. He has no GI complaints. -  Patient will be scheduled for a screening colonoscopy with possible polypectomy.  The risks and benefits of the procedure were discussed and the patient agrees to proceed.   2. GERD, asymptomatic on PPI.   3. Mild mental retardation, ? Brain injury.   4. Questionable history of stomach cancer in mother. Family never able to confirm. If ever confirmed then patient may need screening EGD.    Tye Savoy, NP  10/23/2016, 2:59 PM  Cc:  [place], FNP

## 2016-10-31 NOTE — Progress Notes (Signed)
Reviewed and agree with documentation and assessment and plan. K. Veena Kaylyn Garrow , MD   

## 2016-11-07 DIAGNOSIS — Z0279 Encounter for issue of other medical certificate: Secondary | ICD-10-CM

## 2016-12-03 ENCOUNTER — Ambulatory Visit (INDEPENDENT_AMBULATORY_CARE_PROVIDER_SITE_OTHER): Payer: Medicare Other | Admitting: Internal Medicine

## 2016-12-03 ENCOUNTER — Encounter: Payer: Self-pay | Admitting: Internal Medicine

## 2016-12-03 VITALS — BP 122/68 | HR 60 | Resp 18 | Ht 72.2 in | Wt 295.0 lb

## 2016-12-03 DIAGNOSIS — K029 Dental caries, unspecified: Secondary | ICD-10-CM

## 2016-12-03 DIAGNOSIS — J339 Nasal polyp, unspecified: Secondary | ICD-10-CM | POA: Diagnosis not present

## 2016-12-03 DIAGNOSIS — G4733 Obstructive sleep apnea (adult) (pediatric): Secondary | ICD-10-CM

## 2016-12-03 DIAGNOSIS — F79 Unspecified intellectual disabilities: Secondary | ICD-10-CM | POA: Diagnosis not present

## 2016-12-03 NOTE — Assessment & Plan Note (Signed)
Nasal polypectomy 2017. I don't see anterior polyps now and he doesn't seem obstructed

## 2016-12-03 NOTE — Assessment & Plan Note (Signed)
Appropriate to retest. He has gained weight. Sister-in-law I think can supervise him more closely now. Plan-sleep study

## 2016-12-03 NOTE — Assessment & Plan Note (Signed)
He is cooperative but simple and childlike with no insight. He will need supervision to be successful with any intervention for sleep apnea.

## 2016-12-03 NOTE — Progress Notes (Signed)
HPI male never smoker, special needs,  followed for OSA/noncompliant with CPAP,  allergic rhinitis,, nasal polyps( triad),  HBP, asthma, complicated by obesity, Allergy NSAIDs/ibuprofen itching   -------------------------------------------------------------------------------------------------------------------------------------  07/21/2015-56 year old male never smoker referred courtesy of Mauricio Po; sleep study done 1983 at [facility] here. I had worked with him many years ago, making diagnosis of OSA with sleep study at that time. He remembers disliking CPAP "too tight". Since then has gained 15 or 20 pounds. Admits daytime sleepiness. They can't verify loud snoring or witnessed apnea. His major complaint is persistent, perennial nasal congestion. Uses Flonase and nasal saline sprays, occasional Zyrtec. Nose sometimes runs. No purulent discharge. Medical problems include high blood pressure. Asthma with use of rescue inhaler "sometimes".  09/26/2015-56 year old male never smoker, (special needs) followed for OSA/noncompliant with CPAP,  allergic rhinitis,, nasal polyps( triad),  HBP, asthma, complicated by obesity, Allergy NSAIDs/ibuprofen itching FOLLOW FOR: Nasal Polyps.  sinus congestion, sinus pressure, headaches. He lives with sister-in-law who brings him in today, concerned about his loud snoring and daytime sleepiness. She doesn't witness his breathing at night. He has gained weight in recent years. Denies wheezing. Up occasionally once at night to bathroom but no parasomnias. Nasal polyp surgery last year CT max fac 07/29/2015- IMPRESSION: 1. Opacified nasal cavity and nasopharynx with polypoid lesion suggested measuring at least 2.6 and up to 3.6 cm. 2. Subtotal opacification of the bilateral maxillary, ethmoid, and frontal sinuses. Bubbly opacity in the right sphenoid sinus compatible with acute sinusitis. 3. The constellation favors sinonasal  polyposis. Electronically Signed  By: Genevie Ann M.D.  On: 07/29/2015 17:07 CBC differential 07/21/2015-normal eos, total IgE 298 but normal environmental IgE allergy panel  12/03/16- 56 year old male never smoker followed for OSA/noncompliant with CPAP,  allergic rhinitis,, nasal polyps( triad),  HBP, asthma, complicated by obesity, Allergy NSAIDs/ibuprofen itching patient is in need of a sleep study. patient snors very loudly.  patient only sleeps about 3 hours at a time if the snoring dosen't wake you the bathroom does  Sister-in-law describes loud snoring and he admits daytime sleepiness. Previous evaluation failed because he wouldn't wear CPAP.  ROS-see HPI   Negative unless "+" Constitutional:    weight loss, night sweats, fevers, chills, fatigue, lassitude. HEENT:    headaches, difficulty swallowing, tooth/dental problems, sore throat,       sneezing, itching, ear ache, + nasal congestion, post nasal drip, snoring CV:    chest pain, orthopnea, PND, swelling in lower extremities, anasarca,                                                             dizziness, palpitations Resp:   shortness of breath with exertion or at rest.                productive cough,   non-productive cough, coughing up of blood.              change in color of mucus.  wheezing.   Skin:    rash or lesions. GI:  No-   heartburn, indigestion, abdominal pain, nausea, vomiting, diarrhea,                 change in bowel habits, loss of appetite GU: dysuria, change in color of urine, no urgency or frequency.  flank pain. MS:   joint pain, stiffness, decreased range of motion, back pain. Neuro-     nothing unusual Psych:  change in mood or affect.  depression or anxiety.   memory loss.  OBJ- Physical Exam General- Alert, Oriented, Affect-appropriate, Distress- none acute, + appears to be "special needs", childish affect Skin- rash-none, lesions- none, excoriation- none Lymphadenopathy- none Head- atraumatic             Eyes- Gross vision intact, PERRLA, conjunctivae and secretions clear            Ears- Hearing, canals-normal            Nose- Clear, no-Septal dev, mucus, polyps +, erosion, perforation             Throat- Mallampati III , mucosa clear , drainage- none, tonsils- atrophic, + very bed teeth Neck- flexible , trachea midline, no stridor , thyroid nl, carotid no bruit Chest - symmetrical excursion , unlabored           Heart/CV- RRR , no murmur , no gallop  , no rub, nl s1 s2                           - JVD- none , edema- none, stasis changes- none, varices- none           Lung- clear to P&A, wheeze- none, cough- none , dullness-none, rub- none           Chest wall-  Abd-  Br/ Gen/ Rectal- Not done, not indicated Extrem- cyanosis- none, clubbing, none, atrophy- none, strength- nl Neuro- grossly intact to observation

## 2016-12-03 NOTE — Patient Instructions (Signed)
Order- schedule unattended home sleep test     Dx OSA  Roberto Horne I recommend you see a dentist about your bad teeth  My staff will schedule a return visit to go over results of the sleep test

## 2016-12-03 NOTE — Assessment & Plan Note (Signed)
Many bad and missing teeth. He probably needs full extractions. I suggested to his sister-in-law that she take him to the dentist

## 2016-12-05 ENCOUNTER — Encounter: Payer: Medicare Other | Admitting: Gastroenterology

## 2016-12-06 ENCOUNTER — Telehealth: Payer: Self-pay | Admitting: *Deleted

## 2016-12-06 MED ORDER — ALBUTEROL SULFATE 108 (90 BASE) MCG/ACT IN AEPB: 2.0000 | INHALATION_SPRAY | Freq: Four times a day (QID) | RESPIRATORY_TRACT | 2 refills | Status: DC | PRN

## 2016-12-06 NOTE — Telephone Encounter (Signed)
[facility] [address].

## 2016-12-06 NOTE — Telephone Encounter (Signed)
[facility]...Johny Chess

## 2016-12-06 NOTE — Telephone Encounter (Signed)
Wife left msg on triage stating husband is needing refill on his Proair inhaler. He is having a procedure next week and will need to have. Med hasn't been filled since 10/2014 pls advise if ok to refill...Johny Chess

## 2016-12-06 NOTE — Telephone Encounter (Signed)
Done erx 

## 2016-12-06 NOTE — Telephone Encounter (Signed)
Roberto Horne is out of office pls advise on msg below...Roberto Horne

## 2016-12-07 ENCOUNTER — Ambulatory Visit (AMBULATORY_SURGERY_CENTER): Payer: Medicare Other | Admitting: Gastroenterology

## 2016-12-07 ENCOUNTER — Encounter: Payer: Self-pay | Admitting: Gastroenterology

## 2016-12-07 VITALS — BP 133/99 | HR 67 | Temp 97.1°F | Resp 12 | Ht 70.0 in | Wt 305.0 lb

## 2016-12-07 DIAGNOSIS — Z1211 Encounter for screening for malignant neoplasm of colon: Secondary | ICD-10-CM

## 2016-12-07 DIAGNOSIS — K621 Rectal polyp: Secondary | ICD-10-CM | POA: Diagnosis not present

## 2016-12-07 DIAGNOSIS — D124 Benign neoplasm of descending colon: Secondary | ICD-10-CM | POA: Diagnosis not present

## 2016-12-07 DIAGNOSIS — D128 Benign neoplasm of rectum: Secondary | ICD-10-CM | POA: Diagnosis not present

## 2016-12-07 DIAGNOSIS — Z1212 Encounter for screening for malignant neoplasm of rectum: Secondary | ICD-10-CM | POA: Diagnosis not present

## 2016-12-07 MED ORDER — SODIUM CHLORIDE 0.9 % IV SOLN: 500.0000 mL | INTRAVENOUS | Status: DC

## 2016-12-07 NOTE — Progress Notes (Signed)
A and O x3. Report to RN. Tolerated MAC anesthesia well.

## 2016-12-07 NOTE — Patient Instructions (Signed)
YOU HAD AN ENDOSCOPIC PROCEDURE TODAY AT [facility]:   Refer to the procedure report that was given to you for any specific questions about what was found during the examination.  If the procedure report does not answer your questions, please call your gastroenterologist to clarify.  If you requested that your care partner not be given the details of your procedure findings, then the procedure report has been included in a sealed envelope for you to review at your convenience later.  YOU SHOULD EXPECT: Some feelings of bloating in the abdomen. Passage of more gas than usual.  Walking can help get rid of the air that was put into your GI tract during the procedure and reduce the bloating. If you had a lower endoscopy (such as a colonoscopy or flexible sigmoidoscopy) you may notice spotting of blood in your stool or on the toilet paper. If you underwent a bowel prep for your procedure, you may not have a normal bowel movement for a few days.  Please Note:  You might notice some irritation and congestion in your nose or some drainage.  This is from the oxygen used during your procedure.  There is no need for concern and it should clear up in a day or so.  SYMPTOMS TO REPORT IMMEDIATELY:   Following lower endoscopy (colonoscopy or flexible sigmoidoscopy):  Excessive amounts of blood in the stool  Significant tenderness or worsening of abdominal pains  Swelling of the abdomen that is new, acute  Fever of 100F or higher    For urgent or emergent issues, a gastroenterologist can be reached at any hour by calling [phone].   DIET:  We do recommend a small meal at first, but then you may proceed to your regular diet.  Drink plenty of fluids but you should avoid alcoholic beverages for 24 hours.  ACTIVITY:  You should plan to take it easy for the rest of today and you should NOT DRIVE or use heavy machinery until tomorrow (because of the sedation medicines used during the test).     FOLLOW UP: Our staff will call the number listed on your records the next business day following your procedure to check on you and address any questions or concerns that you may have regarding the information given to you following your procedure. If we do not reach you, we will leave a message.  However, if you are feeling well and you are not experiencing any problems, there is no need to return our call.  We will assume that you have returned to your regular daily activities without incident.  If any biopsies were taken you will be contacted by phone or by letter within the next 1-3 weeks.  Please call us at [phone] if you have not heard about the biopsies in 3 weeks.    SIGNATURES/CONFIDENTIALITY: You and/or your care partner have signed paperwork which will be entered into your electronic medical record.  These signatures attest to the fact that that the information above on your After Visit Summary has been reviewed and is understood.  Full responsibility of the confidentiality of this discharge information lies with you and/or your care-partner.   Information on polyps & hemorrhoids given to you today  Await pathology results

## 2016-12-07 NOTE — Op Note (Signed)
[facility] Patient Name: Roberto Horne Procedure Date: 12/07/2016 1:42 PM MRN: [id number] Endoscopist: Mauri Pole , MD Age: 56 Referring MD:  Date of Birth: 05/06/1961 Gender: Male Account #: [id number] Procedure:                Colonoscopy Indications:              Screening for colorectal malignant neoplasm Medicines:                Monitored Anesthesia Care Procedure:                Pre-Anesthesia Assessment:                           - Prior to the procedure, a History and Physical                            was performed, and patient medications and                            allergies were reviewed. The patient's tolerance of                            previous anesthesia was also reviewed. The risks                            and benefits of the procedure and the sedation                            options and risks were discussed with the patient.                            All questions were answered, and informed consent                            was obtained. Prior Anticoagulants: The patient has                            taken no previous anticoagulant or antiplatelet                            agents. ASA Grade Assessment: II - A patient with                            mild systemic disease. After reviewing the risks                            and benefits, the patient was deemed in                            satisfactory condition to undergo the procedure.                           After obtaining informed consent, the colonoscope  was passed under direct vision. Throughout the                            procedure, the patient's blood pressure, pulse, and                            oxygen saturations were monitored continuously. The                            Colonoscope was introduced through the anus and                            advanced to the the cecum, identified by                            appendiceal orifice  and ileocecal valve. The                            colonoscopy was performed without difficulty. The                            patient tolerated the procedure well. The quality                            of the bowel preparation was excellent. The                            ileocecal valve, appendiceal orifice, and rectum                            were photographed. Scope In: 1:50:46 PM Scope Out: 2:10:17 PM Scope Withdrawal Time: 0 hours 12 minutes 42 seconds  Total Procedure Duration: 0 hours 19 minutes 31 seconds  Findings:                 The perianal and digital rectal examinations were                            normal.                           A 5 mm polyp was found in the descending colon. The                            polyp was sessile. The polyp was removed with a                            cold snare. Resection and retrieval were complete.                           A 3 mm polyp was found in the rectum. The polyp was                            sessile. The polyp was removed with a cold biopsy  forceps. Resection and retrieval were complete.                           Non-bleeding internal hemorrhoids were found during                            retroflexion. The hemorrhoids were small.                           The exam was otherwise without abnormality. Complications:            No immediate complications. Estimated Blood Loss:     Estimated blood loss was minimal. Impression:               - One 5 mm polyp in the descending colon, removed                            with a cold snare. Resected and retrieved.                           - One 3 mm polyp in the rectum, removed with a cold                            biopsy forceps. Resected and retrieved.                           - Non-bleeding internal hemorrhoids.                           - The examination was otherwise normal. Recommendation:           - Patient has a contact number available for                             emergencies. The signs and symptoms of potential                            delayed complications were discussed with the                            patient. Return to normal activities tomorrow.                            Written discharge instructions were provided to the                            patient.                           - Resume previous diet.                           - Continue present medications.                           - Await pathology results.                           -  Repeat colonoscopy in 5-10 years for surveillance                            based on pathology results. Mauri Pole, MD 12/07/2016 2:13:52 PM This report has been signed electronically.

## 2016-12-07 NOTE — Progress Notes (Signed)
Called to room to assist during endoscopic procedure.  Patient ID and intended procedure confirmed with present staff. Received instructions for my participation in the procedure from the performing physician.  

## 2016-12-10 ENCOUNTER — Telehealth: Payer: Self-pay

## 2016-12-10 NOTE — Telephone Encounter (Signed)
Called [phone] sister- in-law to pt and left a messaged we tried to reach pt for a follow up call. maw

## 2016-12-10 NOTE — Telephone Encounter (Deleted)
No answer, left voicemail. Follow up Call-  Call back number 12/07/2016  Post procedure Call Back phone  # [phone] # sheis care giver ok to speak with her  Permission to leave phone message Yes  Some recent data might be hidden     Patient questions:  Do you have a fever, pain , or abdominal swelling? {yes no:314532} Pain Score  {NUMBERS; 0-10:5044} *  Have you tolerated food without any problems? {yes no:314532}  Have you been able to return to your normal activities? {yes no:314532}  Do you have any questions about your discharge instructions: Diet   {yes no:314532} Medications  {yes no:314532} Follow up visit  {yes no:314532}  Do you have questions or concerns about your Care? {yes no:314532}  Actions: * If pain score is 4 or above: {ACTION; [facility] ENDO PAIN >4:21563::"No action needed, pain <4."}

## 2016-12-10 NOTE — Telephone Encounter (Signed)
Left a message

## 2016-12-11 ENCOUNTER — Encounter: Payer: Self-pay | Admitting: Gastroenterology

## 2017-01-19 DIAGNOSIS — G4733 Obstructive sleep apnea (adult) (pediatric): Secondary | ICD-10-CM | POA: Diagnosis not present

## 2017-01-22 ENCOUNTER — Other Ambulatory Visit: Payer: Self-pay | Admitting: *Deleted

## 2017-01-22 DIAGNOSIS — G4733 Obstructive sleep apnea (adult) (pediatric): Secondary | ICD-10-CM

## 2017-01-30 ENCOUNTER — Encounter (HOSPITAL_COMMUNITY): Payer: Self-pay | Admitting: *Deleted

## 2017-01-30 ENCOUNTER — Emergency Department (HOSPITAL_COMMUNITY)
Admission: EM | Admit: 2017-01-30 | Discharge: 2017-01-30 | Disposition: A | Discharge status: Home / Self Care | Payer: Medicare Other | Attending: Emergency Medicine | Admitting: Emergency Medicine

## 2017-01-30 DIAGNOSIS — Z79899 Other long term (current) drug therapy: Secondary | ICD-10-CM | POA: Diagnosis not present

## 2017-01-30 DIAGNOSIS — J45909 Unspecified asthma, uncomplicated: Secondary | ICD-10-CM | POA: Insufficient documentation

## 2017-01-30 DIAGNOSIS — S39012A Strain of muscle, fascia and tendon of lower back, initial encounter: Secondary | ICD-10-CM | POA: Insufficient documentation

## 2017-01-30 DIAGNOSIS — E785 Hyperlipidemia, unspecified: Secondary | ICD-10-CM | POA: Diagnosis not present

## 2017-01-30 DIAGNOSIS — Y9389 Activity, other specified: Secondary | ICD-10-CM | POA: Insufficient documentation

## 2017-01-30 DIAGNOSIS — Y999 Unspecified external cause status: Secondary | ICD-10-CM | POA: Diagnosis not present

## 2017-01-30 DIAGNOSIS — I10 Essential (primary) hypertension: Secondary | ICD-10-CM | POA: Diagnosis not present

## 2017-01-30 DIAGNOSIS — X58XXXA Exposure to other specified factors, initial encounter: Secondary | ICD-10-CM | POA: Insufficient documentation

## 2017-01-30 DIAGNOSIS — Y929 Unspecified place or not applicable: Secondary | ICD-10-CM | POA: Insufficient documentation

## 2017-01-30 DIAGNOSIS — S3982XA Other specified injuries of lower back, initial encounter: Secondary | ICD-10-CM | POA: Diagnosis present

## 2017-01-30 NOTE — ED Provider Notes (Signed)
[facility] DEPT Provider Note   CSN: [id number] Arrival date & time: 01/30/17  1022  By signing my name below, I, Reola Mosher, attest that this documentation has been prepared under the direction and in the presence of Margarita Mail, PA-C.  Electronically Signed: Reola Mosher, ED Scribe. 01/30/17. 2:25 PM.  History   Chief Complaint Chief Complaint  Patient presents with  . Back Pain   The history is provided by the patient. No language interpreter was used.    HPI Comments: Roberto Horne is a 56 y.o. male who presents to the Emergency Department complaining of intermittent, non-radiating, left-sided lower back pain beginning approximately one week ago. No known trauma or injury to the back. No recent heavy lifting or abnormal twisting. His pain is worse with generalized movements and improved upon staying still. He has been taking Tylenol this morning without significant relief of his pain. Additionally, he reports that he has been unable to sleep well since the onset of his pain d/t his pain. No h/o renal calculi. He denies dysuria, hematuria, gait abnormality, weakness, numbness, bowel/bladder incontinence, fever, or any other associated symptoms.  Past Medical History:  Diagnosis Date  . Allergy   . Arthritis   . Asthma   . Depression   . GERD (gastroesophageal reflux disease)   . Gout   . Hyperlipemia   . Hypertension   . Mental impairment   . Pneumonia   . Sleep apnea    Patient Active Problem List   Diagnosis Date Noted  . Mental retardation 12/03/2016  . Dental caries 12/03/2016  . Obesity 10/01/2016  . Left foot pain 08/30/2015  . Acute upper respiratory infection 08/30/2015  . Nasal polyps 07/21/2015  . Contusion, arm, upper 03/01/2015  . Hand contusion 03/01/2015  . Obstructive sleep apnea 12/16/2014  . Seasonal and perennial allergic rhinitis 09/21/2014  . Medicare annual wellness visit, subsequent 09/07/2014  . CAP (community acquired  pneumonia) 07/07/2014  . Effusion of right knee 07/07/2014  . Hypertension 07/07/2014  . Abdominal pain 07/07/2014  . Pneumonia 07/07/2014  . Asthma, mild persistent 12/15/2012  . Gout, unspecified 12/15/2012  . Esophageal reflux 12/15/2012  . HTN (hypertension) 11/06/2012  . Hyperlipidemia 11/06/2012   Past Surgical History:  Procedure Laterality Date  . BRAIN SURGERY    . FUNCTIONAL ENDOSCOPIC SINUS SURGERY  10/14/2015  . SINUS ENDO W/FUSION Bilateral 10/14/2015   Procedure: ENDOSCOPIC SINUS SURGERY WITH NAVIGATION;  Surgeon: Melida Quitter, MD;  Location: [facility];  Service: ENT;  Laterality: Bilateral;    Home Medications    Prior to Admission medications   Medication Sig Start Date End Date Taking? Authorizing Provider  Albuterol Sulfate (PROAIR RESPICLICK) 409 (90 Base) MCG/ACT AEPB Inhale 2 puffs into the lungs 4 (four) times daily as needed. 12/06/16   Biagio Borg, MD  fluticasone Asencion Islam) 50 MCG/ACT nasal spray  10/17/16   [provider]  loratadine (CLARITIN) 10 MG tablet Take 10 mg by mouth daily as needed for allergies.     [provider]  metoprolol succinate (TOPROL-XL) 25 MG 24 hr tablet Take 1 tablet (25 mg total) by mouth daily. 10/01/16   Golden Circle, FNP  omeprazole (PRILOSEC OTC) 20 MG tablet Take 20 mg by mouth daily.    [provider]   Family History Family History  Problem Relation Age of Onset  . Arthritis Mother   . Hyperlipidemia Mother   . Hypertension Mother   . Diabetes Mother   .  Stomach cancer Mother        possible, not 100 % sure  . Kidney disease Mother   . Arthritis Father   . Hypertension Father   . Diabetes Father   . Asthma Maternal Grandmother   . Rectal cancer Neg Hx   . Colon cancer Neg Hx    Social History Social History  Substance Use Topics  . Smoking status: Never Smoker  . Smokeless tobacco: Never Used  . Alcohol use No   Allergies   Ibuprofen  Review of Systems Review of Systems A  complete review of systems was obtained and all systems are negative except as noted in the HPI and PMH.   Physical Exam Updated Vital Signs BP 106/87 (BP Location: Right Arm)   Pulse 80   Temp 97.8 F (36.6 C) (Oral)   Resp 18   SpO2 98%   Physical Exam  Constitutional: He appears well-developed and well-nourished. No distress.  HENT:  Head: Normocephalic and atraumatic.  Eyes: Conjunctivae are normal.  Neck: Normal range of motion.  Cardiovascular: Normal rate.   Pulmonary/Chest: Effort normal.  Abdominal: Soft. He exhibits no distension. There is no tenderness. There is no rebound and no guarding.  Musculoskeletal: Normal range of motion.  TTP to the left oblique muscle. Pain worse with flexion and twisting of the torso. There is no palpable hernias. No midline spinal tenderness. No lumbar paraspinal tenderness.   Neurological: He is alert.  Normal strength and sensation to the bilateral lower extremities.  Skin: No pallor.  Psychiatric: He has a normal mood and affect. His behavior is normal.  Nursing note and vitals reviewed.  ED Treatments / Results  DIAGNOSTIC STUDIES: Oxygen Saturation is 98% on RA, normal by my interpretation.   COORDINATION OF CARE: 2:25 PM-Discussed next steps with pt. Pt verbalized understanding and is agreeable with the plan.   Labs (all labs ordered are listed, but only abnormal results are displayed) Labs Reviewed - No data to display  EKG  EKG Interpretation None      Radiology No results found.  Procedures Procedures   Medications Ordered in ED Medications - No data to display  Initial Impression / Assessment and Plan / ED Course  I have reviewed the triage vital signs and the nursing notes.  Pertinent labs & imaging results that were available during my care of the patient were reviewed by me and considered in my medical decision making (see chart for details).    Patient with back pain.  No neurological deficits and  normal neuro exam.  Patient can walk but states is painful.  No loss of bowel or bladder control.  No concern for cauda equina.  No fever, night sweats, weight loss, h/o cancer, IVDU.  RICE protocol and pain medicine indicated and discussed with patient.    Final Clinical Impressions(s) / ED Diagnoses   Final diagnoses:  None   New Prescriptions New Prescriptions   No medications on file   I personally performed the services described in this documentation, which was scribed in my presence. The recorded information has been reviewed and is accurate.       Margarita Mail, PA-C 01/30/17 2217    Tanna Furry, MD 01/31/17 1050

## 2017-01-30 NOTE — ED Triage Notes (Signed)
Pt reports intermittent left side back pain x 1 week. Denies injury. Denies urinary symptoms. Ambulatory at triage.

## 2017-03-11 ENCOUNTER — Telehealth: Payer: Self-pay | Admitting: Family

## 2017-03-11 ENCOUNTER — Emergency Department (HOSPITAL_COMMUNITY)
Admission: EM | Admit: 2017-03-11 | Discharge: 2017-03-11 | Disposition: A | Discharge status: Home / Self Care | Payer: Medicare Other | Attending: Emergency Medicine | Admitting: Emergency Medicine

## 2017-03-11 ENCOUNTER — Emergency Department (HOSPITAL_COMMUNITY): Payer: Medicare Other

## 2017-03-11 ENCOUNTER — Encounter (HOSPITAL_COMMUNITY): Payer: Self-pay | Admitting: *Deleted

## 2017-03-11 DIAGNOSIS — F71 Moderate intellectual disabilities: Secondary | ICD-10-CM | POA: Diagnosis not present

## 2017-03-11 DIAGNOSIS — F329 Major depressive disorder, single episode, unspecified: Secondary | ICD-10-CM | POA: Diagnosis not present

## 2017-03-11 DIAGNOSIS — M25562 Pain in left knee: Secondary | ICD-10-CM

## 2017-03-11 DIAGNOSIS — W2203XA Walked into furniture, initial encounter: Secondary | ICD-10-CM | POA: Diagnosis not present

## 2017-03-11 DIAGNOSIS — M7989 Other specified soft tissue disorders: Secondary | ICD-10-CM | POA: Diagnosis not present

## 2017-03-11 DIAGNOSIS — I10 Essential (primary) hypertension: Secondary | ICD-10-CM | POA: Diagnosis not present

## 2017-03-11 DIAGNOSIS — Z79899 Other long term (current) drug therapy: Secondary | ICD-10-CM | POA: Diagnosis not present

## 2017-03-11 DIAGNOSIS — J45909 Unspecified asthma, uncomplicated: Secondary | ICD-10-CM | POA: Diagnosis not present

## 2017-03-11 NOTE — Discharge Instructions (Signed)
YOUR X- RAYS SHOWED KNEE SWELLING BUT NO BROKEN BONES. SEE YOUR PRIMARY CARE PROVIDER IN 1-2 WEEKS IF YOUR KNEE PAIN IS NOT BETTER WITH ICE, ELEVATION, AND TYLENOL.

## 2017-03-11 NOTE — ED Triage Notes (Signed)
To ED for eval of left leg and knee pain since walking into a table on Wednesday. Left knee appears swollen. Pt ambulatory.

## 2017-03-11 NOTE — Telephone Encounter (Signed)
[id number] [place], [place] [address] [phone] (Phone) [phone] (Fax)   Patient is requesting a medication called in for the gout in his foot. He states last time he was here Terri Piedra said to just let him know when he had a flare up, he could call something in. Patient has been informed Marya Amsler is out of office until Wednesday. Please advise. Thank you.

## 2017-03-11 NOTE — ED Provider Notes (Signed)
[facility] DEPT Provider Note   CSN: [id number] Arrival date & time: 03/11/17  1816     History   Chief Complaint Chief Complaint  Patient presents with  . Foot Pain    HPI  Roberto Horne is a 56 y.o. male.  56yo M w/ PMH including MR, OSA, asthma, GERD, HTN, HLD who p/w L knee pain. 5 days ago the patient struck his knee on a table when he was walking because he didn't see it. Since then he has had swelling and intermittent pain of his left knee but it did not initially seek medical attention because he thought it would get better on its own. He has not taken any medications for his symptoms. He denies any numbness.   The history is provided by the patient.  Foot Pain     Past Medical History:  Diagnosis Date  . Allergy   . Arthritis   . Asthma   . Depression   . GERD (gastroesophageal reflux disease)   . Gout   . Hyperlipemia   . Hypertension   . Mental impairment   . Pneumonia   . Sleep apnea     Patient Active Problem List   Diagnosis Date Noted  . Mental retardation 12/03/2016  . Dental caries 12/03/2016  . Obesity 10/01/2016  . Left foot pain 08/30/2015  . Acute upper respiratory infection 08/30/2015  . Nasal polyps 07/21/2015  . Contusion, arm, upper 03/01/2015  . Hand contusion 03/01/2015  . Obstructive sleep apnea 12/16/2014  . Seasonal and perennial allergic rhinitis 09/21/2014  . Medicare annual wellness visit, subsequent 09/07/2014  . CAP (community acquired pneumonia) 07/07/2014  . Effusion of right knee 07/07/2014  . Hypertension 07/07/2014  . Abdominal pain 07/07/2014  . Pneumonia 07/07/2014  . Asthma, mild persistent 12/15/2012  . Gout, unspecified 12/15/2012  . Esophageal reflux 12/15/2012  . HTN (hypertension) 11/06/2012  . Hyperlipidemia 11/06/2012    Past Surgical History:  Procedure Laterality Date  . BRAIN SURGERY    . FUNCTIONAL ENDOSCOPIC SINUS SURGERY  10/14/2015  . SINUS ENDO W/FUSION Bilateral 10/14/2015   Procedure:  ENDOSCOPIC SINUS SURGERY WITH NAVIGATION;  Surgeon: Melida Quitter, MD;  Location: [facility];  Service: ENT;  Laterality: Bilateral;       Home Medications    Prior to Admission medications   Medication Sig Start Date End Date Taking? Authorizing Provider  Albuterol Sulfate (PROAIR RESPICLICK) 935 (90 Base) MCG/ACT AEPB Inhale 2 puffs into the lungs 4 (four) times daily as needed. 12/06/16   Biagio Borg, MD  fluticasone Asencion Islam) 50 MCG/ACT nasal spray  10/17/16   [provider]  loratadine (CLARITIN) 10 MG tablet Take 10 mg by mouth daily as needed for allergies.     [provider]  metoprolol succinate (TOPROL-XL) 25 MG 24 hr tablet Take 1 tablet (25 mg total) by mouth daily. 10/01/16   Golden Circle, FNP  omeprazole (PRILOSEC OTC) 20 MG tablet Take 20 mg by mouth daily.    [provider]    Family History Family History  Problem Relation Age of Onset  . Arthritis Mother   . Hyperlipidemia Mother   . Hypertension Mother   . Diabetes Mother   . Stomach cancer Mother        possible, not 100 % sure  . Kidney disease Mother   . Arthritis Father   . Hypertension Father   . Diabetes Father   . Asthma Maternal Grandmother   . Rectal cancer Neg Hx   .  Colon cancer Neg Hx     Social History Social History  Substance Use Topics  . Smoking status: Never Smoker  . Smokeless tobacco: Never Used  . Alcohol use No     Allergies   Ibuprofen   Review of Systems Review of Systems  Musculoskeletal: Positive for arthralgias and joint swelling.  Skin: Negative for color change.  Neurological: Negative for weakness and numbness.     Physical Exam Updated Vital Signs BP (!) 150/112   Pulse (!) 120   Temp 98.6 F (37 C) (Oral)   Resp 16   SpO2 98%   Physical Exam  Constitutional: He is oriented to person, place, and time. He appears well-developed and well-nourished. No distress.  HENT:  Head: Normocephalic and atraumatic.  Eyes: Conjunctivae  are normal.  Neck: Neck supple.  Cardiovascular: Intact distal pulses.   Musculoskeletal: He exhibits edema.  Edema of L knee but no focal tenderness to palpation and no joint laxity; able to bear weight on leg with limp  Neurological: He is alert and oriented to person, place, and time. No sensory deficit.  Skin: Skin is warm and dry.  Psychiatric:  Calm, pleasant  Nursing note and vitals reviewed.    ED Treatments / Results  Labs (all labs ordered are listed, but only abnormal results are displayed) Labs Reviewed - No data to display  EKG  EKG Interpretation None       Radiology Dg Knee Complete 4 Views Left  Result Date: 03/11/2017 CLINICAL DATA:  Generalized knee pain and swelling after injury EXAM: LEFT KNEE - COMPLETE 4+ VIEW COMPARISON:  11/06/2012 FINDINGS: No acute displaced fracture or malalignment. Mild patellofemoral degenerative changes. Small suprapatellar joint effusion. Mild medial and lateral degenerative changes. IMPRESSION: 1. No acute osseous abnormality. 2. Mild degenerative changes.  Small suprapatellar effusion. Electronically Signed   By: Donavan Foil M.D.   On: 03/11/2017 20:43    Procedures Procedures (including critical care time)  Medications Ordered in ED Medications - No data to display   Initial Impression / Assessment and Plan / ED Course  I have reviewed the triage vital signs and the nursing notes.  Pertinent imaging results that were available during my care of the patient were reviewed by me and considered in my medical decision making (see chart for details).     Persistent L knee pain after striking knee last week. XR shows No bony injury but small suprapatellar effusion. Placed patient in knee immobilizer and gave crutches for comfort. Instructed to follow-up with his primary care provider in one to 2 weeks. If his symptoms continue he can be considered for MRI at that time. Discussed supportive measures. Patient voiced  understanding.  Final Clinical Impressions(s) / ED Diagnoses   Final diagnoses:  Acute pain of left knee    New Prescriptions New Prescriptions   No medications on file     Ermelinda Eckert, Wenda Overland, MD 03/11/17 2049

## 2017-03-12 MED ORDER — COLCHICINE 0.6 MG PO TABS: ORAL_TABLET | ORAL | 1 refills | Status: DC

## 2017-03-12 NOTE — Telephone Encounter (Signed)
Called pt/wife no answer [facility].../[facility]

## 2017-03-12 NOTE — Telephone Encounter (Signed)
Medication sent to pharmacy  

## 2017-03-14 ENCOUNTER — Ambulatory Visit: Payer: Medicare Other | Admitting: Internal Medicine

## 2017-03-25 ENCOUNTER — Encounter (HOSPITAL_COMMUNITY): Payer: Self-pay | Admitting: *Deleted

## 2017-03-25 ENCOUNTER — Emergency Department (HOSPITAL_COMMUNITY): Payer: Medicare Other

## 2017-03-25 ENCOUNTER — Emergency Department (HOSPITAL_COMMUNITY)
Admission: EM | Admit: 2017-03-25 | Discharge: 2017-03-25 | Disposition: A | Discharge status: Home / Self Care | Payer: Medicare Other | Attending: Emergency Medicine | Admitting: Emergency Medicine

## 2017-03-25 DIAGNOSIS — Z79899 Other long term (current) drug therapy: Secondary | ICD-10-CM | POA: Insufficient documentation

## 2017-03-25 DIAGNOSIS — J181 Lobar pneumonia, unspecified organism: Secondary | ICD-10-CM | POA: Insufficient documentation

## 2017-03-25 DIAGNOSIS — I10 Essential (primary) hypertension: Secondary | ICD-10-CM | POA: Insufficient documentation

## 2017-03-25 DIAGNOSIS — R062 Wheezing: Secondary | ICD-10-CM | POA: Diagnosis present

## 2017-03-25 DIAGNOSIS — R0602 Shortness of breath: Secondary | ICD-10-CM | POA: Diagnosis not present

## 2017-03-25 DIAGNOSIS — J189 Pneumonia, unspecified organism: Secondary | ICD-10-CM | POA: Diagnosis not present

## 2017-03-25 DIAGNOSIS — R05 Cough: Secondary | ICD-10-CM | POA: Diagnosis not present

## 2017-03-25 DIAGNOSIS — R079 Chest pain, unspecified: Secondary | ICD-10-CM | POA: Diagnosis not present

## 2017-03-25 DIAGNOSIS — E785 Hyperlipidemia, unspecified: Secondary | ICD-10-CM | POA: Diagnosis not present

## 2017-03-25 DIAGNOSIS — J45901 Unspecified asthma with (acute) exacerbation: Secondary | ICD-10-CM | POA: Diagnosis not present

## 2017-03-25 MED ORDER — IPRATROPIUM-ALBUTEROL 0.5-2.5 (3) MG/3ML IN SOLN
3.0000 mL | Freq: Once | RESPIRATORY_TRACT | Status: AC
  Administered 2017-03-25: 3 mL via RESPIRATORY_TRACT
  Filled 2017-03-25: qty 3

## 2017-03-25 MED ORDER — PREDNISONE 10 MG PO TABS: 20.0000 mg | ORAL_TABLET | Freq: Every day | ORAL | 0 refills | Status: AC

## 2017-03-25 MED ORDER — ALBUTEROL SULFATE HFA 108 (90 BASE) MCG/ACT IN AERS
1.0000 | INHALATION_SPRAY | Freq: Once | RESPIRATORY_TRACT | Status: AC
  Administered 2017-03-25: 1 via RESPIRATORY_TRACT
  Filled 2017-03-25: qty 6.7

## 2017-03-25 MED ORDER — ALBUTEROL SULFATE HFA 108 (90 BASE) MCG/ACT IN AERS: 2.0000 | INHALATION_SPRAY | RESPIRATORY_TRACT | 0 refills | Status: DC | PRN

## 2017-03-25 MED ORDER — METHYLPREDNISOLONE SODIUM SUCC 125 MG IJ SOLR
125.0000 mg | Freq: Once | INTRAMUSCULAR | Status: AC
  Administered 2017-03-25: 125 mg via INTRAVENOUS
  Filled 2017-03-25: qty 2

## 2017-03-25 MED ORDER — ALBUTEROL SULFATE (2.5 MG/3ML) 0.083% IN NEBU
2.5000 mg | INHALATION_SOLUTION | Freq: Once | RESPIRATORY_TRACT | Status: AC
  Administered 2017-03-25: 2.5 mg via RESPIRATORY_TRACT
  Filled 2017-03-25: qty 3

## 2017-03-25 MED ORDER — AZITHROMYCIN 250 MG PO TABS: 250.0000 mg | ORAL_TABLET | Freq: Every day | ORAL | 0 refills | Status: DC

## 2017-03-25 NOTE — ED Triage Notes (Signed)
Patient is alert and oriented x4.  He is being seen for shortness of breath with wheezing.  Currently he denies any pain.

## 2017-03-25 NOTE — ED Provider Notes (Signed)
[facility] DEPT Provider Note   CSN: [id number] Arrival date & time: 03/25/17  1119     History   Chief Complaint Chief Complaint  Patient presents with  . Wheezing    HPI Roberto Horne is a 56 y.o. male with past medical history of asthma who presents with wheezing that has been ongoing since this morning. Patient reports he has had some intermittent wheezing for the last few days but that this morning it became consistent. Patient reports that he uses a albuterol inhaler for his asthma but states that he ran out of it a couple days ago and not been able to refill it.  Patient also notes that he has been having a cough that has been producing white phlegm that has been ongoing for the last 5 days. He has not taken any medications for the cough. Patient denies any fevers, chills, chest pain, abdominal pain, nausea/vomiting. Patient is a poor historian.   The history is provided by the patient.    Past Medical History:  Diagnosis Date  . Allergy   . Arthritis   . Asthma   . Depression   . GERD (gastroesophageal reflux disease)   . Gout   . Hyperlipemia   . Hypertension   . Mental impairment   . Pneumonia   . Sleep apnea     Patient Active Problem List   Diagnosis Date Noted  . Mental retardation 12/03/2016  . Dental caries 12/03/2016  . Obesity 10/01/2016  . Left foot pain 08/30/2015  . Acute upper respiratory infection 08/30/2015  . Nasal polyps 07/21/2015  . Contusion, arm, upper 03/01/2015  . Hand contusion 03/01/2015  . Obstructive sleep apnea 12/16/2014  . Seasonal and perennial allergic rhinitis 09/21/2014  . Medicare annual wellness visit, subsequent 09/07/2014  . CAP (community acquired pneumonia) 07/07/2014  . Effusion of right knee 07/07/2014  . Hypertension 07/07/2014  . Abdominal pain 07/07/2014  . Pneumonia 07/07/2014  . Asthma, mild persistent 12/15/2012  . Gout, unspecified 12/15/2012  . Esophageal reflux 12/15/2012  . HTN (hypertension)  11/06/2012  . Hyperlipidemia 11/06/2012    Past Surgical History:  Procedure Laterality Date  . BRAIN SURGERY    . FUNCTIONAL ENDOSCOPIC SINUS SURGERY  10/14/2015  . SINUS ENDO W/FUSION Bilateral 10/14/2015   Procedure: ENDOSCOPIC SINUS SURGERY WITH NAVIGATION;  Surgeon: Melida Quitter, MD;  Location: [facility];  Service: ENT;  Laterality: Bilateral;       Home Medications    Prior to Admission medications   Medication Sig Start Date End Date Taking? Authorizing Provider  baclofen (LIORESAL) 10 MG tablet TAKE 1 TABLET BY MOUTH EVERY 8 HOURS AS NEEDED FOR SPASM 02/01/17  Yes [provider]  colchicine 0.6 MG tablet Take 2 tablets at the onset of a flare and 1 tablet 1 hour later. Repeat in 72 hours if needed. 03/12/17  Yes Golden Circle, FNP  fluticasone (FLONASE) 50 MCG/ACT nasal spray Place 1 spray into both nostrils daily.  10/17/16  Yes [provider]  loratadine (CLARITIN) 10 MG tablet Take 10 mg by mouth daily as needed for allergies.    Yes [provider]  metoprolol succinate (TOPROL-XL) 25 MG 24 hr tablet Take 1 tablet (25 mg total) by mouth daily. 10/01/16  Yes Golden Circle, FNP  omeprazole (PRILOSEC OTC) 20 MG tablet Take 20 mg by mouth daily.   Yes [provider]  albuterol (PROVENTIL HFA;VENTOLIN HFA) 108 (90 Base) MCG/ACT inhaler Inhale 2 puffs into the lungs every  4 (four) hours as needed for wheezing or shortness of breath. 03/25/17   Volanda Napoleon, PA-C  azithromycin (ZITHROMAX) 250 MG tablet Take 1 tablet (250 mg total) by mouth daily. Take first 2 tablets together, then 1 every day until finished. 03/25/17   Volanda Napoleon, PA-C  predniSONE (DELTASONE) 10 MG tablet Take 2 tablets (20 mg total) by mouth daily. 03/25/17 03/31/17  Volanda Napoleon, PA-C    Family History Family History  Problem Relation Age of Onset  . Arthritis Mother   . Hyperlipidemia Mother   . Hypertension Mother   . Diabetes Mother   . Stomach cancer  Mother        possible, not 100 % sure  . Kidney disease Mother   . Arthritis Father   . Hypertension Father   . Diabetes Father   . Asthma Maternal Grandmother   . Rectal cancer Neg Hx   . Colon cancer Neg Hx     Social History Social History  Substance Use Topics  . Smoking status: Never Smoker  . Smokeless tobacco: Never Used  . Alcohol use No     Allergies   Ibuprofen   Review of Systems Review of Systems  Constitutional: Negative for fever.  Respiratory: Positive for cough and wheezing. Negative for shortness of breath.   Cardiovascular: Negative for chest pain.  Gastrointestinal: Negative for abdominal pain, nausea and vomiting.  Genitourinary: Negative for dysuria.     Physical Exam Updated Vital Signs BP 130/88 (BP Location: Left Arm)   Pulse 77   Temp 98.1 F (36.7 C) (Oral)   Resp 20   Ht 6\' 3"  (1.905 m)   Wt 136.1 kg (300 lb)   SpO2 98%   BMI 37.50 kg/m   Physical Exam  Constitutional: He appears well-developed and well-nourished.  HENT:  Head: Normocephalic and atraumatic.  Eyes: Conjunctivae and EOM are normal. Right eye exhibits no discharge. Left eye exhibits no discharge. No scleral icterus.  Cardiovascular: Normal rate, regular rhythm and normal pulses.   Pulmonary/Chest: Effort normal. He has wheezes. He has rales.  No evidence of respiratory distress. Able to speak in full sentences without difficulty. Diffuse wheezing throughout lung fields. Faint inspiratory crackles on the lower lung bases.   Neurological: He is alert.  Skin: Skin is warm and dry.  Psychiatric: He has a normal mood and affect. His speech is normal and behavior is normal.  Nursing note and vitals reviewed.    ED Treatments / Results  Labs (all labs ordered are listed, but only abnormal results are displayed) Labs Reviewed - No data to display  EKG  EKG Interpretation None       Radiology Dg Chest 2 View  Result Date: 03/25/2017 CLINICAL DATA:  Shortness  of breath and wheezing without chest pain. The patient reports some cough. History of asthma. EXAM: CHEST  2 VIEW COMPARISON:  PA and lateral chest x-ray of July 11, 2014 FINDINGS: The lungs are adequately inflated. The interstitial markings are mildly increased. There is infiltrate in the left lower lobe posteriorly. The heart and pulmonary vascularity are normal. The mediastinum is normal in width. IMPRESSION: Left lower lobe atelectasis or pneumonia. Chronic changes of reactive airway disease. Followup PA and lateral chest X-ray is recommended in 3-4 weeks following trial of antibiotic therapy to ensure resolution and exclude underlying malignancy. Electronically Signed   By: David  Martinique M.D.   On: 03/25/2017 13:44    Procedures Procedures (including critical care time)  Medications Ordered in ED Medications  albuterol (PROVENTIL) (2.5 MG/3ML) 0.083% nebulizer solution 2.5 mg (2.5 mg Nebulization Given 03/25/17 1402)  albuterol (PROVENTIL) (2.5 MG/3ML) 0.083% nebulizer solution 2.5 mg (2.5 mg Nebulization Given 03/25/17 1420)  methylPREDNISolone sodium succinate (SOLU-MEDROL) 125 mg/2 mL injection 125 mg (125 mg Intravenous Given 03/25/17 1531)  ipratropium-albuterol (DUONEB) 0.5-2.5 (3) MG/3ML nebulizer solution 3 mL (3 mLs Nebulization Given 03/25/17 1532)  albuterol (PROVENTIL HFA;VENTOLIN HFA) 108 (90 Base) MCG/ACT inhaler 1 puff (1 puff Inhalation Given 03/25/17 1630)     Initial Impression / Assessment and Plan / ED Course  I have reviewed the triage vital signs and the nursing notes.  Pertinent labs & imaging results that were available during my care of the patient were reviewed by me and considered in my medical decision making (see chart for details).     25 -year-old male with past history of asthma who presents with wheezing that began yesterday. Patient recently ran out of his albuterol inhaler and has not been able to get it refilled. Also reports he has had a cough producing  white phlegm. No fevers, chills, chest pain. Patient initially not be found and went to his room. He was later located in place in the room. Patient is afebrile, non-toxic appearing, sitting comfortably on examination table. Vital show patient with a 44% on room air. Physical exam shows good air movement. He is has diffuse wheezing throughout all lung fields and some faint crackles on lower lung fiels. Consider asthma exacerbation vs URI vs acute infectious etiology.  Initial nebulizer treatment began. Will also plan to give steroids and check chest x-ray for further evaluation.  Re-evaluation: Wheezing has improved after nebulizer treatment. Still mild wheezing and faint crackles. Will do additional nebulizer treatment for symptomatic relief.   CXR reviewed. Concern for left lower lobe pneumonia vs atelectasis. Reviewed prior CXRs from 2016 showed some similar consolidation in the left lower lobe that was concerning for pneumonia at this time. Discussed results with patient. Re-evaluation after nebulizer treatment. Wheezing has improved. No evidence of respratory distress. Vitals stable. Discussed regarding outpatient treatment options. Will plan to treat. Will also refill albuterol inhaler and provide patient on steroids. Instructed patient to follow-up with PCP in 2 days. Strict return precautions discussed. Patient expresses understanding and agreement to plan.     Final Clinical Impressions(s) / ED Diagnoses   Final diagnoses:  Exacerbation of asthma, unspecified asthma severity, unspecified whether persistent  Community acquired pneumonia of left lower lobe of lung ([place])    New Prescriptions Discharge Medication List as of 03/25/2017  4:23 PM    START taking these medications   Details  albuterol (PROVENTIL HFA;VENTOLIN HFA) 108 (90 Base) MCG/ACT inhaler Inhale 2 puffs into the lungs every 4 (four) hours as needed for wheezing or shortness of breath., Starting Mon 03/25/2017, Print      azithromycin (ZITHROMAX) 250 MG tablet Take 1 tablet (250 mg total) by mouth daily. Take first 2 tablets together, then 1 every day until finished., Starting Mon 03/25/2017, Print    predniSONE (DELTASONE) 10 MG tablet Take 2 tablets (20 mg total) by mouth daily., Starting Mon 03/25/2017, Until Sun 03/31/2017, Print         Volanda Napoleon, PA-C 03/26/17 1310    Drenda Freeze, MD 03/27/17 [phone]

## 2017-03-25 NOTE — Discharge Instructions (Signed)
Take antibiotics as directed. Please take all of your antibiotics until finished.  Use inhaler as directed.   Take Prednisone as directed.   Follow-up with your primary care doctor in 2 days. Call their office and let them know you were seen in the ED.   Return the emergency Department for any worsening wheezing, difficulty breathing, chest pain, fever, cough, vomiting or any other worsening or concerning symptoms.

## 2017-03-25 NOTE — ED Provider Notes (Signed)
Patient was assigned to a room. When I went to the room he was not there. I attempted to locate the patient but he could not be found. I did not participate in the care of this patient.   Providence Lanius, PA-C    Volanda Napoleon, PA-C 03/25/17 1329    Drenda Freeze, MD 03/25/17 [phone]

## 2017-03-25 NOTE — ED Notes (Signed)
Bed: WTR8 Expected date:  Expected time:  Means of arrival:  Comments: 

## 2017-04-18 ENCOUNTER — Emergency Department (HOSPITAL_COMMUNITY): Payer: Medicare Other

## 2017-04-18 ENCOUNTER — Emergency Department (HOSPITAL_COMMUNITY)
Admission: EM | Admit: 2017-04-18 | Discharge: 2017-04-18 | Disposition: A | Discharge status: Home / Self Care | Payer: Medicare Other | Attending: Emergency Medicine | Admitting: Emergency Medicine

## 2017-04-18 ENCOUNTER — Encounter (HOSPITAL_COMMUNITY): Payer: Self-pay

## 2017-04-18 DIAGNOSIS — I10 Essential (primary) hypertension: Secondary | ICD-10-CM | POA: Insufficient documentation

## 2017-04-18 DIAGNOSIS — R05 Cough: Secondary | ICD-10-CM | POA: Diagnosis not present

## 2017-04-18 DIAGNOSIS — Z79899 Other long term (current) drug therapy: Secondary | ICD-10-CM | POA: Diagnosis not present

## 2017-04-18 DIAGNOSIS — E785 Hyperlipidemia, unspecified: Secondary | ICD-10-CM | POA: Diagnosis not present

## 2017-04-18 DIAGNOSIS — R062 Wheezing: Secondary | ICD-10-CM | POA: Diagnosis present

## 2017-04-18 DIAGNOSIS — R0602 Shortness of breath: Secondary | ICD-10-CM | POA: Diagnosis not present

## 2017-04-18 DIAGNOSIS — J45901 Unspecified asthma with (acute) exacerbation: Secondary | ICD-10-CM | POA: Insufficient documentation

## 2017-04-18 MED ORDER — ALBUTEROL SULFATE (2.5 MG/3ML) 0.083% IN NEBU
5.0000 mg | INHALATION_SOLUTION | Freq: Once | RESPIRATORY_TRACT | Status: AC
  Administered 2017-04-18: 5 mg via RESPIRATORY_TRACT

## 2017-04-18 MED ORDER — METHYLPREDNISOLONE SODIUM SUCC 125 MG IJ SOLR
80.0000 mg | Freq: Once | INTRAMUSCULAR | Status: AC
  Administered 2017-04-18: 80 mg via INTRAMUSCULAR
  Filled 2017-04-18: qty 2

## 2017-04-18 MED ORDER — IPRATROPIUM-ALBUTEROL 0.5-2.5 (3) MG/3ML IN SOLN
3.0000 mL | Freq: Once | RESPIRATORY_TRACT | Status: AC
  Administered 2017-04-18: 3 mL via RESPIRATORY_TRACT
  Filled 2017-04-18: qty 3

## 2017-04-18 MED ORDER — ALBUTEROL SULFATE (2.5 MG/3ML) 0.083% IN NEBU
INHALATION_SOLUTION | RESPIRATORY_TRACT | Status: AC
  Filled 2017-04-18: qty 6

## 2017-04-18 MED ORDER — PREDNISONE 20 MG PO TABS: ORAL_TABLET | ORAL | 0 refills | Status: DC

## 2017-04-18 MED ORDER — ALBUTEROL SULFATE HFA 108 (90 BASE) MCG/ACT IN AERS: 2.0000 | INHALATION_SPRAY | RESPIRATORY_TRACT | 0 refills | Status: DC | PRN

## 2017-04-18 NOTE — Discharge Instructions (Signed)
We believe that your symptoms are caused today by an exacerbation of your asthma.   Please take the prescribed medications and any medications that you have at home.    Follow up with your doctor as recommended. If you do not have a primary care doctor, you can use one listed in the paperwork.   If you develop any new or worsening symptoms, including but not limited to fever, persistent vomiting, worsening shortness of breath, or other symptoms that concern you, please return to the Emergency Department immediately.

## 2017-04-18 NOTE — ED Triage Notes (Signed)
Patient complains of asthma exacerbation with cough that is worse at night the past week. Speaking complete sentences,NAD

## 2017-04-18 NOTE — ED Provider Notes (Signed)
[facility] EMERGENCY DEPARTMENT Provider Note   CSN: [id number] Arrival date & time: 04/18/17  1419     History   Chief Complaint Chief Complaint  Patient presents with  . Wheezing    HPI Roberto Horne is a 56 y.o. male past medical history of asthma who presents with wheezing since last night. Patient reports that he has been using his inhaler with minimal improvement. He has not used any breathing treatments. Patient reports he felt like the wheezing was getting worse today prompting ED visit. Patient also reports a productive cough with mucus phlegm. He reports that this is been ongoing for the past couple days. He was seen in the ED on 03/25/17 for similar symptoms and discharged home with a short course of prednisone, albuterol inhaler and abx. He was instructed to follow-up with his PCP but states that he has not been able to see them yet. Patient denies any difficulty breathing, chest pain, fevers.   The history is provided by the patient.    Past Medical History:  Diagnosis Date  . Allergy   . Arthritis   . Asthma   . Depression   . GERD (gastroesophageal reflux disease)   . Gout   . Hyperlipemia   . Hypertension   . Mental impairment   . Pneumonia   . Sleep apnea     Patient Active Problem List   Diagnosis Date Noted  . Mental retardation 12/03/2016  . Dental caries 12/03/2016  . Obesity 10/01/2016  . Left foot pain 08/30/2015  . Acute upper respiratory infection 08/30/2015  . Nasal polyps 07/21/2015  . Contusion, arm, upper 03/01/2015  . Hand contusion 03/01/2015  . Obstructive sleep apnea 12/16/2014  . Seasonal and perennial allergic rhinitis 09/21/2014  . Medicare annual wellness visit, subsequent 09/07/2014  . CAP (community acquired pneumonia) 07/07/2014  . Effusion of right knee 07/07/2014  . Hypertension 07/07/2014  . Abdominal pain 07/07/2014  . Pneumonia 07/07/2014  . Asthma, mild persistent 12/15/2012  . Gout,  unspecified 12/15/2012  . Esophageal reflux 12/15/2012  . HTN (hypertension) 11/06/2012  . Hyperlipidemia 11/06/2012    Past Surgical History:  Procedure Laterality Date  . BRAIN SURGERY    . FUNCTIONAL ENDOSCOPIC SINUS SURGERY  10/14/2015  . SINUS ENDO W/FUSION Bilateral 10/14/2015   Procedure: ENDOSCOPIC SINUS SURGERY WITH NAVIGATION;  Surgeon: Melida Quitter, MD;  Location: [facility];  Service: ENT;  Laterality: Bilateral;       Home Medications    Prior to Admission medications   Medication Sig Start Date End Date Taking? Authorizing Provider  albuterol (PROVENTIL HFA;VENTOLIN HFA) 108 (90 Base) MCG/ACT inhaler Inhale 2 puffs into the lungs every 4 (four) hours as needed for wheezing or shortness of breath. 04/18/17   Volanda Napoleon, PA-C  azithromycin (ZITHROMAX) 250 MG tablet Take 1 tablet (250 mg total) by mouth daily. Take first 2 tablets together, then 1 every day until finished. 03/25/17   Volanda Napoleon, PA-C  baclofen (LIORESAL) 10 MG tablet TAKE 1 TABLET BY MOUTH EVERY 8 HOURS AS NEEDED FOR SPASM 02/01/17   [provider]  colchicine 0.6 MG tablet Take 2 tablets at the onset of a flare and 1 tablet 1 hour later. Repeat in 72 hours if needed. 03/12/17   Golden Circle, FNP  fluticasone (FLONASE) 50 MCG/ACT nasal spray Place 1 spray into both nostrils daily.  10/17/16   [provider]  loratadine (CLARITIN) 10 MG tablet Take 10 mg by mouth  daily as needed for allergies.     [provider]  metoprolol succinate (TOPROL-XL) 25 MG 24 hr tablet Take 1 tablet (25 mg total) by mouth daily. 10/01/16   Golden Circle, FNP  omeprazole (PRILOSEC OTC) 20 MG tablet Take 20 mg by mouth daily.    [provider]  predniSONE (DELTASONE) 20 MG tablet 3 tabs po daily for 3 days, then 2 tabs for 3 days, then 1.5 tabs for 3 days, then 1 tab for 3 days, then 0.5 tabs for 3 days, 04/18/17   Volanda Napoleon, PA-C    Family History Family History  Problem  Relation Age of Onset  . Arthritis Mother   . Hyperlipidemia Mother   . Hypertension Mother   . Diabetes Mother   . Stomach cancer Mother        possible, not 100 % sure  . Kidney disease Mother   . Arthritis Father   . Hypertension Father   . Diabetes Father   . Asthma Maternal Grandmother   . Rectal cancer Neg Hx   . Colon cancer Neg Hx     Social History Social History  Substance Use Topics  . Smoking status: Never Smoker  . Smokeless tobacco: Never Used  . Alcohol use No     Allergies   Ibuprofen   Review of Systems Review of Systems  Constitutional: Negative for fever.  Respiratory: Positive for cough and wheezing. Negative for shortness of breath.   Cardiovascular: Negative for chest pain.  Gastrointestinal: Negative for abdominal pain, nausea and vomiting.     Physical Exam Updated Vital Signs BP (!) 146/108 (BP Location: Left Wrist)   Pulse 77   Temp 98 F (36.7 C) (Oral)   Resp 18   SpO2 97%   Physical Exam  Constitutional: He appears well-developed and well-nourished.  HENT:  Head: Normocephalic and atraumatic.  Eyes: Conjunctivae and EOM are normal. Right eye exhibits no discharge. Left eye exhibits no discharge. No scleral icterus.  Cardiovascular: Normal rate and regular rhythm.   Pulses:      Radial pulses are 2+ on the right side, and 2+ on the left side.  Pulmonary/Chest: Effort normal. He has wheezes.  No evidence of respiratory distress. Able to speak in full sentences without difficulty.  Neurological: He is alert.  Skin: Skin is warm and dry.  Psychiatric: He has a normal mood and affect. His speech is normal and behavior is normal.  Nursing note and vitals reviewed.    ED Treatments / Results  Labs (all labs ordered are listed, but only abnormal results are displayed) Labs Reviewed - No data to display  EKG  EKG Interpretation  Date/Time:  Thursday April 18 2017 14:28:14 EDT Ventricular Rate:  89 PR Interval:  172 QRS  Duration: 70 QT Interval:  354 QTC Calculation: 430 R Axis:   85 Text Interpretation:  Sinus rhythm with marked sinus arrhythmia Lateral infarct , age undetermined Abnormal ECG No significant change since last tracing Confirmed by Dorie Rank [phone]) on 04/19/2017 10:29:48 AM       Radiology Dg Chest 2 View  Result Date: 04/18/2017 CLINICAL DATA:  Wheezing and cough.  Shortness of breath EXAM: CHEST  2 VIEW COMPARISON:  03/25/2017 FINDINGS: Stable interstitial coarsening that is likely bronchitic. Normal heart size and mediastinal contours. No effusion or pneumothorax. IMPRESSION: Chronic bronchitic markings without acute superimposed finding. Electronically Signed   By: Monte Fantasia M.D.   On: 04/18/2017 15:09  Procedures Procedures (including critical care time)  Medications Ordered in ED Medications  albuterol (PROVENTIL) (2.5 MG/3ML) 0.083% nebulizer solution 5 mg (5 mg Nebulization Given 04/18/17 1436)  ipratropium-albuterol (DUONEB) 0.5-2.5 (3) MG/3ML nebulizer solution 3 mL (3 mLs Nebulization Given 04/18/17 1916)  methylPREDNISolone sodium succinate (SOLU-MEDROL) 125 mg/2 mL injection 80 mg (80 mg Intramuscular Given 04/18/17 1916)     Initial Impression / Assessment and Plan / ED Course  I have reviewed the triage vital signs and the nursing notes.  Pertinent labs & imaging results that were available during my care of the patient were reviewed by me and considered in my medical decision making (see chart for details).     56 year old male with past medical history of asthma who presents today with wheezing that has been ongoing since last night. Also reports productive cough with phlegm. No chest pain, difficulty breathing, fevers. Patient is afebrile, non-toxic appearing, sitting comfortably on examination table. Vital signs reviewed and stable. Patient's O2 sats are greater than 95% on room air. No evidence of respiratory distress. He is slightly hypertensive. We  will reevaluate and reassess. Patient does have some diffuse wheezing throughout all lung fields. Chest x-ray and initial breathing treatment ordered at triage.  Records reviewed. Patient was recently seen in the emergency department on 03/25/17 for same symptoms. He was given a short course of prednisone and a Z-Pak which he states he has been compliant with.  Chest x-ray reviewed. Shows chronic bronchitic markings. No evidence of pneumonia. Discussed results with patient. Reevaluation after first nebulizer breathing treatment. He still has some diffuse wheezing. Will order additional DuoNeb treatment.  Reevaluation of her second breathing treatment. Patient reports improvement in symptoms. Feels like wheezing has improved. Reevaluation of lungs show wheezing has improved. O2 sats are 97% on room air. Patient stable for discharge at this time. Patient is still slightly hypertensive. I discussed with patient. He reports that he sometimes he takes his hypertension medication and sometimes does not. Encouraged compliance with hypertension medication. Instructed him to follow up with primary care doctor in the next 2-4 days for further evaluation and recheck of his blood pressure. Strict return precautions discussed. Patient expresses understanding and agreement to plan.    Final Clinical Impressions(s) / ED Diagnoses   Final diagnoses:  Exacerbation of asthma, unspecified asthma severity, unspecified whether persistent    New Prescriptions Discharge Medication List as of 04/18/2017  7:54 PM    START taking these medications   Details  predniSONE (DELTASONE) 20 MG tablet 3 tabs po daily for 3 days, then 2 tabs for 3 days, then 1.5 tabs for 3 days, then 1 tab for 3 days, then 0.5 tabs for 3 days,, Print         Volanda Napoleon, PA-C 04/19/17 [place]    Carmin Muskrat, MD 04/22/17 [phone]

## 2017-04-29 ENCOUNTER — Emergency Department (HOSPITAL_COMMUNITY): Payer: Medicare Other

## 2017-04-29 ENCOUNTER — Emergency Department (HOSPITAL_COMMUNITY)
Admission: EM | Admit: 2017-04-29 | Discharge: 2017-04-30 | Disposition: A | Discharge status: Home / Self Care | Payer: Medicare Other | Attending: Emergency Medicine | Admitting: Emergency Medicine

## 2017-04-29 ENCOUNTER — Encounter (HOSPITAL_COMMUNITY): Payer: Self-pay

## 2017-04-29 ENCOUNTER — Other Ambulatory Visit: Payer: Self-pay

## 2017-04-29 DIAGNOSIS — R05 Cough: Secondary | ICD-10-CM | POA: Diagnosis not present

## 2017-04-29 DIAGNOSIS — J029 Acute pharyngitis, unspecified: Secondary | ICD-10-CM | POA: Insufficient documentation

## 2017-04-29 DIAGNOSIS — F70 Mild intellectual disabilities: Secondary | ICD-10-CM | POA: Diagnosis not present

## 2017-04-29 DIAGNOSIS — R0989 Other specified symptoms and signs involving the circulatory and respiratory systems: Secondary | ICD-10-CM | POA: Insufficient documentation

## 2017-04-29 DIAGNOSIS — R000 Tachycardia, unspecified: Secondary | ICD-10-CM | POA: Insufficient documentation

## 2017-04-29 DIAGNOSIS — J9811 Atelectasis: Secondary | ICD-10-CM | POA: Diagnosis not present

## 2017-04-29 DIAGNOSIS — I10 Essential (primary) hypertension: Secondary | ICD-10-CM | POA: Diagnosis not present

## 2017-04-29 DIAGNOSIS — J45901 Unspecified asthma with (acute) exacerbation: Secondary | ICD-10-CM | POA: Diagnosis not present

## 2017-04-29 DIAGNOSIS — Z79899 Other long term (current) drug therapy: Secondary | ICD-10-CM | POA: Diagnosis not present

## 2017-04-29 DIAGNOSIS — R0981 Nasal congestion: Secondary | ICD-10-CM | POA: Insufficient documentation

## 2017-04-29 DIAGNOSIS — R0602 Shortness of breath: Secondary | ICD-10-CM | POA: Diagnosis present

## 2017-04-29 LAB — BASIC METABOLIC PANEL
Anion gap: 11 (ref 5–15)
BUN: 20 mg/dL (ref 6–20)
CALCIUM: 9 mg/dL (ref 8.9–10.3)
CHLORIDE: 104 mmol/L (ref 101–111)
CO2: 23 mmol/L (ref 22–32)
CREATININE: 1.37 mg/dL — AB (ref 0.61–1.24)
GFR calc Af Amer: >60 mL/min (ref >60)
GFR calc non Af Amer: 56 mL/min — ABNORMAL LOW (ref >60)
Glucose, Bld: 91 mg/dL (ref 65–99)
Potassium: 3.7 mmol/L (ref 3.5–5.1)
Sodium: 138 mmol/L (ref 135–145)

## 2017-04-29 LAB — CBC
HEMATOCRIT: 42.7 % (ref 39.0–52.0)
HEMOGLOBIN: 13.5 g/dL (ref 13.0–17.0)
MCH: 24.3 pg — AB (ref 26.0–34.0)
MCHC: 31.6 g/dL (ref 30.0–36.0)
MCV: 76.9 fL — ABNORMAL LOW (ref 78.0–100.0)
Platelets: 147 10*3/uL — ABNORMAL LOW (ref 150–400)
RBC: 5.55 MIL/uL (ref 4.22–5.81)
RDW: 16 % — AB (ref 11.5–15.5)
WBC: 9.4 10*3/uL (ref 4.0–10.5)

## 2017-04-29 MED ORDER — PREDNISONE 20 MG PO TABS
60.0000 mg | ORAL_TABLET | Freq: Once | ORAL | Status: AC
  Administered 2017-04-30: 60 mg via ORAL
  Filled 2017-04-29: qty 3

## 2017-04-29 MED ORDER — ALBUTEROL (5 MG/ML) CONTINUOUS INHALATION SOLN
INHALATION_SOLUTION | RESPIRATORY_TRACT | Status: AC
  Administered 2017-04-29: 10 mg
  Filled 2017-04-29: qty 0.5

## 2017-04-29 MED ORDER — MAGNESIUM SULFATE 2 GM/50ML IV SOLN
2.0000 g | Freq: Once | INTRAVENOUS | Status: AC
  Administered 2017-04-29: 2 g via INTRAVENOUS
  Filled 2017-04-29: qty 50

## 2017-04-29 MED ORDER — ALBUTEROL SULFATE (2.5 MG/3ML) 0.083% IN NEBU
5.0000 mg | INHALATION_SOLUTION | Freq: Once | RESPIRATORY_TRACT | Status: AC
  Administered 2017-04-29: 5 mg via RESPIRATORY_TRACT

## 2017-04-29 MED ORDER — ALBUTEROL (5 MG/ML) CONTINUOUS INHALATION SOLN
10.0000 mg/h | INHALATION_SOLUTION | Freq: Once | RESPIRATORY_TRACT | Status: DC
  Filled 2017-04-29: qty 20

## 2017-04-29 MED ORDER — ALBUTEROL SULFATE (2.5 MG/3ML) 0.083% IN NEBU
INHALATION_SOLUTION | RESPIRATORY_TRACT | Status: AC
  Filled 2017-04-29: qty 6

## 2017-04-29 MED ORDER — SODIUM CHLORIDE 0.9 % IV BOLUS (SEPSIS)
1000.0000 mL | Freq: Once | INTRAVENOUS | Status: AC
  Administered 2017-04-29: 1000 mL via INTRAVENOUS

## 2017-04-29 NOTE — ED Triage Notes (Signed)
Pt reports asthma fare up since Friday. Reports productive cough with white sputum. NAD VSS.

## 2017-04-29 NOTE — ED Provider Notes (Signed)
[facility] Provider Note   CSN: [id number] Arrival date & time: 04/29/17  1543     History   Chief Complaint Chief Complaint  Patient presents with  . Asthma    HPI Roberto Horne is a 56 y.o. male who presents with shortness of breath and wheezing.  Past medical history significant for asthma, hypertension, hyperlipidemia, GERD, mild mental impairment.  Patient states that he has felt short of breath for the past 2 days.  He has been wheezing and using his albuterol inhaler more frequently.  He states he has had URI symptoms has been sneezing, coughing, having congestion and runny nose.  He has been around his brothers who have also had URIs.  He denies any fever, chills, chest pain, abdominal pain, nausea or vomiting. He is not a smoker.  HPI  Past Medical History:  Diagnosis Date  . Allergy   . Arthritis   . Asthma   . Depression   . GERD (gastroesophageal reflux disease)   . Gout   . Hyperlipemia   . Hypertension   . Mental impairment   . Pneumonia   . Sleep apnea     Patient Active Problem List   Diagnosis Date Noted  . Mental retardation 12/03/2016  . Dental caries 12/03/2016  . Obesity 10/01/2016  . Left foot pain 08/30/2015  . Acute upper respiratory infection 08/30/2015  . Nasal polyps 07/21/2015  . Contusion, arm, upper 03/01/2015  . Hand contusion 03/01/2015  . Obstructive sleep apnea 12/16/2014  . Seasonal and perennial allergic rhinitis 09/21/2014  . Medicare annual wellness visit, subsequent 09/07/2014  . CAP (community acquired pneumonia) 07/07/2014  . Effusion of right knee 07/07/2014  . Hypertension 07/07/2014  . Abdominal pain 07/07/2014  . Pneumonia 07/07/2014  . Asthma, mild persistent 12/15/2012  . Gout, unspecified 12/15/2012  . Esophageal reflux 12/15/2012  . HTN (hypertension) 11/06/2012  . Hyperlipidemia 11/06/2012    Past Surgical History:  Procedure Laterality Date  . BRAIN SURGERY    .  FUNCTIONAL ENDOSCOPIC SINUS SURGERY  10/14/2015       Home Medications    Prior to Admission medications   Medication Sig Start Date End Date Taking? Authorizing Provider  albuterol (PROVENTIL HFA;VENTOLIN HFA) 108 (90 Base) MCG/ACT inhaler Inhale 2 puffs into the lungs every 4 (four) hours as needed for wheezing or shortness of breath. 04/18/17   Volanda Napoleon, PA-C  azithromycin (ZITHROMAX) 250 MG tablet Take 1 tablet (250 mg total) by mouth daily. Take first 2 tablets together, then 1 every day until finished. 03/25/17   Volanda Napoleon, PA-C  baclofen (LIORESAL) 10 MG tablet TAKE 1 TABLET BY MOUTH EVERY 8 HOURS AS NEEDED FOR SPASM 02/01/17   [provider]  colchicine 0.6 MG tablet Take 2 tablets at the onset of a flare and 1 tablet 1 hour later. Repeat in 72 hours if needed. 03/12/17   Golden Circle, FNP  fluticasone (FLONASE) 50 MCG/ACT nasal spray Place 1 spray into both nostrils daily.  10/17/16   [provider]  loratadine (CLARITIN) 10 MG tablet Take 10 mg by mouth daily as needed for allergies.     [provider]  metoprolol succinate (TOPROL-XL) 25 MG 24 hr tablet Take 1 tablet (25 mg total) by mouth daily. 10/01/16   Golden Circle, FNP  omeprazole (PRILOSEC OTC) 20 MG tablet Take 20 mg by mouth daily.    [provider]  predniSONE (DELTASONE) 20 MG tablet  3 tabs po daily for 3 days, then 2 tabs for 3 days, then 1.5 tabs for 3 days, then 1 tab for 3 days, then 0.5 tabs for 3 days, 04/18/17   Volanda Napoleon, PA-C    Family History Family History  Problem Relation Age of Onset  . Arthritis Mother   . Hyperlipidemia Mother   . Hypertension Mother   . Diabetes Mother   . Stomach cancer Mother        possible, not 100 % sure  . Kidney disease Mother   . Arthritis Father   . Hypertension Father   . Diabetes Father   . Asthma Maternal Grandmother   . Rectal cancer Neg Hx   . Colon cancer Neg Hx     Social History Social  History   Tobacco Use  . Smoking status: Never Smoker  . Smokeless tobacco: Never Used  Substance Use Topics  . Alcohol use: No  . Drug use: No     Allergies   Ibuprofen   Review of Systems Review of Systems  Constitutional: Negative for chills and fever.  HENT: Positive for congestion, rhinorrhea, sneezing and sore throat. Negative for ear pain.   Respiratory: Positive for cough, shortness of breath and wheezing.   Cardiovascular: Negative for chest pain.  Gastrointestinal: Negative for abdominal pain, nausea and vomiting.  All other systems reviewed and are negative.    Physical Exam Updated Vital Signs BP 124/79   Pulse (!) 101   Temp 98.6 F (37 C) (Oral)   Resp (!) 24   SpO2 (!) 89%   Physical Exam  Constitutional: He is oriented to person, place, and time. He appears well-developed and well-nourished. No distress.  Obese, calm, cooperative.  Mentating appropriately  HENT:  Head: Normocephalic and atraumatic.  Right Ear: Hearing normal.  Left Ear: Hearing normal.  Nose: Nose normal.  Mouth/Throat: Uvula is midline, oropharynx is clear and moist and mucous membranes are normal.  Bilateral cerumen impaction  Eyes: Conjunctivae are normal. Pupils are equal, round, and reactive to light. Right eye exhibits no discharge. Left eye exhibits no discharge. No scleral icterus.  Neck: Normal range of motion.  Cardiovascular: Regular rhythm. Tachycardia present. Exam reveals no gallop and no friction rub.  No murmur heard. Pulmonary/Chest: Effort normal. No stridor. No respiratory distress. He has wheezes (Diffuse inspiratory and expiratory wheezes). He has no rales. He exhibits no tenderness.  Abdominal: Soft. Bowel sounds are normal. He exhibits no distension. There is no tenderness.  Neurological: He is alert and oriented to person, place, and time.  Skin: Skin is warm and dry.  Psychiatric: He has a normal mood and affect. His behavior is normal.  Nursing note and  vitals reviewed.    ED Treatments / Results  Labs (all labs ordered are listed, but only abnormal results are displayed) Labs Reviewed  CBC - Abnormal; Notable for the following components:      Result Value   MCV 76.9 (*)    MCH 24.3 (*)    RDW 16.0 (*)    Platelets 147 (*)    All other components within normal limits  BASIC METABOLIC PANEL - Abnormal; Notable for the following components:   Creatinine, Ser 1.37 (*)    GFR calc non Af Amer 56 (*)    All other components within normal limits    EKG  EKG Interpretation None       Radiology Dg Chest 2 View  Result Date: 04/29/2017 CLINICAL DATA:  Dyspnea, asthma flare up since Friday. EXAM: CHEST  2 VIEW COMPARISON:  04/18/2017 FINDINGS: Heart is top normal with minimal aortic atherosclerosis. Chronic mild interstitial lung markings consistent chronic bronchitic change. Lungs are hyperinflated with atelectasis at the lung bases. No significant pleural effusion. No pneumothorax or overt pulmonary edema. No acute nor suspicious osseous abnormality. IMPRESSION: 1. Mild pulmonary hyperinflation with minimal atelectasis at the lung bases. 2. Chronic stable bronchitic change of the lungs. Electronically Signed   By: Ashley Royalty M.D.   On: 04/29/2017 18:16    Procedures Procedures (including critical care time)  Medications Ordered in ED Medications  albuterol (PROVENTIL) (2.5 MG/3ML) 0.083% nebulizer solution (not administered)  albuterol (PROVENTIL,VENTOLIN) solution continuous neb ( Nebulization Canceled Entry 04/29/17 2258)  albuterol (PROVENTIL) (2.5 MG/3ML) 0.083% nebulizer solution 5 mg (5 mg Nebulization Given 04/29/17 1717)  predniSONE (DELTASONE) tablet 60 mg (60 mg Oral Given 04/30/17 0028)  magnesium sulfate IVPB 2 g 50 mL (0 g Intravenous Stopped 04/30/17 0024)  sodium chloride 0.9 % bolus 1,000 mL (0 mLs Intravenous Stopped 04/30/17 0230)  albuterol (PROVENTIL, VENTOLIN) (5 MG/ML) 0.5% continuous inhalation solution (10  mg  Given 04/29/17 2300)     Initial Impression / Assessment and Plan / ED Course  I have reviewed the triage vital signs and the nursing notes.  Pertinent labs & imaging results that were available during my care of the patient were reviewed by me and considered in my medical decision making (see chart for details).  56 year old with asthma exacerbation likely due to URI vs uncontrolled asthma. He is hypoxic on RA initially at rest to 85%. He is tachycardic and tachypneic as well. He is afebrile and normotensive. On exam he has diffuse inspiratory and expiratory wheezes. Labs are remarkable for mild elevation of SCr (1.37 today). CXR is negative for acute process. He will be started on continuous neb, steroids, Mg and will reassess. Shared visit with Dr. Winfred Leeds.  On recheck, lungs are CTA. He denies SOB. Ambulatory sats did drop to 88% but were back to normal at rest. Will d/c with course of steroids and PCP follow up. Return precautions given.  Final Clinical Impressions(s) / ED Diagnoses   Final diagnoses:  Moderate asthma with exacerbation, unspecified whether persistent    ED Discharge Orders    None       Recardo Evangelist, PA-C 04/30/17 0346    Orlie Dakin, MD 04/30/17 1244

## 2017-04-29 NOTE — ED Provider Notes (Signed)
Complains of wheezing, feels like asthma accompanied by cough productive of white sputum onset 2 days ago.  He denies any fever.  On exam he speaks in paragraphs.  Lungs with prolonged expiratory phase with expiratory wheezes.  Chest x-ray viewed by me   Orlie Dakin, MD 04/29/17 [id number]

## 2017-04-30 MED ORDER — PREDNISONE 20 MG PO TABS: 40.0000 mg | ORAL_TABLET | Freq: Every day | ORAL | 0 refills | Status: DC

## 2017-04-30 NOTE — Discharge Instructions (Signed)
Take steroid for the next 5 days Make an appointment with your doctor Return for worsening symptoms

## 2017-04-30 NOTE — ED Notes (Signed)
Pt verbalizes understanding of d/c instructions. Pt received prescriptions. Pt ambulatory at d/c with all belongings.  

## 2017-04-30 NOTE — ED Notes (Signed)
Pt SpO2 saturation on room air is currently at 93-94%.

## 2017-04-30 NOTE — ED Notes (Signed)
Ambulated pt in the hallway with pulse ox. Pt's O2 started at 94% in the room. Pt's O2 dropped to around 88% while walking and once back in the room. RN made aware

## 2017-05-08 ENCOUNTER — Encounter: Payer: Self-pay | Admitting: Nurse Practitioner

## 2017-05-08 ENCOUNTER — Ambulatory Visit (INDEPENDENT_AMBULATORY_CARE_PROVIDER_SITE_OTHER): Payer: Medicare Other | Admitting: Nurse Practitioner

## 2017-05-08 VITALS — BP 130/90 | HR 92 | Temp 98.1°F | Ht 75.0 in | Wt 295.0 lb

## 2017-05-08 DIAGNOSIS — J4541 Moderate persistent asthma with (acute) exacerbation: Secondary | ICD-10-CM

## 2017-05-08 MED ORDER — BUDESONIDE-FORMOTEROL FUMARATE 80-4.5 MCG/ACT IN AERO: 1.0000 | INHALATION_SPRAY | Freq: Two times a day (BID) | RESPIRATORY_TRACT | 0 refills | Status: DC

## 2017-05-08 MED ORDER — DM-GUAIFENESIN ER 30-600 MG PO TB12: 1.0000 | ORAL_TABLET | Freq: Two times a day (BID) | ORAL | 0 refills | Status: DC | PRN

## 2017-05-08 MED ORDER — PROMETHAZINE-DM 6.25-15 MG/5ML PO SYRP: 5.0000 mL | ORAL_SOLUTION | Freq: Three times a day (TID) | ORAL | 0 refills | Status: DC | PRN

## 2017-05-08 NOTE — Progress Notes (Signed)
Subjective:  Patient ID: Roberto Horne, male    DOB: 05/27/1961  Age: 56 y.o. MRN: [id number]  CC: Cough (coughing yellow/green,wheezing,cant sleep,--going on for 1 wk. inhaler didnt help)   Asthma  He complains of chest tightness, difficulty breathing, frequent throat clearing, shortness of breath, sputum production and wheezing. There is no hemoptysis or hoarse voice. This is a recurrent problem. The current episode started more than 1 month ago. The problem occurs intermittently. The problem has been waxing and waning. Associated symptoms include dyspnea on exertion, malaise/fatigue and postnasal drip. Pertinent negatives include no ear congestion, ear pain, fever, headaches, heartburn, nasal congestion, orthopnea, PND, rhinorrhea, sneezing, sore throat, sweats, trouble swallowing or weight loss. His symptoms are aggravated by URI. His symptoms are alleviated by beta-agonist, change in position, prescription cough suppressant and steroid inhaler. He reports moderate improvement on treatment. His symptoms are not alleviated by OTC cough suppressant. His past medical history is significant for asthma and pneumonia.  he was evaluated and treated for pneumonia 03/25/2017 (azithromycin, oral prednisone) He was evaluated and treated for asthma exacerbation again 04/18/2017 (oral prednisone). He was evaluated and treated again for asthma exacerbation 04/29/2017 (oral prednisone). Last CXR 04/29/2017: resolved pneumonia, chronic bronchitis and pulmonary hyperinflation.  Outpatient Medications Prior to Visit  Medication Sig Dispense Refill  . albuterol (PROVENTIL HFA;VENTOLIN HFA) 108 (90 Base) MCG/ACT inhaler Inhale 2 puffs into the lungs every 4 (four) hours as needed for wheezing or shortness of breath. 6.7 g 0  . baclofen (LIORESAL) 10 MG tablet TAKE 1 TABLET BY MOUTH EVERY 8 HOURS AS NEEDED FOR SPASM  0  . colchicine 0.6 MG tablet Take 2 tablets at the onset of a flare and 1 tablet 1 hour later.  Repeat in 72 hours if needed. 6 tablet 1  . fluticasone (FLONASE) 50 MCG/ACT nasal spray Place 1 spray into both nostrils daily.     Marland Kitchen loratadine (CLARITIN) 10 MG tablet Take 10 mg by mouth daily as needed for allergies.     . metoprolol succinate (TOPROL-XL) 25 MG 24 hr tablet Take 1 tablet (25 mg total) by mouth daily. 90 tablet 3  . omeprazole (PRILOSEC OTC) 20 MG tablet Take 20 mg by mouth daily.    . predniSONE (DELTASONE) 20 MG tablet Take 2 tablets (40 mg total) daily by mouth. (Patient not taking: Reported on 05/08/2017) 10 tablet 0   Facility-Administered Medications Prior to Visit  Medication Dose Route Frequency Provider Last Rate Last Dose  . 0.9 %  sodium chloride infusion  500 mL Intravenous Continuous Nandigam, Kavitha V, MD        ROS See HPI  Objective:  BP 130/90   Pulse 92   Temp 98.1 F (36.7 C)   Ht 6\' 3"  (1.905 m)   Wt 295 lb (133.8 kg)   SpO2 98%   BMI 36.87 kg/m   BP Readings from Last 3 Encounters:  05/08/17 130/90  04/30/17 112/60  04/18/17 (!) 146/108    Wt Readings from Last 3 Encounters:  05/08/17 295 lb (133.8 kg)  03/25/17 300 lb (136.1 kg)  12/07/16 (!) 305 lb (138.3 kg)    Physical Exam  Constitutional: He is oriented to person, place, and time. No distress.  Cardiovascular: Normal rate and regular rhythm.  Pulmonary/Chest: Effort normal. No respiratory distress. He has wheezes. He has no rales.  Musculoskeletal: Normal range of motion. He exhibits no edema.  Neurological: He is alert and oriented to person, place, and time.  Skin: Skin  is warm and dry.  Vitals reviewed.   Lab Results  Component Value Date   WBC 9.4 04/29/2017   HGB 13.5 04/29/2017   HCT 42.7 04/29/2017   PLT 147 (L) 04/29/2017   GLUCOSE 91 04/29/2017   ALT 14 (L) 10/08/2015   AST 15 10/08/2015   NA 138 04/29/2017   K 3.7 04/29/2017   CL 104 04/29/2017   CREATININE 1.37 (H) 04/29/2017   BUN 20 04/29/2017   CO2 23 04/29/2017   TSH 2.234 11/07/2012    HGBA1C (H) 03/20/2010    6.3 (NOTE)                                                                       According to the ADA Clinical Practice Recommendations for 2011, when HbA1c is used as a screening test:   >=6.5%   Diagnostic of Diabetes Mellitus           (if abnormal result  is confirmed)  5.7-6.4%   Increased risk of developing Diabetes Mellitus  References:Diagnosis and Classification of Diabetes Mellitus,Diabetes GYJE,5631,49(FWYOV 1):S62-S69 and Standards of Medical Care in         Diabetes - 2011,Diabetes Care,2011,34  (Suppl 1):S11-S61.    Dg Chest 2 View  Result Date: 04/29/2017 CLINICAL DATA:  Dyspnea, asthma flare up since Friday. EXAM: CHEST  2 VIEW COMPARISON:  04/18/2017 FINDINGS: Heart is top normal with minimal aortic atherosclerosis. Chronic mild interstitial lung markings consistent chronic bronchitic change. Lungs are hyperinflated with atelectasis at the lung bases. No significant pleural effusion. No pneumothorax or overt pulmonary edema. No acute nor suspicious osseous abnormality. IMPRESSION: 1. Mild pulmonary hyperinflation with minimal atelectasis at the lung bases. 2. Chronic stable bronchitic change of the lungs. Electronically Signed   By: Ashley Royalty M.D.   On: 04/29/2017 18:16    Assessment & Plan:   Lisa was seen today for cough.  Diagnoses and all orders for this visit:  Moderate persistent asthma with acute exacerbation -     budesonide-formoterol (SYMBICORT) 80-4.5 MCG/ACT inhaler; Inhale 1 puff 2 (two) times daily at 10 am and 4 pm into the lungs. -     promethazine-dextromethorphan (PROMETHAZINE-DM) 6.25-15 MG/5ML syrup; Take 5 mLs 3 (three) times daily as needed by mouth for cough. -     dextromethorphan-guaiFENesin (MUCINEX DM) 30-600 MG 12hr tablet; Take 1 tablet 2 (two) times daily as needed by mouth for cough.   I am having Chauncy Passy start on budesonide-formoterol, promethazine-dextromethorphan, and dextromethorphan-guaiFENesin. I am  also having him maintain his loratadine, metoprolol succinate, omeprazole, fluticasone, colchicine, baclofen, albuterol, and predniSONE. We will continue to administer sodium chloride.  Meds ordered this encounter  Medications  . budesonide-formoterol (SYMBICORT) 80-4.5 MCG/ACT inhaler    Sig: Inhale 1 puff 2 (two) times daily at 10 am and 4 pm into the lungs.    Dispense:  1 Inhaler    Refill:  0    Order Specific Question:   Supervising Provider    Answer:   Cassandria Anger [[id number]]  . promethazine-dextromethorphan (PROMETHAZINE-DM) 6.25-15 MG/5ML syrup    Sig: Take 5 mLs 3 (three) times daily as needed by mouth for cough.    Dispense:  240 mL    Refill:  0    Order Specific Question:   Supervising Provider    Answer:   Cassandria Anger [[id number]]  . dextromethorphan-guaiFENesin (MUCINEX DM) 30-600 MG 12hr tablet    Sig: Take 1 tablet 2 (two) times daily as needed by mouth for cough.    Dispense:  14 tablet    Refill:  0    Order Specific Question:   Supervising Provider    Answer:   Cassandria Anger [[id number]]    Follow-up: No Follow-up on file.  Wilfred Lacy, NP

## 2017-05-09 NOTE — Patient Instructions (Addendum)
demonstrated proper inhaler use and used feedback demonstration to make sure he understand.  Encourage adequate oral hydration.  Return to office if no improvement in 1week or f/up with pulmonology.

## 2017-05-21 ENCOUNTER — Other Ambulatory Visit: Payer: Self-pay

## 2017-05-21 ENCOUNTER — Emergency Department (HOSPITAL_COMMUNITY)
Admission: EM | Admit: 2017-05-21 | Discharge: 2017-05-21 | Disposition: A | Discharge status: Home / Self Care | Payer: Medicare Other | Attending: Emergency Medicine | Admitting: Emergency Medicine

## 2017-05-21 ENCOUNTER — Emergency Department (HOSPITAL_COMMUNITY): Payer: Medicare Other

## 2017-05-21 ENCOUNTER — Encounter (HOSPITAL_COMMUNITY): Payer: Self-pay | Admitting: Neurology

## 2017-05-21 DIAGNOSIS — J4521 Mild intermittent asthma with (acute) exacerbation: Secondary | ICD-10-CM | POA: Diagnosis not present

## 2017-05-21 DIAGNOSIS — I10 Essential (primary) hypertension: Secondary | ICD-10-CM | POA: Insufficient documentation

## 2017-05-21 DIAGNOSIS — Z886 Allergy status to analgesic agent status: Secondary | ICD-10-CM | POA: Insufficient documentation

## 2017-05-21 DIAGNOSIS — Z79899 Other long term (current) drug therapy: Secondary | ICD-10-CM | POA: Insufficient documentation

## 2017-05-21 DIAGNOSIS — Z7951 Long term (current) use of inhaled steroids: Secondary | ICD-10-CM | POA: Diagnosis not present

## 2017-05-21 DIAGNOSIS — R062 Wheezing: Secondary | ICD-10-CM | POA: Diagnosis present

## 2017-05-21 DIAGNOSIS — R05 Cough: Secondary | ICD-10-CM | POA: Diagnosis not present

## 2017-05-21 MED ORDER — BUDESONIDE-FORMOTEROL FUMARATE 80-4.5 MCG/ACT IN AERO: 2.0000 | INHALATION_SPRAY | Freq: Two times a day (BID) | RESPIRATORY_TRACT | 12 refills | Status: DC

## 2017-05-21 MED ORDER — IPRATROPIUM-ALBUTEROL 0.5-2.5 (3) MG/3ML IN SOLN
3.0000 mL | Freq: Once | RESPIRATORY_TRACT | Status: AC
  Administered 2017-05-21: 3 mL via RESPIRATORY_TRACT
  Filled 2017-05-21: qty 3

## 2017-05-21 MED ORDER — DEXAMETHASONE SODIUM PHOSPHATE 10 MG/ML IJ SOLN
10.0000 mg | Freq: Once | INTRAMUSCULAR | Status: AC
  Administered 2017-05-21: 10 mg via INTRAMUSCULAR
  Filled 2017-05-21: qty 1

## 2017-05-21 MED ORDER — ALBUTEROL SULFATE (2.5 MG/3ML) 0.083% IN NEBU
5.0000 mg | INHALATION_SOLUTION | Freq: Once | RESPIRATORY_TRACT | Status: AC
  Administered 2017-05-21: 5 mg via RESPIRATORY_TRACT
  Filled 2017-05-21: qty 6

## 2017-05-21 MED ORDER — BENZONATATE 100 MG PO CAPS
100.0000 mg | ORAL_CAPSULE | Freq: Once | ORAL | Status: AC
  Administered 2017-05-21: 100 mg via ORAL
  Filled 2017-05-21: qty 1

## 2017-05-21 MED ORDER — ALBUTEROL SULFATE HFA 108 (90 BASE) MCG/ACT IN AERS
2.0000 | INHALATION_SPRAY | Freq: Once | RESPIRATORY_TRACT | Status: AC
  Administered 2017-05-21: 2 via RESPIRATORY_TRACT
  Filled 2017-05-21: qty 6.7

## 2017-05-21 NOTE — ED Provider Notes (Signed)
[facility] EMERGENCY DEPARTMENT Provider Note   CSN: [id number] Arrival date & time: 05/21/17  1116     History   Chief Complaint Chief Complaint  Patient presents with  . Asthma    HPI Roberto Horne is a 56 y.o. male.  HPI Roberto Horne is a 56 y.o. male with history of asthma, pneumonia, sleep apnea, hypertension, presents to emergency department complaining of wheezing.  Patient states he has had wheezing and cough for about a month.  He states he has been seen in emergency department 3 weeks ago and by his primary care doctor 2 weeks ago.  He was prescribed refill on Symbicort, promethazine DM syrup, Mucinex DM.  He states he never got his medications filled.  He states that he still had some Symbicort at home, which he has been using, patient is showing me his inhaler which is on empty.  States he did not realize it was empty.  States he did run out of his albuterol.  He denies any fever or chills.  He denies nausea or vomiting.  No chest pain.  He does report some cough but states it is not productive.  Denies any nasal congestion, sore throat.  He states nothing is making symptoms better or worse.  No other complaints.  Past Medical History:  Diagnosis Date  . Allergy   . Arthritis   . Asthma   . Depression   . GERD (gastroesophageal reflux disease)   . Gout   . Hyperlipemia   . Hypertension   . Mental impairment   . Pneumonia   . Sleep apnea     Patient Active Problem List   Diagnosis Date Noted  . Mental retardation 12/03/2016  . Dental caries 12/03/2016  . Obesity 10/01/2016  . Left foot pain 08/30/2015  . Acute upper respiratory infection 08/30/2015  . Nasal polyps 07/21/2015  . Contusion, arm, upper 03/01/2015  . Hand contusion 03/01/2015  . Obstructive sleep apnea 12/16/2014  . Seasonal and perennial allergic rhinitis 09/21/2014  . Medicare annual wellness visit, subsequent 09/07/2014  . CAP (community acquired pneumonia) 07/07/2014   . Effusion of right knee 07/07/2014  . Hypertension 07/07/2014  . Abdominal pain 07/07/2014  . Pneumonia 07/07/2014  . Asthma, mild persistent 12/15/2012  . Gout, unspecified 12/15/2012  . Esophageal reflux 12/15/2012  . HTN (hypertension) 11/06/2012  . Hyperlipidemia 11/06/2012    Past Surgical History:  Procedure Laterality Date  . BRAIN SURGERY    . FUNCTIONAL ENDOSCOPIC SINUS SURGERY  10/14/2015  . SINUS ENDO W/FUSION Bilateral 10/14/2015   Procedure: ENDOSCOPIC SINUS SURGERY WITH NAVIGATION;  Surgeon: Melida Quitter, MD;  Location: [facility];  Service: ENT;  Laterality: Bilateral;       Home Medications    Prior to Admission medications   Medication Sig Start Date End Date Taking? Authorizing Provider  albuterol (PROVENTIL HFA;VENTOLIN HFA) 108 (90 Base) MCG/ACT inhaler Inhale 2 puffs into the lungs every 4 (four) hours as needed for wheezing or shortness of breath. 04/18/17   Volanda Napoleon, PA-C  baclofen (LIORESAL) 10 MG tablet TAKE 1 TABLET BY MOUTH EVERY 8 HOURS AS NEEDED FOR SPASM 02/01/17   [provider]  budesonide-formoterol (SYMBICORT) 80-4.5 MCG/ACT inhaler Inhale 1 puff 2 (two) times daily at 10 am and 4 pm into the lungs. 05/08/17   Nche, Charlene Brooke, NP  colchicine 0.6 MG tablet Take 2 tablets at the onset of a flare and 1 tablet 1 hour later. Repeat in 72 hours  if needed. 03/12/17   Golden Circle, FNP  dextromethorphan-guaiFENesin (MUCINEX DM) 30-600 MG 12hr tablet Take 1 tablet 2 (two) times daily as needed by mouth for cough. 05/08/17   Nche, Charlene Brooke, NP  fluticasone (FLONASE) 50 MCG/ACT nasal spray Place 1 spray into both nostrils daily.  10/17/16   [provider]  loratadine (CLARITIN) 10 MG tablet Take 10 mg by mouth daily as needed for allergies.     [provider]  metoprolol succinate (TOPROL-XL) 25 MG 24 hr tablet Take 1 tablet (25 mg total) by mouth daily. 10/01/16   Golden Circle, FNP  omeprazole (PRILOSEC OTC)  20 MG tablet Take 20 mg by mouth daily.    [provider]  predniSONE (DELTASONE) 20 MG tablet Take 2 tablets (40 mg total) daily by mouth. Patient not taking: Reported on 05/08/2017 04/30/17   Recardo Evangelist, PA-C  promethazine-dextromethorphan (PROMETHAZINE-DM) 6.25-15 MG/5ML syrup Take 5 mLs 3 (three) times daily as needed by mouth for cough. 05/08/17   Nche, Charlene Brooke, NP    Family History Family History  Problem Relation Age of Onset  . Arthritis Mother   . Hyperlipidemia Mother   . Hypertension Mother   . Diabetes Mother   . Stomach cancer Mother        possible, not 100 % sure  . Kidney disease Mother   . Arthritis Father   . Hypertension Father   . Diabetes Father   . Asthma Maternal Grandmother   . Rectal cancer Neg Hx   . Colon cancer Neg Hx     Social History Social History   Tobacco Use  . Smoking status: Never Smoker  . Smokeless tobacco: Never Used  Substance Use Topics  . Alcohol use: No  . Drug use: No     Allergies   Ibuprofen   Review of Systems Review of Systems  Constitutional: Negative for chills and fever.  Respiratory: Positive for cough, shortness of breath and wheezing. Negative for chest tightness.   Cardiovascular: Negative for chest pain, palpitations and leg swelling.  Gastrointestinal: Negative for abdominal distention, abdominal pain, diarrhea, nausea and vomiting.  Genitourinary: Negative for dysuria, frequency, hematuria and urgency.  Musculoskeletal: Negative for arthralgias, myalgias, neck pain and neck stiffness.  Skin: Negative for rash.  Allergic/Immunologic: Negative for immunocompromised state.  Neurological: Negative for dizziness, weakness, light-headedness, numbness and headaches.  All other systems reviewed and are negative.    Physical Exam Updated Vital Signs BP 106/81 (BP Location: Right Arm)   Pulse (!) 104   Temp 98.4 F (36.9 C)   Resp 20   Ht 6\' 3"  (1.905 m)   Wt 133.8 kg (295 lb)    SpO2 95%   BMI 36.87 kg/m   Physical Exam  Constitutional: He appears well-developed and well-nourished. No distress.  HENT:  Head: Normocephalic and atraumatic.  Eyes: Conjunctivae are normal.  Neck: Neck supple.  Cardiovascular: Normal rate, regular rhythm and normal heart sounds.  Pulmonary/Chest: Effort normal. No respiratory distress. He has wheezes. He has no rales.  Inspiratory and expiratory wheezes bilaterally  Abdominal: Soft. Bowel sounds are normal. He exhibits no distension. There is no tenderness. There is no rebound.  Musculoskeletal: He exhibits no edema.  Neurological: He is alert.  Skin: Skin is warm and dry.  Nursing note and vitals reviewed.    ED Treatments / Results  Labs (all labs ordered are listed, but only abnormal results are displayed) Labs Reviewed - No data to display  EKG  EKG Interpretation None       Radiology Dg Chest 2 View  Result Date: 05/21/2017 CLINICAL DATA:  56 year old male with a history of cough and wheezing EXAM: CHEST  2 VIEW COMPARISON:  04/29/2017 FINDINGS: Cardiomediastinal silhouette unchanged in size and contour. No evidence of central vascular congestion. Linear opacities at the lung bases on AP and lateral views, unchanged from comparison, compatible with atelectasis/scarring. No confluent airspace disease. No pleural effusion or pneumothorax. No displaced fracture.  Multilevel degenerative changes. IMPRESSION: Chronic lung changes with no evidence of superimposed acute cardiopulmonary disease Electronically Signed   By: Corrie Mckusick D.O.   On: 05/21/2017 12:05    Procedures Procedures (including critical care time)  Medications Ordered in ED Medications  ipratropium-albuterol (DUONEB) 0.5-2.5 (3) MG/3ML nebulizer solution 3 mL (not administered)  benzonatate (TESSALON) capsule 100 mg (not administered)  dexamethasone (DECADRON) injection 10 mg (not administered)  albuterol (PROVENTIL) (2.5 MG/3ML) 0.083% nebulizer  solution 5 mg (5 mg Nebulization Given 05/21/17 1134)     Initial Impression / Assessment and Plan / ED Course  I have reviewed the triage vital signs and the nursing notes.  Pertinent labs & imaging results that were available during my care of the patient were reviewed by me and considered in my medical decision making (see chart for details).     Pt with persistent wheezing and coughing over a month. Has been seen by PCP and here within last month. Ran out of both symbicort and albuterol inhalers. No fever, no respiratory distress, wheezing on exam. Will give decadron IM and several breathing treatments. Will reassess. NAD otherwise, appears comfortable in the room  2:49 PM PT feeling much better. Still wheezing however improved. VS normal. Non toxic. Stable for dc home. I tried to get pt an albuterol and symbicort inhalers here, however hospital does not have symbicort. I was going to label both, I believe pt has been using symbicort as a rescue inhaler. I did provide him with albuterol inhaler to go home with and will give another script for symbicort, pt states he is not sure where his prescription is. Will have him follow up with PCP closely for recheck. Return precautions discussd   Vitals:   05/21/17 1300 05/21/17 1315 05/21/17 1345 05/21/17 1415  BP: 118/80     Pulse: 80 87 82 78  Resp:  14 15 16   Temp:      SpO2: 96% 95% 95% 96%  Weight:      Height:         Final Clinical Impressions(s) / ED Diagnoses   Final diagnoses:  Mild intermittent asthma with exacerbation    ED Discharge Orders    None       Jeannett Senior, PA-C 05/21/17 1455    Mabe, Forbes Cellar, MD 05/21/17 1538

## 2017-05-21 NOTE — ED Triage Notes (Addendum)
Pt reports asthma flare since Saturday, can't sleep at night. Is out of his albuterol inhaler. 94% RA. Is a x 4. Pt denies any pain, expiratory wheezing noted.

## 2017-05-21 NOTE — Discharge Instructions (Signed)
Use ALBUTEROL inhaler every 4 hrs as needed for wheezing. Use Symbicort twice a day, prescription provided. Follow up closely with primary care doctor for recheck. Return if worsening symptoms.

## 2017-05-27 ENCOUNTER — Encounter (HOSPITAL_COMMUNITY): Payer: Self-pay

## 2017-05-27 ENCOUNTER — Emergency Department (HOSPITAL_COMMUNITY): Payer: Medicare Other

## 2017-05-27 ENCOUNTER — Emergency Department (HOSPITAL_COMMUNITY)
Admission: EM | Admit: 2017-05-27 | Discharge: 2017-05-28 | Disposition: A | Discharge status: Home / Self Care | Payer: Medicare Other | Attending: Emergency Medicine | Admitting: Emergency Medicine

## 2017-05-27 ENCOUNTER — Other Ambulatory Visit: Payer: Self-pay

## 2017-05-27 DIAGNOSIS — Z5321 Procedure and treatment not carried out due to patient leaving prior to being seen by health care provider: Secondary | ICD-10-CM | POA: Diagnosis not present

## 2017-05-27 DIAGNOSIS — R05 Cough: Secondary | ICD-10-CM | POA: Diagnosis not present

## 2017-05-27 DIAGNOSIS — R0602 Shortness of breath: Secondary | ICD-10-CM | POA: Diagnosis not present

## 2017-05-27 MED ORDER — ALBUTEROL SULFATE (2.5 MG/3ML) 0.083% IN NEBU
5.0000 mg | INHALATION_SOLUTION | Freq: Once | RESPIRATORY_TRACT | Status: AC
  Administered 2017-05-27: 5 mg via RESPIRATORY_TRACT
  Filled 2017-05-27: qty 6

## 2017-05-27 NOTE — ED Notes (Signed)
Called Pt for vital recheck in lobby, no response.

## 2017-05-27 NOTE — ED Notes (Signed)
Pt called from the lobby with no response x3 

## 2017-05-27 NOTE — ED Triage Notes (Signed)
Patient c/o a productive cough with white sputum. Patient has expiratory wheezing,. Patient's sats in triage was 87%on room air. Patient informed NT that he is suppose to be on home O2 but for got to put it on. Patient placed on O2 and sats increased to 93%.

## 2017-05-31 ENCOUNTER — Encounter: Payer: Self-pay | Admitting: Family

## 2017-05-31 ENCOUNTER — Ambulatory Visit (INDEPENDENT_AMBULATORY_CARE_PROVIDER_SITE_OTHER): Payer: Medicare Other | Admitting: Family

## 2017-05-31 VITALS — BP 124/86 | HR 78 | Temp 98.3°F | Ht 75.0 in | Wt 286.0 lb

## 2017-05-31 DIAGNOSIS — J4540 Moderate persistent asthma, uncomplicated: Secondary | ICD-10-CM | POA: Diagnosis not present

## 2017-05-31 DIAGNOSIS — J45909 Unspecified asthma, uncomplicated: Secondary | ICD-10-CM | POA: Diagnosis not present

## 2017-05-31 MED ORDER — DOXYCYCLINE HYCLATE 100 MG PO TABS: 100.0000 mg | ORAL_TABLET | Freq: Two times a day (BID) | ORAL | 0 refills | Status: DC

## 2017-05-31 MED ORDER — PREDNISONE 20 MG PO TABS: 40.0000 mg | ORAL_TABLET | Freq: Every day | ORAL | 0 refills | Status: DC

## 2017-05-31 MED ORDER — BUDESONIDE-FORMOTEROL FUMARATE 160-4.5 MCG/ACT IN AERO: 2.0000 | INHALATION_SPRAY | Freq: Two times a day (BID) | RESPIRATORY_TRACT | 3 refills | Status: DC

## 2017-05-31 NOTE — Progress Notes (Signed)
Roberto Horne is a 56 y.o. male with the following history as recorded in EpicCare:  Patient Active Problem List   Diagnosis Date Noted  . Mental retardation 12/03/2016  . Dental caries 12/03/2016  . Obesity 10/01/2016  . Left foot pain 08/30/2015  . Acute upper respiratory infection 08/30/2015  . Nasal polyps 07/21/2015  . Contusion, arm, upper 03/01/2015  . Hand contusion 03/01/2015  . Obstructive sleep apnea 12/16/2014  . Seasonal and perennial allergic rhinitis 09/21/2014  . Medicare annual wellness visit, subsequent 09/07/2014  . CAP (community acquired pneumonia) 07/07/2014  . Effusion of right knee 07/07/2014  . Hypertension 07/07/2014  . Abdominal pain 07/07/2014  . Pneumonia 07/07/2014  . Asthma, mild persistent 12/15/2012  . Gout, unspecified 12/15/2012  . Esophageal reflux 12/15/2012  . HTN (hypertension) 11/06/2012  . Hyperlipidemia 11/06/2012    Current Outpatient Medications  Medication Sig Dispense Refill  . albuterol (PROVENTIL HFA;VENTOLIN HFA) 108 (90 Base) MCG/ACT inhaler Inhale 2 puffs into the lungs every 4 (four) hours as needed for wheezing or shortness of breath. 6.7 g 0  . baclofen (LIORESAL) 10 MG tablet TAKE 1 TABLET BY MOUTH EVERY 8 HOURS AS NEEDED FOR SPASM  0  . colchicine 0.6 MG tablet Take 2 tablets at the onset of a flare and 1 tablet 1 hour later. Repeat in 72 hours if needed. 6 tablet 1  . dextromethorphan-guaiFENesin (MUCINEX DM) 30-600 MG 12hr tablet Take 1 tablet 2 (two) times daily as needed by mouth for cough. 14 tablet 0  . fluticasone (FLONASE) 50 MCG/ACT nasal spray Place 1 spray into both nostrils daily.     Marland Kitchen loratadine (CLARITIN) 10 MG tablet Take 10 mg by mouth daily as needed for allergies.     . metoprolol succinate (TOPROL-XL) 25 MG 24 hr tablet Take 1 tablet (25 mg total) by mouth daily. 90 tablet 3  . omeprazole (PRILOSEC OTC) 20 MG tablet Take 20 mg by mouth daily.    . predniSONE (DELTASONE) 20 MG tablet Take 2 tablets (40 mg  total) by mouth daily. 10 tablet 0  . promethazine-dextromethorphan (PROMETHAZINE-DM) 6.25-15 MG/5ML syrup Take 5 mLs 3 (three) times daily as needed by mouth for cough. 240 mL 0  . budesonide-formoterol (SYMBICORT) 160-4.5 MCG/ACT inhaler Inhale 2 puffs into the lungs 2 (two) times daily. 1 Inhaler 3  . doxycycline (VIBRA-TABS) 100 MG tablet Take 1 tablet (100 mg total) by mouth 2 (two) times daily. 20 tablet 0   Current Facility-Administered Medications  Medication Dose Route Frequency Provider Last Rate Last Dose  . 0.9 %  sodium chloride infusion  500 mL Intravenous Continuous Nandigam, Venia Minks, MD        Allergies: Ibuprofen  Past Medical History:  Diagnosis Date  . Allergy   . Arthritis   . Asthma   . Depression   . GERD (gastroesophageal reflux disease)   . Gout   . Hyperlipemia   . Hypertension   . Mental impairment   . Pneumonia   . Sleep apnea     Past Surgical History:  Procedure Laterality Date  . BRAIN SURGERY    . FUNCTIONAL ENDOSCOPIC SINUS SURGERY  10/14/2015  . SINUS ENDO W/FUSION Bilateral 10/14/2015   Procedure: ENDOSCOPIC SINUS SURGERY WITH NAVIGATION;  Surgeon: Melida Quitter, MD;  Location: [facility] OR;  Service: ENT;  Laterality: Bilateral;    Family History  Problem Relation Age of Onset  . Arthritis Mother   . Hyperlipidemia Mother   . Hypertension Mother   .  Diabetes Mother   . Stomach cancer Mother        possible, not 100 % sure  . Kidney disease Mother   . Arthritis Father   . Hypertension Father   . Diabetes Father   . Asthma Maternal Grandmother   . Rectal cancer Neg Hx   . Colon cancer Neg Hx     Social History   Tobacco Use  . Smoking status: Never Smoker  . Smokeless tobacco: Never Used  Substance Use Topics  . Alcohol use: No    Subjective:  Patient is accompanied by his sister-in-law today regarding his chronic cough/ asthma. He was diagnosed with pneumonia at the end of October and seen here in follow-up in mid-November. The  pneumonia had resolved and he was prescribed Symbicort to help treat and control his asthma. Unfortunately, the patient did not understand these instructions. He brings his albuterol inhaler ( Proventil HFA) which he has been using to treat the cough.  In reviewing his notes, he has to been to the emergency room multiple times in the past month for his cough/ asthma. He was actually there earlier this week. He had a normal CXR but admits he left before he was actually seen because "he thought they had forgotten him."  Objective:  Vitals:   05/31/17 1317  BP: 124/86  Pulse: 78  Temp: 98.3 F (36.8 C)  TempSrc: Oral  SpO2: 96%  Weight: 286 lb (129.7 kg)  Height: 6\' 3"  (1.905 m)    General: Well developed, well nourished, in no acute distress  Skin : Warm and dry.  Head: Normocephalic and atraumatic  Eyes: Sclera and conjunctiva clear; pupils round and reactive to light; extraocular movements intact  Ears: External normal; canals clear; tympanic membranes normal  Oropharynx: Pink, supple. No suspicious lesions  Neck: Supple without thyromegaly, adenopathy  Lungs: Respirations unlabored; resolution of wheezing with duo-neb breathing treatment CVS exam: normal rate and regular rhythm.  Neurologic: Alert and oriented; speech intact; face symmetrical; moves all extremities well; CNII-XII intact without focal deficit  Assessment:  1. Moderate persistent asthma, unspecified whether complicated   2. Acute asthmatic bronchitis     Plan:  Unfortunately, patient has become very confused about his medications. He has not been taking his daily asthma inhaler and has only been using his rescue inhaler occasionally. Nebulizer with Duo-Neb given with good relief; Will treat with Prednisone 40 mg qd x 5 days, Doxycycline 100 mg bid x 10 days and re-start Symbicort 160/4.5; I went over his medications with both he and his sister in law. They both express understanding. He is also referred to pulmonology  for second opinion about his asthma and changes that have been noted on recent CXRs.  Upon leaving, his sister in law, states that she does not think he has been taking blood pressure medication as she has not gotten it refilled for him in months. As his blood pressure is controlled today, I do not want to start any new medications. They will return in one week and bring all medications for extensive medication review.   Return in about 1 week (around 06/07/2017).  Orders Placed This Encounter  Procedures  . Ambulatory referral to Pulmonology    Referral Priority:   Routine    Referral Type:   Consultation    Referral Reason:   Specialty Services Required    Requested Specialty:   Pulmonary Disease    Number of Visits Requested:   1    Requested  Prescriptions   Signed Prescriptions Disp Refills  . doxycycline (VIBRA-TABS) 100 MG tablet 20 tablet 0    Sig: Take 1 tablet (100 mg total) by mouth 2 (two) times daily.  . budesonide-formoterol (SYMBICORT) 160-4.5 MCG/ACT inhaler 1 Inhaler 3    Sig: Inhale 2 puffs into the lungs 2 (two) times daily.  . predniSONE (DELTASONE) 20 MG tablet 10 tablet 0    Sig: Take 2 tablets (40 mg total) by mouth daily.

## 2017-06-05 ENCOUNTER — Other Ambulatory Visit: Payer: Self-pay | Admitting: *Deleted

## 2017-06-05 ENCOUNTER — Ambulatory Visit: Payer: Medicare Other | Admitting: Family

## 2017-06-05 NOTE — Patient Outreach (Signed)
[facility]) Care Management  06/05/2017  Roberto Horne 1960-11-04 [id number]   RN Health Coach telephone call to patient.  No answer. Mobile phone disconnected. RN Telephone call to Hillard Danker sister. She is at work and will call me back on her lunch break.  Plan: Sister will call back on lunch break  [facility] Management [phone].

## 2017-06-06 ENCOUNTER — Encounter: Payer: Self-pay | Admitting: *Deleted

## 2017-06-06 ENCOUNTER — Other Ambulatory Visit: Payer: Self-pay | Admitting: *Deleted

## 2017-06-06 NOTE — Patient Outreach (Signed)
Roberto Horne [facility]) Care Management  06/06/2017  Roberto Horne 1961-04-03 [id number]   Referral Date:05/23/2017 Referral Source:[facility] ED [facility] Referral Reason: ED utilization [name] received return telephone call from patient sister in law Roberto Horne. She has POA. Patient lives with her since his mother died two years ago. Hipaa compliance verified. Per Roberto Horne this patient is mentally challenged. He has to be told to do the basic ADL. Patient needs assistance with medications. Patient is unable to afford some of the medicines and he is not taking medications as per ordered. Patient needs pill box and organizing medication. Per Roberto Horne they would like a list and help with looking at assisted living places for this patient to live. Patient also needs assistance with transportation. Please call Roberto Horne at phone number listed and leave message. She is at work but will call you back.   Plan: Referred to social worker Referred to pharmacy  [name] Management [phone]

## 2017-06-06 NOTE — Patient Outreach (Signed)
Roberto Horne [facility]) Care Management  06/06/2017  Roberto Horne [birth year] [id number]   CSW made an initial attempt to try and contact patient's sister/caregiver, Roberto Horne today to perform phone assessment on patient, as well as assess and assist with social needs and services, without success.  A HIPAA compliant message was left for Ms. Ripple on voicemail.  CSW is currently awaiting a return call. CSW will make a second outreach attempt within the next week, if CSW does not receive a return call from Ms. Eichelberger in the meantime. Roberto Horne, BSW, MSW, LCSW  Licensed Education officer, environmental [facility]  Mailing [place] [address], [place], [place] [address] Physical Address-[address], [place], [place] [address] Toll Free Main # [phone] Fax # [phone] Cell # [phone]  Office # [phone] [email or web address]

## 2017-06-10 ENCOUNTER — Other Ambulatory Visit: Payer: Self-pay | Admitting: *Deleted

## 2017-06-10 ENCOUNTER — Ambulatory Visit: Payer: Self-pay | Admitting: *Deleted

## 2017-06-10 NOTE — Patient Outreach (Signed)
[facility]) Care Management  06/10/2017  Roberto Horne [birth year] [id number]   CSW made a second attempt to try and contact patient's sister, Dewey Viens today to perform the phone assessment on patient, as well as assess and assist with social needs and services, without success.  A HIPAA compliant message was left for Ms. Spraker on voicemail.  CSW is currently awaiting a return call. CSW will make a second outreach attempt within the next week, if CSW does not receive a return call from Ms. Vanhoose in the meantime. Nat Christen, BSW, MSW, LCSW  Licensed Education officer, environmental Health System  Mailing [place] [address], [place], [place] [address] Physical Address-[address], [place], [place] [address] Toll Free Main # [phone] Fax # [phone] Cell # [phone]  Office # [phone] [email or web address]

## 2017-06-14 ENCOUNTER — Ambulatory Visit: Payer: Self-pay | Admitting: *Deleted

## 2017-06-17 ENCOUNTER — Other Ambulatory Visit: Payer: Self-pay | Admitting: Pharmacist

## 2017-06-17 NOTE — Patient Outreach (Addendum)
[facility]) Care Management  06/17/2017  Witt Plitt 1961-01-19 [id number]  Patient was referred to [place] Pharmacist by [facility] RN Joaquim Lai for medication compliance concerns and medication affordability concerns.    Per referral from Gala Murdoch, patient's sister in law, is point of contact for patient.  Successful phone outreach to [place], HIPAA details verified, purpose of call explained and she agreed to continue.  She reports concerns with patient taking his medications and affording his medications.  She reports she doesn't believe patient has Medicare Part D at this time.   Reviewed medicare website and it appears patient may not have Part D plan, but has one selected for 2019.   Discussed SSA Extra Help---[name] believes patient had this then lost it for some reason.  Encouraged [name] to complete SSA Extra Help application  Also discussed options for pill packaging such as blister packaging of medication.    She reports she is unable to review patient's at this time.  She does reports cost concerns with Symbicort.  Reviewed patient assistance options---limited with end of year and patient potentially having Part D plan starting 06/25/17.  Noted there is a free 30 day trial card patient can sign up for on symbicort manufacturer website---provided this information to Franklyn Lor is unsure if patient has used free trial card before but verbalizes understanding this may be an option for a 30 day supply if he has not used in the past.   Provided [name] with [facility] LCSW Joanna's, phone number and encouraged her to call Joann back regarding community resources for patient.   Plan:  Will place follow-up call to patient's sister in law in the next 2 weeks.    Karrie Meres, PharmD, [place] [phone]

## 2017-06-20 ENCOUNTER — Encounter: Payer: Self-pay | Admitting: *Deleted

## 2017-06-20 ENCOUNTER — Other Ambulatory Visit: Payer: Self-pay | Admitting: *Deleted

## 2017-06-20 NOTE — Patient Outreach (Signed)
Roberto Horne [facility]) Care Management  06/20/2017  [name] 12-Feb-1961 [id number]   CSW made a third and final attempt to try and contact patient today to perform phone assessment, as well as assess and assist with social work needs and services, without success.  A HIPAA compliant message was left for patient on voicemail.  CSW  continues to await a return call.  CSW will mail an outreach letter to patient's home, encouraging patient to contact CSW at their earliest convenience, if patient is interested in receiving social work services through [place] with [facility].  If CSW does not receive a return call from patient within the next 10 business days, CSW will proceed with case closure.  Required number of phone attempts will have been made and outreach letter mailed.   Nat Christen, BSW, MSW, LCSW  Licensed Education officer, environmental Health System  Mailing [place] [address], [place], [place] [address] Physical Address[address], [place], [place] [address] Toll Free Main # [phone] Fax # [phone] Cell # [phone]  Office # [phone] [email or web address]

## 2017-06-28 ENCOUNTER — Ambulatory Visit: Payer: Self-pay | Admitting: *Deleted

## 2017-06-28 ENCOUNTER — Other Ambulatory Visit: Payer: Self-pay | Admitting: Pharmacist

## 2017-06-28 NOTE — Patient Outreach (Signed)
[facility]) Care Management  06/28/2017  Roberto Horne 08-07-1960 [id number]  Unsuccessful phone outreach attempt to patient's sister-in-law, [name], noted point of contact per referral from [place].   HIPAA compliant message left requesting return call.   Plan:  Second phone outreach attempt next week.     Karrie Meres, PharmD, [place] [phone]

## 2017-07-04 ENCOUNTER — Encounter: Payer: Self-pay | Admitting: *Deleted

## 2017-07-04 ENCOUNTER — Other Ambulatory Visit: Payer: Self-pay | Admitting: Pharmacist

## 2017-07-04 ENCOUNTER — Other Ambulatory Visit: Payer: Self-pay | Admitting: *Deleted

## 2017-07-04 NOTE — Patient Outreach (Signed)
[facility]) Care Management  07/04/2017  Wane Mollett 02/25/1961 [id number]  Second unsuccessful phone outreach attempt to patient's sister-in-law, [name], noted point of contact per referral from [place].    HIPAA compliant message left requesting return call.   Plan:  Third phone outreach attempt in the next week.   Karrie Meres, PharmD, [place] [phone]

## 2017-07-04 NOTE — Patient Outreach (Signed)
[facility] [facility]) Care Management  07/04/2017  Rosemary Pentecost 1960-09-12 [id number]   CSW will perform a case closure on patient, due to inability to establish initial phone contact, despite required number of phone attempts made and outreach letter mailed to patient's home, allowing 10 business days for a response.  CSW will fax an update to patient's Primary Care Physician, Dr. Mauricio Po to ensure that they are aware of CSW's involvement with patient's plan of care.  CSW will submit a case closure request to Alycia Rossetti, Care Management Assistant with [place] [facility], in the form of an [employer].   Nat Christen, BSW, MSW, LCSW  Licensed Education officer, [facility]  Mailing [address], [place], [place] [address] Physical Address-[address], [place], [place] [address] Toll Free Main # [phone] Fax # [phone] Cell # [phone]  Office # [phone] [email or web address]

## 2017-07-05 ENCOUNTER — Other Ambulatory Visit: Payer: Self-pay | Admitting: Pharmacist

## 2017-07-05 NOTE — Patient Outreach (Signed)
[facility]) Care Management  07/05/2017  Roberto Horne 1961/03/23 [id number]  Third unsuccessful phone outreach to patient's sister-in-law, noted point of contact from referral from [place].  HIPAA compliant message left requesting return call.   Noted [facility] LCSW was unable to maintain contact as well.     Plan:  Outreach letter will be sent.  If no response in 10 business days, will close pharmacy episode.   Karrie Meres, PharmD, [place] [phone]

## 2017-07-08 ENCOUNTER — Telehealth: Payer: Self-pay | Admitting: Internal Medicine

## 2017-07-08 MED ORDER — BUDESONIDE-FORMOTEROL FUMARATE 160-4.5 MCG/ACT IN AERO: 2.0000 | INHALATION_SPRAY | Freq: Two times a day (BID) | RESPIRATORY_TRACT | 0 refills | Status: DC

## 2017-07-08 NOTE — Telephone Encounter (Signed)
lmtcb X1 for Roberto Horne. Sample left up front for pickup.   Patient needs to check or bring in insurance medication formulary to see what alternatives are cheaper.

## 2017-07-10 NOTE — Telephone Encounter (Signed)
ATC pt's wife, no answer. Left message for [name]  to call back.

## 2017-07-11 ENCOUNTER — Other Ambulatory Visit: Payer: Self-pay | Admitting: Pharmacist

## 2017-07-11 NOTE — Patient Outreach (Signed)
[facility]) Care Management  07/11/2017  Adel Burch 07-04-1960 [id number]   Late entry for 07/11/17.    Received a return call from patient's sister-in-law [name] today.  Successful return call to [facility], HIPAA details of patient verified.  [name] reports patient has Medicare Part D plan through [facility], she believes Medicare Rx Saver Plus plan.    Reviewed plan summary of benefits and preferred drug list.  Symbicort appears to be Tier 3, and plan appears to have $415 deductible.  [name] reports Symbicort was >$300, this may have been due to deductible not being met.  [name] reports getting sample from prescriber.   [name] reports she completed Social Security Extra Help application for patient.   [name] understands they will need to wait to hear from [place] regarding his Extra Help application.  If he is eligible for Social Security Extra Help, it may affect his co-pays/co-insurance.   Discussed as patient has Medicare Part D, he may need to be denied Social Security Extra Help before applying to manufacturer patient assistance, which may also require an out-of-pocket prescription spend.    [name] reports she will contact [facility] Pharmacist if needed again in the future.   Plan;  Case closed at this time per sister-in-law request.   Karrie Meres, PharmD, [place] [phone]

## 2017-07-11 NOTE — Patient Outreach (Signed)
[facility]) Care Management  07/11/2017  Roberto Horne 1961-02-09 [id number]   Received a message from patient's sister-in-law, [name] yesterday.   Attempted to return call today---call went directly to voicemail.  HIPAA compliant message left requesting return call.   Outreach letter has also been sent due to inability to maintain contact.   Plan:  If no return call, will consider case closure after 10 business days.    Karrie Meres, PharmD, [place] [phone]

## 2017-07-11 NOTE — Telephone Encounter (Signed)
lmtcb x2 for pt's wife. 

## 2017-07-12 NOTE — Telephone Encounter (Signed)
lmtcb x3 for pt. 

## 2017-08-21 ENCOUNTER — Telehealth: Payer: Self-pay | Admitting: Internal Medicine

## 2017-08-21 MED ORDER — BUDESONIDE-FORMOTEROL FUMARATE 160-4.5 MCG/ACT IN AERO: 2.0000 | INHALATION_SPRAY | Freq: Two times a day (BID) | RESPIRATORY_TRACT | 3 refills | Status: DC

## 2017-08-21 MED ORDER — AEROCHAMBER MV MISC: 0 refills | Status: DC

## 2017-08-21 MED ORDER — ALBUTEROL SULFATE HFA 108 (90 BASE) MCG/ACT IN AERS: 2.0000 | INHALATION_SPRAY | RESPIRATORY_TRACT | 2 refills | Status: DC | PRN

## 2017-08-21 MED ORDER — BUDESONIDE-FORMOTEROL FUMARATE 160-4.5 MCG/ACT IN AERO: 2.0000 | INHALATION_SPRAY | Freq: Two times a day (BID) | RESPIRATORY_TRACT | 6 refills | Status: DC

## 2017-08-21 NOTE — Telephone Encounter (Signed)
Pt's sister-n-law came to pick up spacer for pt.  Had her sign the delivery ticket form from [facility] and stated to her if pt's insurance did not cover the spacer they might receive a bill regarding it.  [name] expressed understanding.  Spacer Rx printed out and placed with delivery ticket from [facility]  Nothing further needed at this current time.

## 2017-08-21 NOTE — Telephone Encounter (Signed)
Called and spoke with pt's sister [name] letting her know we had received a refill request of both the symbicort and albuterol.  [name] stated to me pt is unable to receive another refill of symbicort until 08/28/17 due to pt not doing med correctly.  When I sent Rx of symbicort, I specified that on there for start date to be 08/28/17.  [name] also stated to me that she thought pt might need a spacer to be able to get better relief and to be able to use symbicort correctly and I stated to her we carried the spacers at our office.  She stated she might come by later today to get a spacer from our office.    If [name] does come by the office to get spacer, we would then print the Rx out for spacer and have it signed by Dr. Annamaria Boots.  At this current time, nothing further is needed.

## 2017-08-29 ENCOUNTER — Ambulatory Visit (INDEPENDENT_AMBULATORY_CARE_PROVIDER_SITE_OTHER): Payer: Medicare Other | Admitting: Acute Care

## 2017-08-29 ENCOUNTER — Encounter: Payer: Self-pay | Admitting: Acute Care

## 2017-08-29 VITALS — BP 160/102 | HR 61 | Ht 72.0 in | Wt 293.4 lb

## 2017-08-29 DIAGNOSIS — F79 Unspecified intellectual disabilities: Secondary | ICD-10-CM | POA: Diagnosis not present

## 2017-08-29 DIAGNOSIS — I10 Essential (primary) hypertension: Secondary | ICD-10-CM

## 2017-08-29 DIAGNOSIS — J4530 Mild persistent asthma, uncomplicated: Secondary | ICD-10-CM | POA: Diagnosis not present

## 2017-08-29 DIAGNOSIS — R05 Cough: Secondary | ICD-10-CM

## 2017-08-29 DIAGNOSIS — R059 Cough, unspecified: Secondary | ICD-10-CM

## 2017-08-29 MED ORDER — BUDESONIDE-FORMOTEROL FUMARATE 160-4.5 MCG/ACT IN AERO: 2.0000 | INHALATION_SPRAY | Freq: Two times a day (BID) | RESPIRATORY_TRACT | 6 refills | Status: DC

## 2017-08-29 MED ORDER — FLUTICASONE PROPIONATE 50 MCG/ACT NA SUSP: 1.0000 | Freq: Every day | NASAL | 3 refills | Status: DC

## 2017-08-29 MED ORDER — METHYLPREDNISOLONE ACETATE 80 MG/ML IJ SUSP
120.0000 mg | Freq: Once | INTRAMUSCULAR | Status: AC
  Administered 2017-08-29: 120 mg via INTRAMUSCULAR

## 2017-08-29 MED ORDER — METOPROLOL SUCCINATE ER 25 MG PO TB24: 25.0000 mg | ORAL_TABLET | Freq: Every day | ORAL | 0 refills | Status: DC

## 2017-08-29 MED ORDER — LEVALBUTEROL HCL 0.63 MG/3ML IN NEBU
0.6300 mg | INHALATION_SOLUTION | Freq: Once | RESPIRATORY_TRACT | Status: AC
  Administered 2017-08-29: 0.63 mg via RESPIRATORY_TRACT

## 2017-08-29 MED ORDER — BUDESONIDE-FORMOTEROL FUMARATE 160-4.5 MCG/ACT IN AERO: 2.0000 | INHALATION_SPRAY | Freq: Two times a day (BID) | RESPIRATORY_TRACT | 0 refills | Status: DC

## 2017-08-29 MED ORDER — DOXYCYCLINE HYCLATE 100 MG PO TABS: 100.0000 mg | ORAL_TABLET | Freq: Two times a day (BID) | ORAL | 0 refills | Status: DC

## 2017-08-29 NOTE — Addendum Note (Signed)
Addended by: Jannette Spanner on: 08/29/2017 11:05 AM   Modules accepted: Orders

## 2017-08-29 NOTE — Assessment & Plan Note (Signed)
Blood pressure elevated today. Pt. Is out of Toprol Plan: Toprol XL 25 mg daily ( We will prescribe 7 pills today.)  Please follow up with PCP this week about your Blood Pressure.( Send to [facility] to make appointment ) Follow up in 2 weeks with Dr. Annamaria Boots or Judson Roch NP Please contact office for sooner follow up if symptoms do not improve or worsen or seek emergency care  NWe will send a consult for social work to help with your medications.

## 2017-08-29 NOTE — Assessment & Plan Note (Addendum)
Flare Not using Symbicort Maintenance Green secretions, wheezing and cough Plan: We will do a CXR today Xopenex neb treatment now. We will call you with results. We will prescribe Symbicort 2 puffs twice daily in the morning and in the evening.Use this every day. We will give you prednisone shot today.( Depo Medrol 120) We will prescribe Doxycycline 100 mg two times a day. This is an antibiotic, take until it is gone. Take with a full glass of water. Flu shot upon return if better. Follow up in 2 weeks with Dr. Annamaria Boots or Judson Roch NP Please contact office for sooner follow up if symptoms do not improve or worsen or seek emergency care  Nasal saline for nasal congestion. Flonase 2 squirts up each nostril daily for sinus congestion. We will send a consult for social work to help with your medications. We will label inhalers today to help with compliance

## 2017-08-29 NOTE — Patient Instructions (Addendum)
It is good to see you today. We will do a CXR today Xopenex neb treatment now. We will call you with results. We will prescribe Symbicort 2 puffs twice daily in the morning and in the evening.Use this every day. We will give you prednisone shot today.( Depo Medrol 120) We will prescribe Doxycycline 100 mg two times a day. This is an antibiotic, take until it is gone. Take with a full glass of water. Toprol XL 25 mg daily ( We will prescribe 7 pills today.) Please follow up with PCP this week about your Blood Pressure. Flu shot upon return if better. Follow up in 2 weeks with Dr. Annamaria Boots or Judson Roch NP Please contact office for sooner follow up if symptoms do not improve or worsen or seek emergency care  Nasal saline for nasal congestion. Flonase 2 squirts up each nostril daily for sinus congestion. We will send a consult for social work to help with your medications.

## 2017-08-29 NOTE — Progress Notes (Signed)
History of Present Illness Roberto Horne is a 57 y.o. male with OSA  , asthma and chronic seasonal rhinitis. He is followed by Dr. Annamaria Boots.   08/29/2017 Pt. Presents for Acute OV: Pt. Was last seen in the office 11/2016 for OSA maintenance. He is here today for cough and wheezing with cough. He states this feels like a flare of his asthma.He has been using his albuterol once in the morning and once at night.He has not been using his Symbicort. He states he has run out of his Symbicort. He states he is coughing up green secretions. He denies fever. He states he has not had his flu vaccine this year.His BP is high today. He has run out of his Toprol XL. He denies fever, chest pain, orthopnea or hemoptysis. Pt. Is mentally challenged and is confused about his medications. He does have some help from his sister in law, but he is open to additional help.He often runs out of his medications . He does not anticipate needing refills without lapse in therapy. We will request a social work consult for assistance.  Consider adding Singulair to daily regimen  Test Results: DG chest 08/29/2017  CBC Latest Ref Rng & Units 04/29/2017 07/04/2016 10/11/2015  WBC 4.0 - 10.5 K/uL 9.4 10.7(H) 9.1  Hemoglobin 13.0 - 17.0 g/dL 13.5 13.3 13.2  Hematocrit 39.0 - 52.0 % 42.7 42.1 40.8  Platelets 150 - 400 K/uL 147(L) 220 188    BMP Latest Ref Rng & Units 04/29/2017 07/04/2016 10/14/2015  Glucose 65 - 99 mg/dL 91 110(H) 96  BUN 6 - 20 mg/dL 20 13 15   Creatinine 0.61 - 1.24 mg/dL 1.37(H) 0.86 0.87  Sodium 135 - 145 mmol/L 138 142 139  Potassium 3.5 - 5.1 mmol/L 3.7 4.1 3.9  Chloride 101 - 111 mmol/L 104 108 107  CO2 22 - 32 mmol/L 23 26 23   Calcium 8.9 - 10.3 mg/dL 9.0 9.4 8.9    BNP    Component Value Date/Time   BNP 18.7 07/07/2014 1659    ProBNP No results found for: PROBNP  PFT No results found for: FEV1PRE, FEV1POST, FVCPRE, FVCPOST, TLC, DLCOUNC, PREFEV1FVCRT, PSTFEV1FVCRT  No results found.   Past  medical hx Past Medical History:  Diagnosis Date  . Allergy   . Arthritis   . Asthma   . Depression   . GERD (gastroesophageal reflux disease)   . Gout   . Hyperlipemia   . Hypertension   . Mental impairment   . Pneumonia   . Sleep apnea      Social History   Tobacco Use  . Smoking status: Never Smoker  . Smokeless tobacco: Never Used  Substance Use Topics  . Alcohol use: No  . Drug use: No    Roberto Horne reports that  has never smoked. he has never used smokeless tobacco. He reports that he does not drink alcohol or use drugs.  Tobacco Cessation: Never smoker  Past surgical hx, Family hx, Social hx all reviewed.  Current Outpatient Medications on File Prior to Visit  Medication Sig  . albuterol (PROVENTIL HFA;VENTOLIN HFA) 108 (90 Base) MCG/ACT inhaler Inhale 2 puffs into the lungs every 4 (four) hours as needed for wheezing or shortness of breath.  . baclofen (LIORESAL) 10 MG tablet TAKE 1 TABLET BY MOUTH EVERY 8 HOURS AS NEEDED FOR SPASM  . colchicine 0.6 MG tablet Take 2 tablets at the onset of a flare and 1 tablet 1 hour later. Repeat in 72 hours if  needed.  Marland Kitchen dextromethorphan-guaiFENesin (MUCINEX DM) 30-600 MG 12hr tablet Take 1 tablet 2 (two) times daily as needed by mouth for cough.  . loratadine (CLARITIN) 10 MG tablet Take 10 mg by mouth daily as needed for allergies.   Marland Kitchen omeprazole (PRILOSEC OTC) 20 MG tablet Take 20 mg by mouth daily.  Marland Kitchen Spacer/Aero-Holding Chambers (AEROCHAMBER MV) inhaler Use as instructed   Current Facility-Administered Medications on File Prior to Visit  Medication  . 0.9 %  sodium chloride infusion     Allergies  Allergen Reactions  . Ibuprofen Itching    Review Of Systems:  Constitutional:   No  weight loss, night sweats,  Fevers, chills, fatigue, or  lassitude.  HEENT:   No headaches,  Difficulty swallowing,  Tooth/dental problems, or  Sore throat,                No sneezing, itching, ear ache, nasal congestion, post nasal  drip,   CV:  No chest pain,  Orthopnea, PND, swelling in lower extremities, anasarca, dizziness, palpitations, syncope.   GI  No heartburn, indigestion, abdominal pain, nausea, vomiting, diarrhea, change in bowel habits, loss of appetite, bloody stools.   Resp: No shortness of breath with exertion or at rest.  + excess mucus, + productive cough,  No non-productive cough,  No coughing up of blood.  + change in color of mucus.  + wheezing.  No chest wall deformity  Skin: no rash or lesions.  GU: no dysuria, change in color of urine, no urgency or frequency.  No flank pain, no hematuria   MS:  No joint pain or swelling.  No decreased range of motion.  No back pain.  Psych:  No change in mood or affect. No depression or anxiety.  No memory loss.   Vital Signs BP (!) 160/102 (BP Location: Left Arm, Cuff Size: Normal)   Pulse 61   Ht 6' (1.829 m)   Wt 293 lb 6.4 oz (133.1 kg)   SpO2 94%   BMI 39.79 kg/m    Physical Exam:  General- No distress,  A&Ox3, pleasant ENT: No sinus tenderness, TM clear, pale nasal mucosa, no oral exudate,+ post nasal drip, no LAN Cardiac: S1, S2, regular rate and rhythm, no murmur Chest: + wheeze/ No rales/ dullness; no accessory muscle use, no nasal flaring, no sternal retractions Abd.: Soft Non-tender, non-distended, Obese Ext: No clubbing cyanosis, edema Neuro:  normal strength, MAE x4, A&O x 3, slow processing Skin: No rashes, warm and dry Psych: normal mood and behavior, overwhelmed by his medication needs   Assessment/Plan  Asthma, mild persistent Flare Not using Symbicort Maintenance Green secretions, wheezing and cough Plan: We will do a CXR today Xopenex neb treatment now. We will call you with results. We will prescribe Symbicort 2 puffs twice daily in the morning and in the evening.Use this every day. We will give you prednisone shot today.( Depo Medrol 120) We will prescribe Doxycycline 100 mg two times a day. This is an antibiotic,  take until it is gone. Take with a full glass of water. Flu shot upon return if better. Follow up in 2 weeks with Dr. Annamaria Boots or Judson Roch NP Please contact office for sooner follow up if symptoms do not improve or worsen or seek emergency care  Nasal saline for nasal congestion. Flonase 2 squirts up each nostril daily for sinus congestion. We will send a consult for social work to help with your medications. We will label inhalers today to help with  compliance    HTN (hypertension) Blood pressure elevated today. Pt. Is out of Toprol Plan: Toprol XL 25 mg daily ( We will prescribe 7 pills today.)  Please follow up with PCP this week about your Blood Pressure.( Send to [facility] to make appointment ) Follow up in 2 weeks with Dr. Annamaria Boots or Judson Roch NP Please contact office for sooner follow up if symptoms do not improve or worsen or seek emergency care  NWe will send a consult for social work to help with your medications.      Magdalen Spatz, NP 08/29/2017  10:51 AM

## 2017-08-30 ENCOUNTER — Telehealth: Payer: Self-pay | Admitting: Acute Care

## 2017-08-30 NOTE — Telephone Encounter (Signed)
Lets do a [facility] referral instead. This may be a better fit for him. Cancel social work referral. [facility]

## 2017-08-30 NOTE — Telephone Encounter (Signed)
Roberto Horne has taken care of this order

## 2017-09-03 ENCOUNTER — Ambulatory Visit: Payer: Medicare Other | Admitting: Internal Medicine

## 2017-09-03 DIAGNOSIS — Z0289 Encounter for other administrative examinations: Secondary | ICD-10-CM

## 2017-09-04 ENCOUNTER — Other Ambulatory Visit: Payer: Self-pay

## 2017-09-04 NOTE — Patient Outreach (Signed)
[place] [facility]) Care Management  09/04/2017  Roberto Horne [birth year] [id number]   Telephone call to patient for MD screening.  Called [phone]-[facility] answers stating wrong number.  Called [phone]- no answer. HIPAA compliant voice message left.  Plan: RN CM will send letter and attempt patient again within 4 business days.  Jone Baseman, RN, MSN [facility] Care Management Care Management Coordinator Direct Line [phone] Toll Free: [phone]  Fax: [phone]

## 2017-09-06 ENCOUNTER — Other Ambulatory Visit: Payer: Self-pay

## 2017-09-06 NOTE — Patient Outreach (Signed)
[facility]) Care Management  09/06/2017  Roberto Horne 05/05/1961 [id number]   2nd telephone call to patient for MD referral screening.  No answer.  HIPAA compliant voice message left.  Plan: RN CM will attempt patient again within 5 business days.    Jone Baseman, RN, MSN [facility] Care Management Care Management Coordinator Direct Line [phone] Toll Free: [phone]  Fax: [phone]

## 2017-09-09 ENCOUNTER — Other Ambulatory Visit: Payer: Self-pay

## 2017-09-09 NOTE — Patient Outreach (Signed)
[facility] [place] [facility]) Care Management  09/09/2017  Roberto Horne 1960-06-30 [id number]   3rd telephone call to patient for MD referral screening call.  No answer.  HIPAA compliant voice message left.  Plan: RN CM will wait return phone call.  If no return call will close case.  Jone Baseman, RN, MSN [place] Care Management Care Management Coordinator Direct Line [phone] Toll Free: [phone]  Fax: [phone]

## 2017-09-12 ENCOUNTER — Encounter: Payer: Self-pay | Admitting: Acute Care

## 2017-09-12 ENCOUNTER — Ambulatory Visit (INDEPENDENT_AMBULATORY_CARE_PROVIDER_SITE_OTHER): Payer: Medicare Other | Admitting: Acute Care

## 2017-09-12 VITALS — BP 122/70 | HR 50 | Ht 74.0 in | Wt 280.0 lb

## 2017-09-12 DIAGNOSIS — I10 Essential (primary) hypertension: Secondary | ICD-10-CM

## 2017-09-12 DIAGNOSIS — J4530 Mild persistent asthma, uncomplicated: Secondary | ICD-10-CM

## 2017-09-12 NOTE — Patient Instructions (Addendum)
It is good to see you today. Continue your Symbicort 2 puffs twice daily Remember to rinse mouth after use. Remember to use albuterol inhaler for break through shortness of breath or wheezing. We will help you schedule an appointment with your PCP for check up. Follow up with Dr. Annamaria Boots in 3 months or as needed. Please contact office for sooner follow up if symptoms do not improve or worsen or seek emergency care

## 2017-09-12 NOTE — Progress Notes (Signed)
History of Present Illness Roberto Horne is a 57 y.o. male never smoker with OSA  , asthma and chronic seasonal rhinitis. He is followed by Dr. Annamaria Boots.    Pt. Presents for 2 week follow up. He was last seen 08/29/17 with an acute flare of his asthma. He was not using his maintenance medications ( Symbicort) at the time. He was treated with a,Xopenex neb treatment. Given a Depo Medrol Injection, Prescribed Doxycycline and given Symbicort samples and instruction on use. Additionally we made a social work consult for assistance with medication management. Pt. Returns today for follow up.He states he is doing better. He does still have a bit of a cough. He states it is worse at night, but it is intermittent.He is still taking his Doxycycline. We reviewed that he needs to take one tablet in the morning and one in the evening,  He states he is taking his Symbicort 2 puffs twice daily and his Albuterol as needed for breakthrough.There is still some question of how well he is managing his meds, which is why we had a social work consult. Pt. States he needs a physical.We will help him get this scheduled.He states he does get some help from his sister in law with his medications. He states he does have a pill box now that has helped.    Test Results:  CBC Latest Ref Rng & Units 04/29/2017 07/04/2016 10/11/2015  WBC 4.0 - 10.5 K/uL 9.4 10.7(H) 9.1  Hemoglobin 13.0 - 17.0 g/dL 13.5 13.3 13.2  Hematocrit 39.0 - 52.0 % 42.7 42.1 40.8  Platelets 150 - 400 K/uL 147(L) 220 188    BMP Latest Ref Rng & Units 04/29/2017 07/04/2016 10/14/2015  Glucose 65 - 99 mg/dL 91 110(H) 96  BUN 6 - 20 mg/dL 20 13 15   Creatinine 0.61 - 1.24 mg/dL 1.37(H) 0.86 0.87  Sodium 135 - 145 mmol/L 138 142 139  Potassium 3.5 - 5.1 mmol/L 3.7 4.1 3.9  Chloride 101 - 111 mmol/L 104 108 107  CO2 22 - 32 mmol/L 23 26 23   Calcium 8.9 - 10.3 mg/dL 9.0 9.4 8.9    BNP    Component Value Date/Time   BNP 18.7 07/07/2014 1659    ProBNP No  results found for: PROBNP  PFT No results found for: FEV1PRE, FEV1POST, FVCPRE, FVCPOST, TLC, DLCOUNC, PREFEV1FVCRT, PSTFEV1FVCRT  No results found.   Past medical hx Past Medical History:  Diagnosis Date  . Allergy   . Arthritis   . Asthma   . Depression   . GERD (gastroesophageal reflux disease)   . Gout   . Hyperlipemia   . Hypertension   . Mental impairment   . Pneumonia   . Sleep apnea      Social History   Tobacco Use  . Smoking status: Never Smoker  . Smokeless tobacco: Never Used  Substance Use Topics  . Alcohol use: No  . Drug use: No    [name] reports that he has never smoked. He has never used smokeless tobacco. He reports that he does not drink alcohol or use drugs.  Tobacco Cessation: Never smoker  Past surgical hx, Family hx, Social hx all reviewed.  Current Outpatient Medications on File Prior to Visit  Medication Sig  . albuterol (PROVENTIL HFA;VENTOLIN HFA) 108 (90 Base) MCG/ACT inhaler Inhale 2 puffs into the lungs every 4 (four) hours as needed for wheezing or shortness of breath.  . baclofen (LIORESAL) 10 MG tablet TAKE 1 TABLET BY MOUTH EVERY 8  HOURS AS NEEDED FOR SPASM  . budesonide-formoterol (SYMBICORT) 160-4.5 MCG/ACT inhaler Inhale 2 puffs into the lungs 2 (two) times daily.  . budesonide-formoterol (SYMBICORT) 160-4.5 MCG/ACT inhaler Inhale 2 puffs into the lungs 2 (two) times daily.  . colchicine 0.6 MG tablet Take 2 tablets at the onset of a flare and 1 tablet 1 hour later. Repeat in 72 hours if needed.  Marland Kitchen dextromethorphan-guaiFENesin (MUCINEX DM) 30-600 MG 12hr tablet Take 1 tablet 2 (two) times daily as needed by mouth for cough.  . doxycycline (VIBRA-TABS) 100 MG tablet Take 1 tablet (100 mg total) by mouth 2 (two) times daily.  . fluticasone (FLONASE) 50 MCG/ACT nasal spray Place 1 spray into both nostrils daily.  Marland Kitchen loratadine (CLARITIN) 10 MG tablet Take 10 mg by mouth daily as needed for allergies.   . metoprolol succinate  (TOPROL-XL) 25 MG 24 hr tablet Take 1 tablet (25 mg total) by mouth daily.  Marland Kitchen omeprazole (PRILOSEC OTC) 20 MG tablet Take 20 mg by mouth daily.  Marland Kitchen Spacer/Aero-Holding Chambers (AEROCHAMBER MV) inhaler Use as instructed   Current Facility-Administered Medications on File Prior to Visit  Medication  . 0.9 %  sodium chloride infusion     Allergies  Allergen Reactions  . Ibuprofen Itching    Review Of Systems:  Constitutional:   No  weight loss, night sweats,  Fevers, chills, fatigue, or  lassitude.  HEENT:   No headaches,  Difficulty swallowing,  Tooth/dental problems, or  Sore throat,                No sneezing, itching, ear ache, nasal congestion, post nasal drip,   CV:  No chest pain,  Orthopnea, PND, swelling in lower extremities, anasarca, dizziness, palpitations, syncope.   GI  No heartburn, indigestion, abdominal pain, nausea, vomiting, diarrhea, change in bowel habits, loss of appetite, bloody stools.   Resp: No shortness of breath with exertion or at rest.  + excess mucus, + productive cough,  No non-productive cough,  No coughing up of blood.  No change in color of mucus.  No wheezing.  No chest wall deformity  Skin: no rash or lesions.  GU: no dysuria, change in color of urine, no urgency or frequency.  No flank pain, no hematuria   MS:  No joint pain or swelling.  No decreased range of motion.  No back pain.  Psych:  No change in mood or affect. No depression or anxiety.  No memory loss.   Vital Signs BP 122/70 (BP Location: Left Arm, Cuff Size: Normal)   Pulse (!) 50   Ht 6\' 2"  (1.88 m)   Wt 280 lb (127 kg)   SpO2 94%   BMI 35.95 kg/m    Physical Exam:  General- No distress,  A&Ox3, pleasant ENT: No sinus tenderness, TM clear, pale nasal mucosa, no oral exudate,no post nasal drip, no LAN Cardiac: S1, S2, regular rate and rhythm, no murmur Chest: No wheeze/ rales/ dullness; no accessory muscle use, no nasal flaring, no sternal retractions, scattered rhonchi  which clear with cough Abd.: Soft Non-tender, ND obese Ext: No clubbing cyanosis, edema Neuro:  normal strength, MAE x 4, A&O x 3, MR Skin: No rashes, warm and dry Psych: normal mood and behavior, MR   Assessment/Plan  Asthma, mild persistent Flare Resolving with consistent use of maintenance inhaler Plan: Continue your Symbicort 2 puffs twice daily Remember to rinse mouth after use. Remember to use albuterol inhaler for break through shortness of breath or wheezing.  We will help you schedule an appointment with your PCP for check up. Follow up with Dr. Annamaria Boots in 3 months or as needed. Please contact office for sooner follow up if symptoms do not improve or worsen or seek emergency care      Magdalen Spatz, NP 09/12/2017  2:55 PM

## 2017-09-12 NOTE — Assessment & Plan Note (Signed)
Flare Resolving with consistent use of maintenance inhaler Plan: Continue your Symbicort 2 puffs twice daily Remember to rinse mouth after use. Remember to use albuterol inhaler for break through shortness of breath or wheezing. We will help you schedule an appointment with your PCP for check up. Follow up with Dr. Annamaria Boots in 3 months or as needed. Please contact office for sooner follow up if symptoms do not improve or worsen or seek emergency care

## 2017-09-16 ENCOUNTER — Other Ambulatory Visit: Payer: Self-pay

## 2017-09-16 NOTE — Patient Outreach (Signed)
[place] [facility]) Care Management  09/16/2017  Dickie Cloe 11/22/1960 [id number]   Referral Date: 3/08/30/17 Referral Source: MD Referral Referral Reason: Medication management.   Incoming call from sister in law. She is able to verify HIPAA.  Discussed reason for referral.  She states she has tried everything to get patient to remember to take medication but patient is mentally retarded and gets agitated about medications.  She states the only other thing she has thought about was placement but did not say they were ready for that.     Social: Patient lives with brother and sister-in-law.  Patient is mentally retarded.  Patient needs reminders for medications and care.     Conditions: Patient has hypertension and asthma.  Patient non-compliant with medications per sister-in-law.     Medications: Patient not taking medication as ordered. Sister-in-law  has tried everything.   Sister-in-law has been active with [facility] pharmacist and no solutions to patient medication management concerns.    Appointments: Patient saw physician last week by himself per sister-in-law.  She states that patient not good on relaying information such as new prescriptions and messages from the doctor.  Sister-in-law unable to go to every appointment due to her job.      Consent: Discussed [facility] Care management services.  Sister-in-law declined services at this time as she has already worked with [place] about medication management/compliance.  Plan:RN CM will close case at this time.   Jone Baseman, RN, MSN [facility] Care Management Care Management Coordinator Direct Line [phone] Toll Free: [phone]  Fax: [phone]

## 2017-09-16 NOTE — Patient Outreach (Signed)
[place] [facility]) Care Management  09/16/2017  Roberto Horne 1960-09-11 [id number]   Return call to [facility].  No answer.  HIPAA compliant voice message left.  Plan: RN CM will wait return phone call.  If no return will close case.   Jone Baseman, RN, MSN [facility] Care Management Care Management Coordinator Direct Line [phone] Toll Free: [phone]  Fax: [phone]

## 2017-09-16 NOTE — Telephone Encounter (Signed)
This encounter was created in error - please disregard.

## 2017-09-28 ENCOUNTER — Emergency Department (HOSPITAL_COMMUNITY): Payer: Medicare Other

## 2017-09-28 ENCOUNTER — Other Ambulatory Visit: Payer: Self-pay

## 2017-09-28 ENCOUNTER — Encounter (HOSPITAL_COMMUNITY): Payer: Self-pay | Admitting: *Deleted

## 2017-09-28 ENCOUNTER — Inpatient Hospital Stay (HOSPITAL_COMMUNITY)
Admission: EM | Admit: 2017-09-28 | Discharge: 2017-10-01 | DRG: 194 | Disposition: A | Discharge status: Home / Self Care | Payer: Medicare Other | Attending: Family Medicine | Admitting: Family Medicine

## 2017-09-28 DIAGNOSIS — J4530 Mild persistent asthma, uncomplicated: Secondary | ICD-10-CM | POA: Diagnosis not present

## 2017-09-28 DIAGNOSIS — J4541 Moderate persistent asthma with (acute) exacerbation: Secondary | ICD-10-CM | POA: Diagnosis present

## 2017-09-28 DIAGNOSIS — G4733 Obstructive sleep apnea (adult) (pediatric): Secondary | ICD-10-CM | POA: Diagnosis not present

## 2017-09-28 DIAGNOSIS — Z833 Family history of diabetes mellitus: Secondary | ICD-10-CM

## 2017-09-28 DIAGNOSIS — J111 Influenza due to unidentified influenza virus with other respiratory manifestations: Secondary | ICD-10-CM | POA: Diagnosis not present

## 2017-09-28 DIAGNOSIS — Z8349 Family history of other endocrine, nutritional and metabolic diseases: Secondary | ICD-10-CM

## 2017-09-28 DIAGNOSIS — M1A9XX0 Chronic gout, unspecified, without tophus (tophi): Secondary | ICD-10-CM | POA: Diagnosis present

## 2017-09-28 DIAGNOSIS — Z800 Family history of malignant neoplasm of digestive organs: Secondary | ICD-10-CM

## 2017-09-28 DIAGNOSIS — Z9981 Dependence on supplemental oxygen: Secondary | ICD-10-CM | POA: Diagnosis not present

## 2017-09-28 DIAGNOSIS — Z6835 Body mass index (BMI) 35.0-35.9, adult: Secondary | ICD-10-CM | POA: Diagnosis not present

## 2017-09-28 DIAGNOSIS — I10 Essential (primary) hypertension: Secondary | ICD-10-CM | POA: Diagnosis present

## 2017-09-28 DIAGNOSIS — J9611 Chronic respiratory failure with hypoxia: Secondary | ICD-10-CM | POA: Diagnosis present

## 2017-09-28 DIAGNOSIS — Z8701 Personal history of pneumonia (recurrent): Secondary | ICD-10-CM

## 2017-09-28 DIAGNOSIS — K219 Gastro-esophageal reflux disease without esophagitis: Secondary | ICD-10-CM | POA: Diagnosis present

## 2017-09-28 DIAGNOSIS — Z825 Family history of asthma and other chronic lower respiratory diseases: Secondary | ICD-10-CM | POA: Diagnosis not present

## 2017-09-28 DIAGNOSIS — Z7951 Long term (current) use of inhaled steroids: Secondary | ICD-10-CM

## 2017-09-28 DIAGNOSIS — J181 Lobar pneumonia, unspecified organism: Secondary | ICD-10-CM | POA: Diagnosis not present

## 2017-09-28 DIAGNOSIS — Z8249 Family history of ischemic heart disease and other diseases of the circulatory system: Secondary | ICD-10-CM | POA: Diagnosis not present

## 2017-09-28 DIAGNOSIS — J1108 Influenza due to unidentified influenza virus with specified pneumonia: Principal | ICD-10-CM | POA: Diagnosis present

## 2017-09-28 DIAGNOSIS — Z8261 Family history of arthritis: Secondary | ICD-10-CM | POA: Diagnosis not present

## 2017-09-28 DIAGNOSIS — E785 Hyperlipidemia, unspecified: Secondary | ICD-10-CM | POA: Diagnosis present

## 2017-09-28 DIAGNOSIS — J9621 Acute and chronic respiratory failure with hypoxia: Secondary | ICD-10-CM | POA: Diagnosis not present

## 2017-09-28 DIAGNOSIS — J189 Pneumonia, unspecified organism: Secondary | ICD-10-CM | POA: Diagnosis not present

## 2017-09-28 DIAGNOSIS — Z841 Family history of disorders of kidney and ureter: Secondary | ICD-10-CM | POA: Diagnosis not present

## 2017-09-28 DIAGNOSIS — R0602 Shortness of breath: Secondary | ICD-10-CM | POA: Diagnosis not present

## 2017-09-28 LAB — CBC
HCT: 42.1 % (ref 39.0–52.0)
Hemoglobin: 13.5 g/dL (ref 13.0–17.0)
MCH: 24.9 pg — ABNORMAL LOW (ref 26.0–34.0)
MCHC: 32.1 g/dL (ref 30.0–36.0)
MCV: 77.5 fL — AB (ref 78.0–100.0)
PLATELETS: 129 10*3/uL — AB (ref 150–400)
RBC: 5.43 MIL/uL (ref 4.22–5.81)
RDW: 15.4 % (ref 11.5–15.5)
WBC: 5.2 10*3/uL (ref 4.0–10.5)

## 2017-09-28 LAB — BRAIN NATRIURETIC PEPTIDE: B Natriuretic Peptide: 129.7 pg/mL — ABNORMAL HIGH (ref 0.0–100.0)

## 2017-09-28 LAB — BASIC METABOLIC PANEL
Anion gap: 11 (ref 5–15)
BUN: 11 mg/dL (ref 6–20)
CALCIUM: 8.6 mg/dL — AB (ref 8.9–10.3)
CHLORIDE: 103 mmol/L (ref 101–111)
CO2: 25 mmol/L (ref 22–32)
CREATININE: 1.03 mg/dL (ref 0.61–1.24)
Glucose, Bld: 94 mg/dL (ref 65–99)
Potassium: 3.6 mmol/L (ref 3.5–5.1)
SODIUM: 139 mmol/L (ref 135–145)

## 2017-09-28 LAB — I-STAT CG4 LACTIC ACID, ED: LACTIC ACID, VENOUS: 0.85 mmol/L (ref 0.5–1.9)

## 2017-09-28 LAB — TROPONIN I

## 2017-09-28 MED ORDER — IPRATROPIUM-ALBUTEROL 0.5-2.5 (3) MG/3ML IN SOLN
3.0000 mL | Freq: Once | RESPIRATORY_TRACT | Status: AC
  Administered 2017-09-28: 3 mL via RESPIRATORY_TRACT
  Filled 2017-09-28: qty 3

## 2017-09-28 MED ORDER — PANTOPRAZOLE SODIUM 40 MG PO TBEC
40.0000 mg | DELAYED_RELEASE_TABLET | Freq: Every day | ORAL | Status: DC
  Administered 2017-09-29 – 2017-10-01 (×3): 40 mg via ORAL
  Filled 2017-09-28 (×3): qty 1

## 2017-09-28 MED ORDER — FLUTICASONE PROPIONATE 50 MCG/ACT NA SUSP
1.0000 | Freq: Every day | NASAL | Status: DC
  Administered 2017-09-29 – 2017-10-01 (×3): 1 via NASAL
  Filled 2017-09-28: qty 16

## 2017-09-28 MED ORDER — AZITHROMYCIN 500 MG IV SOLR
500.0000 mg | INTRAVENOUS | Status: DC
  Administered 2017-09-29 – 2017-09-30 (×2): 500 mg via INTRAVENOUS
  Filled 2017-09-28 (×3): qty 500

## 2017-09-28 MED ORDER — IPRATROPIUM BROMIDE 0.02 % IN SOLN
0.5000 mg | Freq: Once | RESPIRATORY_TRACT | Status: AC
  Administered 2017-09-28: 0.5 mg via RESPIRATORY_TRACT
  Filled 2017-09-28: qty 2.5

## 2017-09-28 MED ORDER — FLUTICASONE FUROATE-VILANTEROL 200-25 MCG/INH IN AEPB
1.0000 | INHALATION_SPRAY | Freq: Every day | RESPIRATORY_TRACT | Status: DC
  Administered 2017-09-29 – 2017-10-01 (×3): 1 via RESPIRATORY_TRACT
  Filled 2017-09-28: qty 28

## 2017-09-28 MED ORDER — SODIUM CHLORIDE 0.9 % IV SOLN
1.0000 g | INTRAVENOUS | Status: DC
  Administered 2017-09-29 – 2017-09-30 (×2): 1 g via INTRAVENOUS
  Filled 2017-09-28 (×3): qty 10

## 2017-09-28 MED ORDER — IPRATROPIUM-ALBUTEROL 0.5-2.5 (3) MG/3ML IN SOLN: 3.0000 mL | Freq: Four times a day (QID) | RESPIRATORY_TRACT | Status: DC | PRN

## 2017-09-28 MED ORDER — AZITHROMYCIN 500 MG IV SOLR
500.0000 mg | Freq: Once | INTRAVENOUS | Status: AC
  Administered 2017-09-28: 500 mg via INTRAVENOUS
  Filled 2017-09-28: qty 500

## 2017-09-28 MED ORDER — SODIUM CHLORIDE 0.9 % IV SOLN
1.0000 g | Freq: Once | INTRAVENOUS | Status: AC
  Administered 2017-09-28: 1 g via INTRAVENOUS
  Filled 2017-09-28: qty 10

## 2017-09-28 MED ORDER — IPRATROPIUM-ALBUTEROL 0.5-2.5 (3) MG/3ML IN SOLN
3.0000 mL | Freq: Three times a day (TID) | RESPIRATORY_TRACT | Status: DC
  Administered 2017-09-29 – 2017-10-01 (×8): 3 mL via RESPIRATORY_TRACT
  Filled 2017-09-28 (×8): qty 3

## 2017-09-28 MED ORDER — COLCHICINE 0.6 MG PO TABS: 0.6000 mg | ORAL_TABLET | Freq: Every day | ORAL | Status: DC | PRN

## 2017-09-28 MED ORDER — LORATADINE 10 MG PO TABS: 10.0000 mg | ORAL_TABLET | Freq: Every day | ORAL | Status: DC | PRN

## 2017-09-28 MED ORDER — ENOXAPARIN SODIUM 40 MG/0.4ML ~~LOC~~ SOLN
40.0000 mg | SUBCUTANEOUS | Status: DC
  Administered 2017-09-29 – 2017-10-01 (×3): 40 mg via SUBCUTANEOUS
  Filled 2017-09-28 (×3): qty 0.4

## 2017-09-28 MED ORDER — DM-GUAIFENESIN ER 30-600 MG PO TB12
1.0000 | ORAL_TABLET | Freq: Two times a day (BID) | ORAL | Status: DC | PRN
  Administered 2017-09-29 – 2017-09-30 (×2): 1 via ORAL
  Filled 2017-09-28 (×2): qty 1

## 2017-09-28 MED ORDER — ACETAMINOPHEN 500 MG PO TABS
1000.0000 mg | ORAL_TABLET | Freq: Once | ORAL | Status: AC
  Administered 2017-09-28: 1000 mg via ORAL
  Filled 2017-09-28: qty 2

## 2017-09-28 MED ORDER — METOPROLOL SUCCINATE ER 25 MG PO TB24
25.0000 mg | ORAL_TABLET | Freq: Every day | ORAL | Status: DC
  Administered 2017-09-29 – 2017-09-30 (×2): 25 mg via ORAL
  Filled 2017-09-28 (×3): qty 1

## 2017-09-28 MED ORDER — METHYLPREDNISOLONE SODIUM SUCC 125 MG IJ SOLR
125.0000 mg | Freq: Once | INTRAMUSCULAR | Status: AC
  Administered 2017-09-28: 125 mg via INTRAVENOUS
  Filled 2017-09-28: qty 2

## 2017-09-28 MED ORDER — SODIUM CHLORIDE 0.9 % IV BOLUS
1000.0000 mL | Freq: Once | INTRAVENOUS | Status: AC
  Administered 2017-09-28: 1000 mL via INTRAVENOUS

## 2017-09-28 MED ORDER — ALBUTEROL (5 MG/ML) CONTINUOUS INHALATION SOLN
10.0000 mg/h | INHALATION_SOLUTION | RESPIRATORY_TRACT | Status: DC
Start: 2017-09-28 — End: 2017-09-29
  Administered 2017-09-28: 10 mg/h via RESPIRATORY_TRACT
  Filled 2017-09-28: qty 20

## 2017-09-28 MED ORDER — SODIUM CHLORIDE 0.9 % IV SOLN
INTRAVENOUS | Status: DC
  Administered 2017-09-28: 23:00:00 via INTRAVENOUS

## 2017-09-28 NOTE — ED Provider Notes (Signed)
[place] EMERGENCY DEPARTMENT Provider Note  CSN: [id number] Arrival date & time: 09/28/17 1638  Chief Complaint(s) Shortness of Breath  HPI Roberto Horne is a 57 y.o. male with a history of asthma who presents to the emergency department with 1 week of nasal congestion, productive cough of green sputum that has gradually worsened since onset.  Patient endorses subjective fevers and chills.  Also endorses mild shortness of breath exacerbated with coughing.  Denies any chest pain.  No associated nausea or vomiting.  No abdominal pain no urinary symptoms.  Patient reports that his brother and sister-in-law have had coughing.  Denies any other known sick contacts.  Patient does report prior history of pneumonia.  Denies any history of COPD or tobacco use.  Denies any history of cardiac history.  HPI  Past Medical History Past Medical History:  Diagnosis Date  . Allergy   . Arthritis   . Asthma   . Depression   . GERD (gastroesophageal reflux disease)   . Gout   . Hyperlipemia   . Hypertension   . Mental impairment   . Pneumonia   . Sleep apnea    Patient Active Problem List   Diagnosis Date Noted  . Mental retardation 12/03/2016  . Dental caries 12/03/2016  . Obesity 10/01/2016  . Left foot pain 08/30/2015  . Acute upper respiratory infection 08/30/2015  . Nasal polyps 07/21/2015  . Contusion, arm, upper 03/01/2015  . Hand contusion 03/01/2015  . Obstructive sleep apnea 12/16/2014  . Seasonal and perennial allergic rhinitis 09/21/2014  . Medicare annual wellness visit, subsequent 09/07/2014  . CAP (community acquired pneumonia) 07/07/2014  . Effusion of right knee 07/07/2014  . Hypertension 07/07/2014  . Abdominal pain 07/07/2014  . Pneumonia 07/07/2014  . Asthma, mild persistent 12/15/2012  . Gout, unspecified 12/15/2012  . Esophageal reflux 12/15/2012  . HTN (hypertension) 11/06/2012  . Hyperlipidemia 11/06/2012   Home Medication(s) Prior to  Admission medications   Medication Sig Start Date End Date Taking? Authorizing Provider  albuterol (PROVENTIL HFA;VENTOLIN HFA) 108 (90 Base) MCG/ACT inhaler Inhale 2 puffs into the lungs every 4 (four) hours as needed for wheezing or shortness of breath. 08/21/17   Baird Lyons D, MD  baclofen (LIORESAL) 10 MG tablet TAKE 1 TABLET BY MOUTH EVERY 8 HOURS AS NEEDED FOR SPASM 02/01/17   [provider]  budesonide-formoterol (SYMBICORT) 160-4.5 MCG/ACT inhaler Inhale 2 puffs into the lungs 2 (two) times daily. 08/29/17   Magdalen Spatz, NP  budesonide-formoterol (SYMBICORT) 160-4.5 MCG/ACT inhaler Inhale 2 puffs into the lungs 2 (two) times daily. 08/29/17   Magdalen Spatz, NP  colchicine 0.6 MG tablet Take 2 tablets at the onset of a flare and 1 tablet 1 hour later. Repeat in 72 hours if needed. 03/12/17   Golden Circle, FNP  dextromethorphan-guaiFENesin (MUCINEX DM) 30-600 MG 12hr tablet Take 1 tablet 2 (two) times daily as needed by mouth for cough. 05/08/17   Nche, Charlene Brooke, NP  doxycycline (VIBRA-TABS) 100 MG tablet Take 1 tablet (100 mg total) by mouth 2 (two) times daily. 08/29/17   Magdalen Spatz, NP  fluticasone (FLONASE) 50 MCG/ACT nasal spray Place 1 spray into both nostrils daily. 08/29/17   Magdalen Spatz, NP  loratadine (CLARITIN) 10 MG tablet Take 10 mg by mouth daily as needed for allergies.     [provider]  metoprolol succinate (TOPROL-XL) 25 MG 24 hr tablet Take 1 tablet (25 mg total) by mouth daily. 08/29/17  Magdalen Spatz, NP  omeprazole (PRILOSEC OTC) 20 MG tablet Take 20 mg by mouth daily.    [provider]  Spacer/Aero-Holding Chambers (AEROCHAMBER MV) inhaler Use as instructed 08/21/17   Deneise Lever, MD                                                                                                                                    Past Surgical History Past Surgical History:  Procedure Laterality Date  . BRAIN SURGERY    . FUNCTIONAL  ENDOSCOPIC SINUS SURGERY  10/14/2015  . SINUS ENDO W/FUSION Bilateral 10/14/2015   Procedure: ENDOSCOPIC SINUS SURGERY WITH NAVIGATION;  Surgeon: Melida Quitter, MD;  [facility] OR;  Service: ENT;  Laterality: Bilateral;   Family History Family History  Problem Relation Age of Onset  . Arthritis Mother   . Hyperlipidemia Mother   . Hypertension Mother   . Diabetes Mother   . Stomach cancer Mother        possible, not 100 % sure  . Kidney disease Mother   . Arthritis Father   . Hypertension Father   . Diabetes Father   . Asthma Maternal Grandmother   . Rectal cancer Neg Hx   . Colon cancer Neg Hx     Social History Social History   Tobacco Use  . Smoking status: Never Smoker  . Smokeless tobacco: Never Used  Substance Use Topics  . Alcohol use: No  . Drug use: No   Allergies Ibuprofen  Review of Systems Review of Systems All other systems are reviewed and are negative for acute change except as noted in the HPI  Physical Exam Vital Signs  I have reviewed the triage vital signs BP 130/86 (BP Location: Right Arm)   Pulse (!) 101   Temp 99.6 F (37.6 C) (Oral)   Resp (!) 25   Ht 6\' 3"  (1.905 m)   Wt 127 kg (280 lb)   SpO2 93%   BMI 35.00 kg/m   Physical Exam  Constitutional: He is oriented to person, place, and time. He appears well-developed and well-nourished. No distress.  HENT:  Head: Normocephalic and atraumatic.  Nose: Nose normal.  Eyes: Pupils are equal, round, and reactive to light. Conjunctivae and EOM are normal. Right eye exhibits no discharge. Left eye exhibits no discharge. No scleral icterus.  Neck: Normal range of motion. Neck supple.  Cardiovascular: Normal rate and regular rhythm. Exam reveals no gallop and no friction rub.  No murmur heard. Pulmonary/Chest: Effort normal. No stridor. Tachypnea noted. No respiratory distress. He has wheezes (faint expiratory wheezing with prolonged exp phase). He has rales (course rales throughout.).    Abdominal: Soft. He exhibits no distension. There is no tenderness.  Musculoskeletal: He exhibits no edema or tenderness.  Neurological: He is alert and oriented to person, place, and time.  Skin: Skin is warm and dry. No rash noted. He is not diaphoretic.  No erythema.  Psychiatric: He has a normal mood and affect.  Vitals reviewed.   ED Results and Treatments Labs (all labs ordered are listed, but only abnormal results are displayed) Labs Reviewed  BASIC METABOLIC PANEL - Abnormal; Notable for the following components:      Result Value   Calcium 8.6 (*)    All other components within normal limits  CBC - Abnormal; Notable for the following components:   MCV 77.5 (*)    MCH 24.9 (*)    Platelets 129 (*)    All other components within normal limits  BRAIN NATRIURETIC PEPTIDE - Abnormal; Notable for the following components:   B Natriuretic Peptide 129.7 (*)    All other components within normal limits  CULTURE, BLOOD (ROUTINE X 2)  CULTURE, BLOOD (ROUTINE X 2)  TROPONIN I  I-STAT CG4 LACTIC ACID, ED  I-STAT CG4 LACTIC ACID, ED                                                                                                                         EKG  EKG Interpretation  Date/Time:  Saturday September 28 2017 16:42:30 EDT Ventricular Rate:  109 PR Interval:    QRS Duration: 72 QT Interval:  354 QTC Calculation: 476 R Axis:   100 Text Interpretation:  Sinus tachycardia with Premature supraventricular complexes Rightward axis Borderline ECG Otherwise no significant change Confirmed by Addison Lank [phone]) on 09/28/2017 7:53:04 PM      Radiology Dg Chest Portable 1 View  Result Date: 09/28/2017 CLINICAL DATA:  Shortness of breath and tachycardia for 1 week. EXAM: PORTABLE CHEST 1 VIEW COMPARISON:  May 27, 2017 FINDINGS: The heart size and mediastinal contours are stable. Patchy consolidation of right lung base is identified. There is no pulmonary edema or pleural effusion.  The visualized skeletal structures are unremarkable. IMPRESSION: Patchy consolidation of right lung base suspicious for pneumonia. Electronically Signed   By: Abelardo Diesel M.D.   On: 09/28/2017 17:44   Pertinent labs & imaging results that were available during my care of the patient were reviewed by me and considered in my medical decision making (see chart for details).  Medications Ordered in ED Medications  azithromycin (ZITHROMAX) 500 mg in sodium chloride 0.9 % 250 mL IVPB (has no administration in time range)  albuterol (PROVENTIL,VENTOLIN) solution continuous neb (10 mg/hr Nebulization New Bag/Given 09/28/17 2012)  methylPREDNISolone sodium succinate (SOLU-MEDROL) 125 mg/2 mL injection 125 mg (has no administration in time range)  sodium chloride 0.9 % bolus 1,000 mL (has no administration in time range)  ipratropium-albuterol (DUONEB) 0.5-2.5 (3) MG/3ML nebulizer solution 3 mL (3 mLs Nebulization Given 09/28/17 1752)  cefTRIAXone (ROCEPHIN) 1 g in sodium chloride 0.9 % 100 mL IVPB (1 g Intravenous New Bag/Given 09/28/17 1910)  ipratropium-albuterol (DUONEB) 0.5-2.5 (3) MG/3ML nebulizer solution 3 mL (3 mLs Nebulization Given 09/28/17 1909)  acetaminophen (TYLENOL) tablet 1,000 mg (1,000 mg Oral Given 09/28/17 1908)  ipratropium (ATROVENT) nebulizer solution 0.5  mg (0.5 mg Nebulization Given 09/28/17 2012)                                                                                                                                    Procedures Procedures CRITICAL CARE Performed by: Grayce Sessions Sachit Gilman Total critical care time: 35 minutes Critical care time was exclusive of separately billable procedures and treating other patients. Critical care was necessary to treat or prevent imminent or life-threatening deterioration. Critical care was time spent personally by me on the following activities: development of treatment plan with patient and/or surrogate as well as nursing, discussions with  consultants, evaluation of patient's response to treatment, examination of patient, obtaining history from patient or surrogate, ordering and performing treatments and interventions, ordering and review of laboratory studies, ordering and review of radiographic studies, pulse oximetry and re-evaluation of patient's condition.   (including critical care time)  Medical Decision Making / ED Course I have reviewed the nursing notes for this encounter and the patient's prior records (if available in EHR or on provided paperwork).    Workup consistent with right lower lobe pneumonia with superimposed asthma exacerbation.  Patient was given several breathing treatments with mild improvement.  He was given empiric antibiotics to cover for community-acquired pneumonia.  He did not meet criteria for severe sepsis initially.  However on reassessment, patient was noted to be mildly hypoxic with saturations in the low 90s and improved with 2 L nasal cannula.  Code sepsis was initiated at that time.  He otherwise is hemodynamically stable and does not require 30 cc/kg of IV fluids.  Case was discussed with hospitalist service for admission and continued management.  Final Clinical Impression(s) / ED Diagnoses Final diagnoses:  Community acquired pneumonia of right lower lobe of lung ([place])  Moderate persistent asthma with exacerbation      This chart was dictated using voice recognition software.  Despite best efforts to proofread,  errors can occur which can change the documentation meaning.   Fatima Blank, MD 09/28/17 2013

## 2017-09-28 NOTE — ED Triage Notes (Signed)
The pt is c/o sob since this amm hx of asthma audible wheezes  He also has a rapid irregular heart rate  He cannot tell me if he has a history of the same

## 2017-09-28 NOTE — H&P (Signed)
History and Physical    Roberto Horne KXF:[id number] DOB: [birth year] DOA: 09/28/2017  Referring MD/NP/PA: Dr Addison Lank  PCP: Golden Circle, FNP   Outpatient Specialists: None   Patient coming from: Home  Chief Complaint: shortness of breath  HPI: Roberto Horne is a 57 y.o. male with medical history significant of asthma and hypertension who presented to the ER with progressive shortness of breath and cough. Symptoms started about a week ago with nasal congestion then gradually productive cough with green sputum. He has some fever and chills. Denied any significant wheezing. Patient's brother was also sick and he believes he got it from his brother. Patient has no history of tobacco use. He has control asthma with no frequent exacerbation.  ED Course: ER workup included chest x-ray that showed right lower lobe infiltrate. Patient has mild wheezing. Oxygen sats in the low 90s on oxygen at 2 L/m. White count is 5.2 platelets 129. Lactic acid 0.85 BNP 129  Review of Systems: As per HPI otherwise 10 point review of systems negative.    Past Medical History:  Diagnosis Date  . Allergy   . Arthritis   . Asthma   . Depression   . GERD (gastroesophageal reflux disease)   . Gout   . Hyperlipemia   . Hypertension   . Mental impairment   . Pneumonia   . Sleep apnea     Past Surgical History:  Procedure Laterality Date  . BRAIN SURGERY    . FUNCTIONAL ENDOSCOPIC SINUS SURGERY  10/14/2015  . SINUS ENDO W/FUSION Bilateral 10/14/2015   Procedure: ENDOSCOPIC SINUS SURGERY WITH NAVIGATION;  Surgeon: Melida Quitter, MD;  Location: [facility];  Service: ENT;  Laterality: Bilateral;     reports that he has never smoked. He has never used smokeless tobacco. He reports that he does not drink alcohol or use drugs.  Allergies  Allergen Reactions  . Ibuprofen Itching    Family History  Problem Relation Age of Onset  . Arthritis Mother   . Hyperlipidemia Mother   . Hypertension Mother    . Diabetes Mother   . Stomach cancer Mother        possible, not 100 % sure  . Kidney disease Mother   . Arthritis Father   . Hypertension Father   . Diabetes Father   . Asthma Maternal Grandmother   . Rectal cancer Neg Hx   . Colon cancer Neg Hx      Prior to Admission medications   Medication Sig Start Date End Date Taking? Authorizing Provider  albuterol (PROVENTIL HFA;VENTOLIN HFA) 108 (90 Base) MCG/ACT inhaler Inhale 2 puffs into the lungs every 4 (four) hours as needed for wheezing or shortness of breath. 08/21/17   Baird Lyons D, MD  baclofen (LIORESAL) 10 MG tablet TAKE 1 TABLET BY MOUTH EVERY 8 HOURS AS NEEDED FOR SPASM 02/01/17   [provider]  budesonide-formoterol (SYMBICORT) 160-4.5 MCG/ACT inhaler Inhale 2 puffs into the lungs 2 (two) times daily. 08/29/17   Magdalen Spatz, NP  budesonide-formoterol (SYMBICORT) 160-4.5 MCG/ACT inhaler Inhale 2 puffs into the lungs 2 (two) times daily. 08/29/17   Magdalen Spatz, NP  colchicine 0.6 MG tablet Take 2 tablets at the onset of a flare and 1 tablet 1 hour later. Repeat in 72 hours if needed. 03/12/17   Golden Circle, FNP  dextromethorphan-guaiFENesin (MUCINEX DM) 30-600 MG 12hr tablet Take 1 tablet 2 (two) times daily as needed by mouth for cough. 05/08/17  Nche, Charlene Brooke, NP  doxycycline (VIBRA-TABS) 100 MG tablet Take 1 tablet (100 mg total) by mouth 2 (two) times daily. 08/29/17   Magdalen Spatz, NP  fluticasone (FLONASE) 50 MCG/ACT nasal spray Place 1 spray into both nostrils daily. 08/29/17   Magdalen Spatz, NP  loratadine (CLARITIN) 10 MG tablet Take 10 mg by mouth daily as needed for allergies.     [provider]  metoprolol succinate (TOPROL-XL) 25 MG 24 hr tablet Take 1 tablet (25 mg total) by mouth daily. 08/29/17   Magdalen Spatz, NP  omeprazole (PRILOSEC OTC) 20 MG tablet Take 20 mg by mouth daily.    [provider]  Spacer/Aero-Holding Chambers (AEROCHAMBER MV) inhaler Use as instructed  08/21/17   Baird Lyons D, MD    Physical Exam: Vitals:   09/28/17 1815 09/28/17 1845 09/28/17 1900 09/28/17 2000  BP:  (!) 140/92 (!) 154/93 131/77  Pulse: 84 71 (!) 54 80  Resp: (!) 22 (!) 27  15  Temp:      TempSrc:      SpO2: 99% 91% 92% 96%  Weight:      Height:          Constitutional: NAD, calm, comfortable Vitals:   09/28/17 1815 09/28/17 1845 09/28/17 1900 09/28/17 2000  BP:  (!) 140/92 (!) 154/93 131/77  Pulse: 84 71 (!) 54 80  Resp: (!) 22 (!) 27  15  Temp:      TempSrc:      SpO2: 99% 91% 92% 96%  Weight:      Height:       Eyes: PERRL, lids and conjunctivae normal ENMT: Mucous membranes are moist. Posterior pharynx clear of any exudate or lesions.Normal dentition.  Neck: normal, supple, no masses, no thyromegaly Respiratory: Fair AE bilaterally, mild wheezing, no crackles. Increased Respiratory drive Cardiovascular: Regular rate and rhythm, no murmurs / rubs / gallops. No extremity edema. 2+ pedal pulses. No carotid bruits.  Abdomen: no tenderness, no masses palpated. No hepatosplenomegaly. Bowel sounds positive.  Musculoskeletal: no clubbing / cyanosis. No joint deformity upper and lower extremities. Good ROM, no contractures. Normal muscle tone.  Skin: no rashes, lesions, ulcers. No induration Neurologic: CN 2-12 grossly intact. Sensation intact, DTR normal. Strength 5/5 in all 4.  Psychiatric: Normal judgment and insight. Alert and oriented x 3. Normal mood.   Labs on Admission: I have personally reviewed following labs and imaging studies  CBC: Recent Labs  Lab 09/28/17 1645  WBC 5.2  HGB 13.5  HCT 42.1  MCV 77.5*  PLT 628*   Basic Metabolic Panel: Recent Labs  Lab 09/28/17 1645  NA 139  K 3.6  CL 103  CO2 25  GLUCOSE 94  BUN 11  CREATININE 1.03  CALCIUM 8.6*   GFR: Estimated Creatinine Clearance: 113.6 mL/min (by C-G formula based on SCr of 1.03 mg/dL). Liver Function Tests: No results for input(s): AST, ALT, ALKPHOS, BILITOT,  PROT, ALBUMIN in the last 168 hours. No results for input(s): LIPASE, AMYLASE in the last 168 hours. No results for input(s): AMMONIA in the last 168 hours. Coagulation Profile: No results for input(s): INR, PROTIME in the last 168 hours. Cardiac Enzymes: Recent Labs  Lab 09/28/17 1645  TROPONINI <0.03   BNP (last 3 results) No results for input(s): PROBNP in the last 8760 hours. HbA1C: No results for input(s): HGBA1C in the last 72 hours. CBG: No results for input(s): GLUCAP in the last 168 hours. Lipid Profile: No results for  input(s): CHOL, HDL, LDLCALC, TRIG, CHOLHDL, LDLDIRECT in the last 72 hours. Thyroid Function Tests: No results for input(s): TSH, T4TOTAL, FREET4, T3FREE, THYROIDAB in the last 72 hours. Anemia Panel: No results for input(s): VITAMINB12, FOLATE, FERRITIN, TIBC, IRON, RETICCTPCT in the last 72 hours. Urine analysis:    Component Value Date/Time   COLORURINE YELLOW 10/08/2015 1918   APPEARANCEUR CLEAR 10/08/2015 1918   LABSPEC 1.023 10/08/2015 1918   PHURINE 5.5 10/08/2015 1918   GLUCOSEU NEGATIVE 10/08/2015 1918   HGBUR NEGATIVE 10/08/2015 1918   BILIRUBINUR NEGATIVE 10/08/2015 1918   KETONESUR NEGATIVE 10/08/2015 1918   PROTEINUR NEGATIVE 10/08/2015 1918   UROBILINOGEN 1.0 11/06/2012 2222   NITRITE NEGATIVE 10/08/2015 1918   LEUKOCYTESUR NEGATIVE 10/08/2015 1918   Sepsis Labs: @LABRCNTIP (procalcitonin:4,lacticidven:4) )No results found for this or any previous visit (from the past 240 hour(s)).   Radiological Exams on Admission: Dg Chest Portable 1 View  Result Date: 09/28/2017 CLINICAL DATA:  Shortness of breath and tachycardia for 1 week. EXAM: PORTABLE CHEST 1 VIEW COMPARISON:  May 27, 2017 FINDINGS: The heart size and mediastinal contours are stable. Patchy consolidation of right lung base is identified. There is no pulmonary edema or pleural effusion. The visualized skeletal structures are unremarkable. IMPRESSION: Patchy consolidation of  right lung base suspicious for pneumonia. Electronically Signed   By: Abelardo Diesel M.D.   On: 09/28/2017 17:44    EKG: Independently reviewed. Shows sinus tachycardia with a rate of 109 no specific ST changes  Assessment/Plan Principal Problem:   CAP (community acquired pneumonia) Active Problems:   Asthma, mild persistent   Hypertension   Pneumonia   Obstructive sleep apnea    #1 Community Acquired pneumonia: most likely after sick contact from his brother. He will be initiated on Rocephin and Zithromax max empirically. Check influenza screen, sputum cultures and blood cultures will be obtained. Admit patient for IV antibiotics.  #2 mild persistent asthma: Patient not actively wheezing at the moment. We will initiate oral prednisone. Monitor with nebulizer in the hospital.  #3 hypertension: Continue home regimen. Per pressure is controlled at the moment  #4 morbid obesity: Dietary counseling  #5 obstructive sleep apnea: CPAP at night.  DVT prophylaxis: Lovenox  Code Status: Full Family Communication: None at bedside  Disposition Plan: Home Consults called: None  Admission status: Inpatient  Severity of Illness: The appropriate patient status for this patient is INPATIENT. Inpatient status is judged to be reasonable and necessary in order to provide the required intensity of service to ensure the patient's safety. The patient's presenting symptoms, physical exam findings, and initial radiographic and laboratory data in the context of their chronic comorbidities is felt to place them at high risk for further clinical deterioration. Furthermore, it is not anticipated that the patient will be medically stable for discharge from the hospital within 2 midnights of admission. The following factors support the patient status of inpatient.   " The patient's presenting symptoms include Shortness of Breath. " The worrisome physical exam findings include Wheezing and cough. " The initial  radiographic and laboratory data are worrisome because of CXR showing RLL infiltrate. " The chronic co-morbidities include Asthma.   * I certify that at the point of admission it is my clinical judgment that the patient will require inpatient hospital care spanning beyond 2 midnights from the point of admission due to high intensity of service, high risk for further deterioration and high frequency of surveillance required.Barbette Merino MD [facility] Pager [phone]  [phone]  If 7PM-7AM, please contact night-coverage [email or web address] Password [facility]  09/28/2017, 8:33 PM

## 2017-09-28 NOTE — ED Notes (Signed)
Pt states improved sob after breathing tx, though breathing remains labored and he continues to wheeze.

## 2017-09-28 NOTE — ED Notes (Signed)
Jessica Seidman (Brother) [phone]

## 2017-09-29 ENCOUNTER — Other Ambulatory Visit: Payer: Self-pay

## 2017-09-29 DIAGNOSIS — G4733 Obstructive sleep apnea (adult) (pediatric): Secondary | ICD-10-CM

## 2017-09-29 DIAGNOSIS — J4541 Moderate persistent asthma with (acute) exacerbation: Secondary | ICD-10-CM

## 2017-09-29 DIAGNOSIS — I10 Essential (primary) hypertension: Secondary | ICD-10-CM

## 2017-09-29 DIAGNOSIS — J181 Lobar pneumonia, unspecified organism: Secondary | ICD-10-CM

## 2017-09-29 LAB — CBC WITH DIFFERENTIAL/PLATELET
BASOS ABS: 0 10*3/uL (ref 0.0–0.1)
Basophils Relative: 1 %
EOS ABS: 0 10*3/uL (ref 0.0–0.7)
Eosinophils Relative: 0 %
HEMATOCRIT: 42.5 % (ref 39.0–52.0)
Hemoglobin: 13.3 g/dL (ref 13.0–17.0)
LYMPHS ABS: 0.8 10*3/uL (ref 0.7–4.0)
Lymphocytes Relative: 42 %
MCH: 24.1 pg — ABNORMAL LOW (ref 26.0–34.0)
MCHC: 31.3 g/dL (ref 30.0–36.0)
MCV: 77 fL — ABNORMAL LOW (ref 78.0–100.0)
MONO ABS: 0.1 10*3/uL (ref 0.1–1.0)
MONOS PCT: 4 %
NEUTROS ABS: 1.1 10*3/uL — AB (ref 1.7–7.7)
Neutrophils Relative %: 53 %
PLATELETS: 127 10*3/uL — AB (ref 150–400)
RBC: 5.52 MIL/uL (ref 4.22–5.81)
RDW: 15.2 % (ref 11.5–15.5)
WBC: 2 10*3/uL — AB (ref 4.0–10.5)

## 2017-09-29 LAB — COMPREHENSIVE METABOLIC PANEL
ALK PHOS: 44 U/L (ref 38–126)
ALT: 17 U/L (ref 17–63)
AST: 28 U/L (ref 15–41)
Albumin: 2.9 g/dL — ABNORMAL LOW (ref 3.5–5.0)
Anion gap: 12 (ref 5–15)
BILIRUBIN TOTAL: 0.5 mg/dL (ref 0.3–1.2)
BUN: 6 mg/dL (ref 6–20)
CALCIUM: 8.2 mg/dL — AB (ref 8.9–10.3)
CO2: 21 mmol/L — ABNORMAL LOW (ref 22–32)
Chloride: 103 mmol/L (ref 101–111)
Creatinine, Ser: 0.81 mg/dL (ref 0.61–1.24)
GFR calc Af Amer: >60 mL/min (ref >60)
Glucose, Bld: 216 mg/dL — ABNORMAL HIGH (ref 65–99)
Potassium: 3.9 mmol/L (ref 3.5–5.1)
Sodium: 136 mmol/L (ref 135–145)
TOTAL PROTEIN: 6.9 g/dL (ref 6.5–8.1)

## 2017-09-29 LAB — INFLUENZA PANEL BY PCR (TYPE A & B)
INFLAPCR: POSITIVE — AB
INFLBPCR: NEGATIVE

## 2017-09-29 MED ORDER — ALBUTEROL SULFATE (2.5 MG/3ML) 0.083% IN NEBU: 2.5000 mg | INHALATION_SOLUTION | RESPIRATORY_TRACT | Status: DC | PRN

## 2017-09-29 MED ORDER — OSELTAMIVIR PHOSPHATE 75 MG PO CAPS
75.0000 mg | ORAL_CAPSULE | Freq: Two times a day (BID) | ORAL | Status: DC
  Administered 2017-09-29 – 2017-10-01 (×5): 75 mg via ORAL
  Filled 2017-09-29 (×5): qty 1

## 2017-09-29 MED ORDER — PREDNISONE 20 MG PO TABS
40.0000 mg | ORAL_TABLET | Freq: Every day | ORAL | Status: DC
  Administered 2017-09-30 – 2017-10-01 (×2): 40 mg via ORAL
  Filled 2017-09-29 (×2): qty 2

## 2017-09-29 NOTE — Progress Notes (Signed)
PROGRESS NOTE  Roberto Horne  TGG:[id number] DOB: Oct 26, 1960 DOA: 09/28/2017 PCP: [facility], FNP   Brief Narrative: Roberto Horne is a 57 y.o. male with medical history significant of asthma on chronic oxygen and hypertension who presented to the ER with progressive shortness of breath and cough. Symptoms started about a week ago with nasal congestion then gradually productive cough with green sputum. He has some fever and chills. Denied any significant wheezing. Patient's brother was also sick and he believes he got it from his brother. Patient has no history of tobacco use. He has control asthma with no frequent exacerbation.  Pt found to be wheezing with CXR showing RLL infiltrate and flu swab positive. Antimicrobials were started and he was admitted.   Assessment & Plan: Principal Problem:   CAP (community acquired pneumonia) Active Problems:   Asthma, mild persistent   Hypertension   Pneumonia   Obstructive sleep apnea  Asthma exacerbation on moderate persistent asthma due to CAP and influenza: - Start steroid burst and inhaled bronchodilators scheduled and prn.  - Needs follow up with pulmonology (established w/[name]).  - Start dulera for symbicort - Continue ceftriaxone, azithromycin, plan to narrow as able, and tamiflu x5 days.  - Monitor blood and sputum culture - Continue flonase, claritin - Of note, pt states he's on 2LPM O2 for 6 months though there is no mention of this in the record despite several office visits.  HTN: Stable.  - Continue metoprolol  OSA:  - CPAP qHS  Morbid obesity: BMI 35.  - Weight loss counseling provided  GERD: Stable - Continue PPI  Gout: Chronic.  - Continue colchicine prn  DVT prophylaxis: Lovenox Code Status: Full Family Communication: None at bedside Disposition Plan: Home when improved.  Consultants:   None  Procedures:   None  Antimicrobials:  Ceftriaxone, azithromycin 4/6 >>   Tamiflu 4/7 - 4/11    Subjective: Still with lots of wheezing and difficulty catching his breath. No chest pain, palpitations.   Objective: Vitals:   09/29/17 0825 09/29/17 1228 09/29/17 1349 09/29/17 1551  BP:  138/88  127/82  Pulse:  80  69  Resp:  17  16  Temp:  97.9 F (36.6 C)  97.7 F (36.5 C)  TempSrc:  Oral  Oral  SpO2: 98% 97% 97% 100%  Weight:      Height:        Intake/Output Summary (Last 24 hours) at 09/29/2017 1604 Last data filed at 09/29/2017 1551 Gross per 24 hour  Intake 625 ml  Output 200 ml  Net 425 ml   Filed Weights   09/28/17 1652  Weight: 127 kg (280 lb)    Gen: 57 y.o. male in no distress Pulm: Non-labored breathing 2LPM. Diffuse expiratory wheezing with prolonged expiratory phase. CV: Regular rate and rhythm. No murmur, rub, or gallop. No JVD, no pedal edema. GI: Abdomen soft, non-tender, non-distended, with normoactive bowel sounds. No organomegaly or masses felt. Ext: Warm, no deformities Skin: No rashes, lesions no ulcers Neuro: Alert and oriented. Speech impediment noted. No focal neurological deficits. Psych: Judgement and insight appear normal. Cognitive ability may be subnormal. Mood & affect appropriate.   Data Reviewed: I have personally reviewed following labs and imaging studies  CBC: Recent Labs  Lab 09/28/17 1645 09/29/17 0749  WBC 5.2 2.0*  NEUTROABS  --  1.1*  HGB 13.5 13.3  HCT 42.1 42.5  MCV 77.5* 77.0*  PLT 129* 703*   Basic Metabolic Panel: Recent Labs  Lab  09/28/17 1645 09/29/17 0749  NA 139 136  K 3.6 3.9  CL 103 103  CO2 25 21*  GLUCOSE 94 216*  BUN 11 6  CREATININE 1.03 0.81  CALCIUM 8.6* 8.2*   GFR: Estimated Creatinine Clearance: 144.5 mL/min (by C-G formula based on SCr of 0.81 mg/dL). Liver Function Tests: Recent Labs  Lab 09/29/17 0749  AST 28  ALT 17  ALKPHOS 44  BILITOT 0.5  PROT 6.9  ALBUMIN 2.9*   No results for input(s): LIPASE, AMYLASE in the last 168 hours. No results for input(s): AMMONIA in the  last 168 hours. Coagulation Profile: No results for input(s): INR, PROTIME in the last 168 hours. Cardiac Enzymes: Recent Labs  Lab 09/28/17 1645  TROPONINI <0.03   BNP (last 3 results) No results for input(s): PROBNP in the last 8760 hours. HbA1C: No results for input(s): HGBA1C in the last 72 hours. CBG: No results for input(s): GLUCAP in the last 168 hours. Lipid Profile: No results for input(s): CHOL, HDL, LDLCALC, TRIG, CHOLHDL, LDLDIRECT in the last 72 hours. Thyroid Function Tests: No results for input(s): TSH, T4TOTAL, FREET4, T3FREE, THYROIDAB in the last 72 hours. Anemia Panel: No results for input(s): VITAMINB12, FOLATE, FERRITIN, TIBC, IRON, RETICCTPCT in the last 72 hours. Urine analysis:    Component Value Date/Time   COLORURINE YELLOW 10/08/2015 1918   APPEARANCEUR CLEAR 10/08/2015 1918   LABSPEC 1.023 10/08/2015 1918   PHURINE 5.5 10/08/2015 1918   GLUCOSEU NEGATIVE 10/08/2015 1918   HGBUR NEGATIVE 10/08/2015 1918   BILIRUBINUR NEGATIVE 10/08/2015 1918   KETONESUR NEGATIVE 10/08/2015 1918   PROTEINUR NEGATIVE 10/08/2015 1918   UROBILINOGEN 1.0 11/06/2012 2222   NITRITE NEGATIVE 10/08/2015 1918   LEUKOCYTESUR NEGATIVE 10/08/2015 1918   Recent Results (from the past 240 hour(s))  Blood culture (routine x 2)     Status: None (Preliminary result)   Collection Time: 09/28/17  6:14 PM  Result Value Ref Range Status   Specimen Description BLOOD BLOOD RIGHT FOREARM  Final   Special Requests   Final    BOTTLES DRAWN AEROBIC AND ANAEROBIC Blood Culture adequate volume   Culture   Final    NO GROWTH < 24 HOURS Performed at [facility], [place] [address], [place], [place] [address]    Report Status PENDING  Incomplete  Blood culture (routine x 2)     Status: None (Preliminary result)   Collection Time: 09/28/17  6:30 PM  Result Value Ref Range Status   Specimen Description BLOOD RIGHT ANTECUBITAL  Final   Special Requests   Final    BOTTLES DRAWN AEROBIC  AND ANAEROBIC Blood Culture adequate volume   Culture   Final    NO GROWTH < 24 HOURS Performed at [facility], [place] [address] [place], [place] [address]    Report Status PENDING  Incomplete      Radiology Studies: Dg Chest Portable 1 View  Result Date: 09/28/2017 CLINICAL DATA:  Shortness of breath and tachycardia for 1 week. EXAM: PORTABLE CHEST 1 VIEW COMPARISON:  May 27, 2017 FINDINGS: The heart size and mediastinal contours are stable. Patchy consolidation of right lung base is identified. There is no pulmonary edema or pleural effusion. The visualized skeletal structures are unremarkable. IMPRESSION: Patchy consolidation of right lung base suspicious for pneumonia. Electronically Signed   By: Abelardo Diesel M.D.   On: 09/28/2017 17:44    Scheduled Meds: . enoxaparin (LOVENOX) injection  40 mg Subcutaneous Q24H  . fluticasone  1 spray Each Nare Daily  . fluticasone furoate-vilanterol  1 puff Inhalation Daily  . ipratropium-albuterol  3 mL Nebulization TID  . metoprolol succinate  25 mg Oral Daily  . oseltamivir  75 mg Oral BID  . pantoprazole  40 mg Oral Daily   Continuous Infusions: . sodium chloride 125 mL/hr at 09/28/17 2310  . albuterol 10 mg/hr (09/28/17 2012)  . azithromycin    . cefTRIAXone (ROCEPHIN)  IV       LOS: 1 day   Time spent: 25 minutes.  Vance Gather, MD [facility] Pager [phone]  If 7PM-7AM, please contact night-coverage [email or web address] Password [facility] 09/29/2017, 4:04 PM

## 2017-09-30 LAB — HIV ANTIBODY (ROUTINE TESTING W REFLEX)
HIV SCREEN 4TH GENERATION: NONREACTIVE
HIV Screen 4th Generation wRfx: NONREACTIVE

## 2017-09-30 NOTE — Progress Notes (Signed)
PROGRESS NOTE  Roberto Horne  LOV:[id number] DOB: Jan 04, 1961 DOA: 09/28/2017 PCP: [facility], FNP   Brief Narrative: Roberto Horne is a 57 y.o. male with medical history significant of asthma on chronic oxygen and hypertension who presented to the ER with progressive shortness of breath and cough. Symptoms started about a week ago with nasal congestion then gradually productive cough with green sputum. He has some fever and chills. Denied any significant wheezing. Patient's brother was also sick and he believes he got it from his brother. Patient has no history of tobacco use. He has control asthma with no frequent exacerbation.  Pt found to be wheezing with CXR showing RLL infiltrate and flu swab positive. Antimicrobials were started and he was admitted. Treatment for asthma exacerbation is ongoing.   Assessment & Plan: Principal Problem:   CAP (community acquired pneumonia) Active Problems:   Asthma, mild persistent   Hypertension   Pneumonia   Obstructive sleep apnea  Asthma exacerbation on moderate persistent asthma due to CAP and influenza: - Lung exam still grossly abnormal. Continue steroids and inhaled bronchodilators scheduled and prn.  - Needs follow up with pulmonology (established w/[place]).  Luciana Axe for symbicort - Continue ceftriaxone, azithromycin, plan to narrow as able 4/8 if improving, and tamiflu x5 days.  - Monitor blood and sputum culture - Continue flonase, claritin  Chronic hypoxic respiratory failure: Of note, pt states he's on 2LPM O2 for 6 months though there is no mention of this in the record despite several office visits.  HTN: Stable.  - Continue metoprolol  OSA:  - CPAP qHS  Morbid obesity: BMI 35.  - Weight loss counseling provided  GERD: Stable - Continue PPI  Gout: Chronic.  - Continue colchicine prn  DVT prophylaxis: Lovenox Code Status: Full Family Communication: None at bedside Disposition Plan: Home when improved,  possibly next 24 hours based on respiratory status improvement  Consultants:   None  Procedures:   None  Antimicrobials:  Ceftriaxone, azithromycin 4/6 >>   Tamiflu 4/7 - 4/11   Subjective: Wheezing improved, dyspnea also improved but only modestly. Has not gotten up due to dyspnea. Not at baseline. Denies chest pain, fever  Objective: Vitals:   09/30/17 0035 09/30/17 0501 09/30/17 0845 09/30/17 1215  BP: (!) 100/56 122/78 115/69 126/67  Pulse: 70 68 72 68  Resp: 18 18 18 18   Temp: 98.4 F (36.9 C) 98.4 F (36.9 C) 98.6 F (37 C) 98.5 F (36.9 C)  TempSrc: Oral Oral Oral Oral  SpO2: 98% 96% 96% 98%  Weight:      Height:        Intake/Output Summary (Last 24 hours) at 09/30/2017 1623 Last data filed at 09/30/2017 0400 Gross per 24 hour  Intake 470 ml  Output -  Net 470 ml   Filed Weights   09/28/17 1652  Weight: 127 kg (280 lb)    Gen: 57 y.o. male in no distress Pulm: Non-labored breathing 2LPM at rest. Coarse wheezing bilaterally slightly improved with improved expiratory prolongation.  CV: Regular rate and rhythm. No murmur, rub, or gallop. No JVD, no pedal edema. GI: Abdomen soft, non-tender, non-distended, with normoactive bowel sounds. No organomegaly or masses felt. Ext: Warm, no deformities Skin: No rashes, lesions no ulcers Neuro: Alert and oriented. Speech impediment noted, stable. No focal neurological deficits. Psych: Judgement and insight appear normal. Cognitive ability may be subnormal. Mood & affect appropriate.   Data Reviewed: I have personally reviewed following labs and imaging studies  CBC: Recent Labs  Lab 09/28/17 1645 09/29/17 0749  WBC 5.2 2.0*  NEUTROABS  --  1.1*  HGB 13.5 13.3  HCT 42.1 42.5  MCV 77.5* 77.0*  PLT 129* 409*   Basic Metabolic Panel: Recent Labs  Lab 09/28/17 1645 09/29/17 0749  NA 139 136  K 3.6 3.9  CL 103 103  CO2 25 21*  GLUCOSE 94 216*  BUN 11 6  CREATININE 1.03 0.81  CALCIUM 8.6* 8.2*    GFR: Estimated Creatinine Clearance: 144.5 mL/min (by C-G formula based on SCr of 0.81 mg/dL). Liver Function Tests: Recent Labs  Lab 09/29/17 0749  AST 28  ALT 17  ALKPHOS 44  BILITOT 0.5  PROT 6.9  ALBUMIN 2.9*   No results for input(s): LIPASE, AMYLASE in the last 168 hours. No results for input(s): AMMONIA in the last 168 hours. Coagulation Profile: No results for input(s): INR, PROTIME in the last 168 hours. Cardiac Enzymes: Recent Labs  Lab 09/28/17 1645  TROPONINI <0.03   BNP (last 3 results) No results for input(s): PROBNP in the last 8760 hours. HbA1C: No results for input(s): HGBA1C in the last 72 hours. CBG: No results for input(s): GLUCAP in the last 168 hours. Lipid Profile: No results for input(s): CHOL, HDL, LDLCALC, TRIG, CHOLHDL, LDLDIRECT in the last 72 hours. Thyroid Function Tests: No results for input(s): TSH, T4TOTAL, FREET4, T3FREE, THYROIDAB in the last 72 hours. Anemia Panel: No results for input(s): VITAMINB12, FOLATE, FERRITIN, TIBC, IRON, RETICCTPCT in the last 72 hours. Urine analysis:    Component Value Date/Time   COLORURINE YELLOW 10/08/2015 1918   APPEARANCEUR CLEAR 10/08/2015 1918   LABSPEC 1.023 10/08/2015 1918   PHURINE 5.5 10/08/2015 1918   GLUCOSEU NEGATIVE 10/08/2015 1918   HGBUR NEGATIVE 10/08/2015 1918   BILIRUBINUR NEGATIVE 10/08/2015 1918   KETONESUR NEGATIVE 10/08/2015 1918   PROTEINUR NEGATIVE 10/08/2015 1918   UROBILINOGEN 1.0 11/06/2012 2222   NITRITE NEGATIVE 10/08/2015 1918   LEUKOCYTESUR NEGATIVE 10/08/2015 1918   Recent Results (from the past 240 hour(s))  Blood culture (routine x 2)     Status: None (Preliminary result)   Collection Time: 09/28/17  6:14 PM  Result Value Ref Range Status   Specimen Description BLOOD BLOOD RIGHT FOREARM  Final   Special Requests   Final    BOTTLES DRAWN AEROBIC AND ANAEROBIC Blood Culture adequate volume   Culture   Final    NO GROWTH 2 DAYS Performed at [facility], [address] [place], [place] [address]    Report Status PENDING  Incomplete  Blood culture (routine x 2)     Status: None (Preliminary result)   Collection Time: 09/28/17  6:30 PM  Result Value Ref Range Status   Specimen Description BLOOD RIGHT ANTECUBITAL  Final   Special Requests   Final    BOTTLES DRAWN AEROBIC AND ANAEROBIC Blood Culture adequate volume   Culture   Final    NO GROWTH 2 DAYS Performed at [facility], [place] [address], [place], [place] [address]    Report Status PENDING  Incomplete      Radiology Studies: Dg Chest Portable 1 View  Result Date: 09/28/2017 CLINICAL DATA:  Shortness of breath and tachycardia for 1 week. EXAM: PORTABLE CHEST 1 VIEW COMPARISON:  May 27, 2017 FINDINGS: The heart size and mediastinal contours are stable. Patchy consolidation of right lung base is identified. There is no pulmonary edema or pleural effusion. The visualized skeletal structures are unremarkable.  IMPRESSION: Patchy consolidation of right lung base suspicious for pneumonia. Electronically Signed   By: Abelardo Diesel M.D.   On: 09/28/2017 17:44    Scheduled Meds: . enoxaparin (LOVENOX) injection  40 mg Subcutaneous Q24H  . fluticasone  1 spray Each Nare Daily  . fluticasone furoate-vilanterol  1 puff Inhalation Daily  . ipratropium-albuterol  3 mL Nebulization TID  . metoprolol succinate  25 mg Oral Daily  . oseltamivir  75 mg Oral BID  . pantoprazole  40 mg Oral Daily  . predniSONE  40 mg Oral Q breakfast   Continuous Infusions: . azithromycin Stopped (09/29/17 1900)  . cefTRIAXone (ROCEPHIN)  IV Stopped (09/29/17 2000)     LOS: 2 days   Time spent: 25 minutes.  Vance Gather, MD [facility] Pager [phone]  If 7PM-7AM, please contact night-coverage [email or web address] Password [facility] 09/30/2017, 4:23 PM

## 2017-10-01 DIAGNOSIS — J111 Influenza due to unidentified influenza virus with other respiratory manifestations: Secondary | ICD-10-CM

## 2017-10-01 DIAGNOSIS — J4530 Mild persistent asthma, uncomplicated: Secondary | ICD-10-CM

## 2017-10-01 DIAGNOSIS — J9621 Acute and chronic respiratory failure with hypoxia: Secondary | ICD-10-CM

## 2017-10-01 MED ORDER — CEFDINIR 300 MG PO CAPS: 300.0000 mg | ORAL_CAPSULE | Freq: Two times a day (BID) | ORAL | 0 refills | Status: DC

## 2017-10-01 MED ORDER — PREDNISONE 20 MG PO TABS: 40.0000 mg | ORAL_TABLET | Freq: Every day | ORAL | 0 refills | Status: DC

## 2017-10-01 MED ORDER — OSELTAMIVIR PHOSPHATE 75 MG PO CAPS: 75.0000 mg | ORAL_CAPSULE | Freq: Two times a day (BID) | ORAL | 0 refills | Status: DC

## 2017-10-01 NOTE — Care Management Note (Signed)
Case Management Note  Patient Details  Name: Roberto Horne MRN: [id number] Date of Birth: 05/10/1961  Subjective/Objective:  Pt admitted with CAP. He is from home with his brother. Pt is active with [facility] for home oxygen at 2L .                   Action/Plan: No f/u per PT and no DME needs. CM reached out to a Hillard Danker listed yesterday without return call. CM following.  Expected Discharge Date:                  Expected Discharge Plan:  Home/Self Care  In-House Referral:     Discharge planning Services  CM Consult  Post Acute Care Choice:    Choice offered to:     DME Arranged:    DME Agency:     HH Arranged:    HH Agency:     Status of Service:  In process, will continue to follow  If discussed at Long Length of Stay Meetings, dates discussed:    Additional Comments:  Pollie Friar, RN 10/01/2017, 11:16 AM

## 2017-10-01 NOTE — Evaluation (Signed)
Physical Therapy Evaluation Patient Details Name: Roberto Horne MRN: [id number] DOB: 11-12-1960 Today's Date: 10/01/2017   History of Present Illness  Patient is a 57 y/o male who presents with productive cough, SOB and congestion. Found to have RLL PNA with asthma exacerbation and + Flu. PMH includes HTN, HLD, depression, mental impairment, gout.   Clinical Impression  Patient presents with dyspnea on exertion and impaired activity tolerance s/p above. Tolerated gait training with supervision for safety. Sp02 remained >93% on RA. Pt lives with brother and is independent PTA. Encouraged increasing activity while in the hospital. Anticipate rapid improvement. Pt does not require skilled therapy services as pt functioning close to baseline. All education completed. Discharge from therapy.     Follow Up Recommendations No PT follow up;Supervision - Intermittent    Equipment Recommendations  None recommended by PT    Recommendations for Other Services       Precautions / Restrictions Precautions Precautions: None Restrictions Weight Bearing Restrictions: No      Mobility  Bed Mobility               General bed mobility comments: Sitting in chair upon PT arrival.   Transfers Overall transfer level: Needs assistance Equipment used: None Transfers: Sit to/from Stand Sit to Stand: Supervision         General transfer comment: Supervision for safety with multiple attempts to stand from chair.   Ambulation/Gait Ambulation/Gait assistance: Supervision Ambulation Distance (Feet): 150 Feet Assistive device: None Gait Pattern/deviations: Step-through pattern;Decreased stride length;Wide base of support     General Gait Details: Slow, steady waddling like gait with 2/4 DOE. Sp02 remained >93% on RA. HR stable 50-90 bpm.  Stairs            Wheelchair Mobility    Modified Rankin (Stroke Patients Only)       Balance Overall balance assessment: Needs  assistance Sitting-balance support: Feet supported;No upper extremity supported Sitting balance-Leahy Scale: Good Sitting balance - Comments: Able to reach outside BoS and adjust socks without difficulty.    Standing balance support: During functional activity Standing balance-Leahy Scale: Good                               Pertinent Vitals/Pain Pain Assessment: No/denies pain    Home Living Family/patient expects to be discharged to:: Private residence Living Arrangements: Other relatives(brother and sister in law) Available Help at Discharge: Family;Available PRN/intermittently Type of Home: House Home Access: Level entry     Home Layout: One level Home Equipment: Crutches      Prior Function Level of Independence: Independent         Comments: Says he is in school right now part time. Does not drive.      Hand Dominance   Dominant Hand: Right    Extremity/Trunk Assessment   Upper Extremity Assessment Upper Extremity Assessment: Defer to OT evaluation    Lower Extremity Assessment Lower Extremity Assessment: Overall WFL for tasks assessed    Cervical / Trunk Assessment Cervical / Trunk Assessment: Normal  Communication   Communication: No difficulties  Cognition Arousal/Alertness: Awake/alert Behavior During Therapy: WFL for tasks assessed/performed Overall Cognitive Status: History of cognitive impairments - at baseline                                 General Comments: A&Ox4. Slow to respond at times but  hx of mental impairment.       General Comments      Exercises     Assessment/Plan    PT Assessment Patent does not need any further PT services  PT Problem List         PT Treatment Interventions      PT Goals (Current goals can be found in the Care Plan section)  Acute Rehab PT Goals Patient Stated Goal: to go home PT Goal Formulation: All assessment and education complete, DC therapy    Frequency      Barriers to discharge        Co-evaluation               AM-PAC PT "6 Clicks" Daily Activity  Outcome Measure Difficulty turning over in bed (including adjusting bedclothes, sheets and blankets)?: None Difficulty moving from lying on back to sitting on the side of the bed? : None Difficulty sitting down on and standing up from a chair with arms (e.g., wheelchair, bedside commode, etc,.)?: None Help needed moving to and from a bed to chair (including a wheelchair)?: None Help needed walking in hospital room?: None Help needed climbing 3-5 steps with a railing? : A Little 6 Click Score: 23    End of Session   Activity Tolerance: Patient tolerated treatment well Patient left: in chair;with call bell/phone within reach Nurse Communication: Mobility status PT Visit Diagnosis: Muscle weakness (generalized) (M62.81);Other (comment)(DOE)    Time: 1950-9326 PT Time Calculation (min) (ACUTE ONLY): 20 min   Charges:   PT Evaluation $PT Eval Low Complexity: 1 Low     PT G CodesWray Kearns, PT, DPT [phone]    Marguarite Arbour A Skya Mccullum 10/01/2017, 9:40 AM

## 2017-10-01 NOTE — Discharge Summary (Signed)
Physician Discharge Summary  Roberto Horne HKV:[id number] DOB: 04-02-1961 DOA: 09/28/2017  PCP: [facility], FNP  Admit date: 09/28/2017 Discharge date: 10/01/2017  Admitted From: Home Disposition: Home   Recommendations for Outpatient Follow-up:  1. Follow up with pulmonology in 1-2 weeks  Home Health: None Equipment/Devices: None Discharge Condition: Stable CODE STATUS: Full Diet recommendation: Heart healthy  Brief/Interim Summary: Roberto Horne a 57 y.o.malewith medical history significant ofasthma on chronic oxygen and hypertension who presented to the ER with progressive shortness of breath and cough. Symptoms started about a week ago with nasal congestion then gradually productive cough with green sputum. He has some fever and chills. Denied any significant wheezing. Patient's brother was also sick and he believes he got it from his brother. Patient has no history of tobacco use. He has control asthma with no frequent exacerbation.  Pt found to be wheezing with CXR showing RLL infiltrate and flu swab positive. Antimicrobials were started and he was admitted. Treatment for asthma exacerbation was added with steady improvement, return of respiratory status to baseline.  Discharge Diagnoses:  Principal Problem:   CAP (community acquired pneumonia) Active Problems:   Asthma, mild persistent   Hypertension   Pneumonia   Obstructive sleep apnea  Asthma exacerbation on moderate persistent asthma due to CAP and influenza: - Continue steroids and inhaled bronchodilators scheduled and prn at home  - Needs follow up with pulmonology (established w/[facility]).  - Started dulera for symbicort while inpatient - Continue omnicef, and tamiflu x5 days total.  - Monitor blood (NGTD) and sputum culture (pending at discharge) - Continue flonase, claritin prn  Chronic hypoxic respiratory failure: Of note, pt states he's been on 2LPM O2 (confirmed with home health) for 6 months  though there is no mention of this in the record despite several office visits. He remained >93% on room air with ambulation the day of discharge.   HTN: Stable.  - Continue metoprolol  OSA:  - CPAP qHS  Morbid obesity: BMI 35.  - Weight loss counseling provided  GERD: Stable - Continue PPI  Gout: Chronic.  - Continue colchicine prn  Discharge Instructions Discharge Instructions    Diet - low sodium heart healthy   Complete by:  As directed    Discharge instructions   Complete by:  As directed    You were admitted for pneumonia and the flu which caused an asthma exacerbation. Fortunately, you have improved and are stable for discharge with the following recommendations:  - Continue taking omnicef, tamiflu, and prednisone as prescribed until the prescriptions run out. These prescriptions were printed and provided at discharge. - Follow up with your pulmonologist in the next 2 weeks - If your symptoms return, seek medical attention right away.   Increase activity slowly   Complete by:  As directed      Allergies as of 10/01/2017      Reactions   Ibuprofen Itching      Medication List    STOP taking these medications   doxycycline 100 MG tablet Commonly known as:  VIBRA-TABS     TAKE these medications   AEROCHAMBER MV inhaler Use as instructed   albuterol 108 (90 Base) MCG/ACT inhaler Commonly known as:  PROVENTIL HFA;VENTOLIN HFA Inhale 2 puffs into the lungs every 4 (four) hours as needed for wheezing or shortness of breath.   budesonide-formoterol 160-4.5 MCG/ACT inhaler Commonly known as:  SYMBICORT Inhale 2 puffs into the lungs 2 (two) times daily.   budesonide-formoterol 160-4.5 MCG/ACT inhaler  Commonly known as:  SYMBICORT Inhale 2 puffs into the lungs 2 (two) times daily.   cefdinir 300 MG capsule Commonly known as:  OMNICEF Take 1 capsule (300 mg total) by mouth 2 (two) times daily.   colchicine 0.6 MG tablet Take 2 tablets at the onset of a  flare and 1 tablet 1 hour later. Repeat in 72 hours if needed.   oseltamivir 75 MG capsule Commonly known as:  TAMIFLU Take 1 capsule (75 mg total) by mouth 2 (two) times daily.   predniSONE 20 MG tablet Commonly known as:  DELTASONE Take 2 tablets (40 mg total) by mouth daily with breakfast. Start taking on:  10/02/2017      Follow-up Information    [facility], FNP Follow up.   Specialties:  Family Medicine, Infectious Diseases Contact information: [place] [address] [phone]        [facility]. Schedule an appointment as soon as possible for a visit in 2 week(s).   Specialty:  Pulmonology Contact information: [place] [phone]         Allergies  Allergen Reactions  . Ibuprofen Itching    Consultations:  None  Procedures/Studies: Dg Chest Portable 1 View  Result Date: 09/28/2017 CLINICAL DATA:  Shortness of breath and tachycardia for 1 week. EXAM: PORTABLE CHEST 1 VIEW COMPARISON:  May 27, 2017 FINDINGS: The heart size and mediastinal contours are stable. Patchy consolidation of right lung base is identified. There is no pulmonary edema or pleural effusion. The visualized skeletal structures are unremarkable. IMPRESSION: Patchy consolidation of right lung base suspicious for pneumonia. Electronically Signed   By: Abelardo Diesel M.D.   On: 09/28/2017 17:44   Subjective: Breathing back at baseline. We confirmed he has home oxygen but remained >93% on room air while ambulating with PT. No fever, cough improving.   Discharge Exam: Vitals:   10/01/17 0836 10/01/17 0900  BP:    Pulse:  (!) 50  Resp:    Temp:    SpO2: 94%    General: Pt is alert, awake, not in acute distress Cardiovascular: RRR, S1/S2 +, no rubs, no gallops Respiratory: Mild expiratory rhonchi without wheezing, stridor, or crackles. Nonlabored.  Abdominal: Soft, NT, ND, bowel sounds + Extremities: No edema,  no cyanosis  Labs: BNP (last 3 results) Recent Labs    09/28/17 1645  BNP 443.1*   Basic Metabolic Panel: Recent Labs  Lab 09/28/17 1645 09/29/17 0749  NA 139 136  K 3.6 3.9  CL 103 103  CO2 25 21*  GLUCOSE 94 216*  BUN 11 6  CREATININE 1.03 0.81  CALCIUM 8.6* 8.2*   Liver Function Tests: Recent Labs  Lab 09/29/17 0749  AST 28  ALT 17  ALKPHOS 44  BILITOT 0.5  PROT 6.9  ALBUMIN 2.9*   CBC: Recent Labs  Lab 09/28/17 1645 09/29/17 0749  WBC 5.2 2.0*  NEUTROABS  --  1.1*  HGB 13.5 13.3  HCT 42.1 42.5  MCV 77.5* 77.0*  PLT 129* 127*   Cardiac Enzymes: Recent Labs  Lab 09/28/17 1645  TROPONINI <0.03   Urinalysis    Component Value Date/Time   COLORURINE YELLOW 10/08/2015 1918   APPEARANCEUR CLEAR 10/08/2015 1918   LABSPEC 1.023 10/08/2015 1918   PHURINE 5.5 10/08/2015 1918   GLUCOSEU NEGATIVE 10/08/2015 1918   HGBUR NEGATIVE 10/08/2015 1918   BILIRUBINUR NEGATIVE 10/08/2015 1918   KETONESUR NEGATIVE 10/08/2015 1918   PROTEINUR  NEGATIVE 10/08/2015 1918   UROBILINOGEN 1.0 11/06/2012 2222   NITRITE NEGATIVE 10/08/2015 1918   LEUKOCYTESUR NEGATIVE 10/08/2015 1918    Microbiology Recent Results (from the past 240 hour(s))  Blood culture (routine x 2)     Status: None (Preliminary result)   Collection Time: 09/28/17  6:14 PM  Result Value Ref Range Status   Specimen Description BLOOD BLOOD RIGHT FOREARM  Final   Special Requests   Final    BOTTLES DRAWN AEROBIC AND ANAEROBIC Blood Culture adequate volume   Culture   Final    NO GROWTH 3 DAYS Performed at [facility], [place] [address], [place], [place] [address]    Report Status PENDING  Incomplete  Blood culture (routine x 2)     Status: None (Preliminary result)   Collection Time: 09/28/17  6:30 PM  Result Value Ref Range Status   Specimen Description BLOOD RIGHT ANTECUBITAL  Final   Special Requests   Final    BOTTLES DRAWN AEROBIC AND ANAEROBIC Blood Culture adequate volume    Culture   Final    NO GROWTH 3 DAYS Performed at [facility], [place] [address] [place], [place] [address]    Report Status PENDING  Incomplete    Time coordinating discharge: Approximately 40 minutes  Vance Gather, MD  [facility] 10/01/2017, 12:12 PM Pager [phone]

## 2017-10-01 NOTE — Care Management Note (Signed)
Case Management Note  Patient Details  Name: Roberto Horne MRN: [id number] Date of Birth: [birth year]  Subjective/Objective:                    Action/Plan: CM was able to finally speak to patients brother about him being discharged today. CM had attempted to reach [facility] without success yesterday and today. Pts brother Doren Custard: [phone] is at work but will pick up his brother today after 5 pm. Doren Custard states his brother used to be on home oxygen but has not been recently.  No needs per PT.  Bedside RN aware of when brother will pick up patient.  Expected Discharge Date:  10/01/17               Expected Discharge Plan:  Home/Self Care  In-House Referral:     Discharge planning Services  CM Consult  Post Acute Care Choice:    Choice offered to:     DME Arranged:    DME Agency:     HH Arranged:    HH Agency:     Status of Service:  Completed, signed off  If discussed at [employer] of Stay Meetings, dates discussed:    Additional Comments:  Pollie Friar, RN 10/01/2017, 3:04 PM

## 2017-10-01 NOTE — Progress Notes (Signed)
IV removed, discharge instructions given to Pt and Sister In [place], Pt left unit in wheelchair with staff, to be transferred home by [facility] [place].

## 2017-10-03 LAB — CULTURE, BLOOD (ROUTINE X 2)
CULTURE: NO GROWTH
CULTURE: NO GROWTH
SPECIAL REQUESTS: ADEQUATE
SPECIAL REQUESTS: ADEQUATE

## 2017-10-18 ENCOUNTER — Ambulatory Visit (INDEPENDENT_AMBULATORY_CARE_PROVIDER_SITE_OTHER): Payer: Medicare Other | Admitting: Family

## 2017-10-18 ENCOUNTER — Other Ambulatory Visit (INDEPENDENT_AMBULATORY_CARE_PROVIDER_SITE_OTHER): Payer: Medicare Other

## 2017-10-18 ENCOUNTER — Encounter: Payer: Self-pay | Admitting: Family

## 2017-10-18 VITALS — BP 128/70 | HR 87 | Temp 97.9°F | Ht 75.0 in | Wt 278.1 lb

## 2017-10-18 DIAGNOSIS — Z1159 Encounter for screening for other viral diseases: Secondary | ICD-10-CM | POA: Diagnosis not present

## 2017-10-18 DIAGNOSIS — Z23 Encounter for immunization: Secondary | ICD-10-CM | POA: Diagnosis not present

## 2017-10-18 DIAGNOSIS — Z13220 Encounter for screening for lipoid disorders: Secondary | ICD-10-CM

## 2017-10-18 DIAGNOSIS — R351 Nocturia: Secondary | ICD-10-CM

## 2017-10-18 DIAGNOSIS — Z125 Encounter for screening for malignant neoplasm of prostate: Secondary | ICD-10-CM

## 2017-10-18 DIAGNOSIS — I10 Essential (primary) hypertension: Secondary | ICD-10-CM | POA: Diagnosis not present

## 2017-10-18 DIAGNOSIS — J4540 Moderate persistent asthma, uncomplicated: Secondary | ICD-10-CM | POA: Diagnosis not present

## 2017-10-18 LAB — COMPREHENSIVE METABOLIC PANEL
ALT: 10 U/L (ref 0–53)
AST: 13 U/L (ref 0–37)
Albumin: 3.8 g/dL (ref 3.5–5.2)
Alkaline Phosphatase: 50 U/L (ref 39–117)
BUN: 19 mg/dL (ref 6–23)
CHLORIDE: 107 meq/L (ref 96–112)
CO2: 28 mEq/L (ref 19–32)
Calcium: 9.4 mg/dL (ref 8.4–10.5)
Creatinine, Ser: 1.02 mg/dL (ref 0.40–1.50)
GFR: 96.64 mL/min (ref >60.00)
GLUCOSE: 83 mg/dL (ref 70–99)
POTASSIUM: 4.5 meq/L (ref 3.5–5.1)
SODIUM: 143 meq/L (ref 135–145)
TOTAL PROTEIN: 7.2 g/dL (ref 6.0–8.3)
Total Bilirubin: 0.6 mg/dL (ref 0.2–1.2)

## 2017-10-18 LAB — CBC WITH DIFFERENTIAL/PLATELET
BASOS PCT: 0.7 % (ref 0.0–3.0)
Basophils Absolute: 0 10*3/uL (ref 0.0–0.1)
EOS PCT: 1.4 % (ref 0.0–5.0)
Eosinophils Absolute: 0.1 10*3/uL (ref 0.0–0.7)
HCT: 43.3 % (ref 39.0–52.0)
Hemoglobin: 13.6 g/dL (ref 13.0–17.0)
LYMPHS ABS: 2.1 10*3/uL (ref 0.7–4.0)
Lymphocytes Relative: 37.9 % (ref 12.0–46.0)
MCHC: 31.4 g/dL (ref 30.0–36.0)
MCV: 76.8 fl — ABNORMAL LOW (ref 78.0–100.0)
MONO ABS: 0.5 10*3/uL (ref 0.1–1.0)
Monocytes Relative: 8.6 % (ref 3.0–12.0)
NEUTROS ABS: 2.9 10*3/uL (ref 1.4–7.7)
NEUTROS PCT: 51.4 % (ref 43.0–77.0)
Platelets: 171 10*3/uL (ref 150.0–400.0)
RBC: 5.64 Mil/uL (ref 4.22–5.81)
RDW: 15.8 % — AB (ref 11.5–15.5)
WBC: 5.6 10*3/uL (ref 4.0–10.5)

## 2017-10-18 LAB — LIPID PANEL
Cholesterol: 204 mg/dL — ABNORMAL HIGH (ref 0–200)
HDL: 51.9 mg/dL (ref >39.00)
LDL CALC: 132 mg/dL — AB (ref 0–99)
NONHDL: 152.25
Total CHOL/HDL Ratio: 4
Triglycerides: 100 mg/dL (ref 0.0–149.0)
VLDL: 20 mg/dL (ref 0.0–40.0)

## 2017-10-18 LAB — PSA: PSA: 0.74 ng/mL (ref 0.10–4.00)

## 2017-10-18 MED ORDER — MOMETASONE FUROATE 0.1 % EX CREA: 1.0000 "application " | TOPICAL_CREAM | Freq: Two times a day (BID) | CUTANEOUS | 0 refills | Status: DC

## 2017-10-18 MED ORDER — METOPROLOL SUCCINATE ER 25 MG PO TB24: 25.0000 mg | ORAL_TABLET | Freq: Every day | ORAL | 0 refills | Status: DC

## 2017-10-18 NOTE — Progress Notes (Signed)
Roberto Horne is a 57 y.o. male with the following history as recorded in EpicCare:  Patient Active Problem List   Diagnosis Date Noted  . Mental retardation 12/03/2016  . Dental caries 12/03/2016  . Obesity 10/01/2016  . Left foot pain 08/30/2015  . Acute upper respiratory infection 08/30/2015  . Nasal polyps 07/21/2015  . Contusion, arm, upper 03/01/2015  . Hand contusion 03/01/2015  . Obstructive sleep apnea 12/16/2014  . Seasonal and perennial allergic rhinitis 09/21/2014  . Medicare annual wellness visit, subsequent 09/07/2014  . CAP (community acquired pneumonia) 07/07/2014  . Effusion of right knee 07/07/2014  . Hypertension 07/07/2014  . Abdominal pain 07/07/2014  . Pneumonia 07/07/2014  . Asthma, mild persistent 12/15/2012  . Gout, unspecified 12/15/2012  . Esophageal reflux 12/15/2012  . HTN (hypertension) 11/06/2012  . Hyperlipidemia 11/06/2012    Current Outpatient Medications  Medication Sig Dispense Refill  . albuterol (PROVENTIL HFA;VENTOLIN HFA) 108 (90 Base) MCG/ACT inhaler Inhale 2 puffs into the lungs every 4 (four) hours as needed for wheezing or shortness of breath. 6.7 g 2  . budesonide-formoterol (SYMBICORT) 160-4.5 MCG/ACT inhaler Inhale 2 puffs into the lungs 2 (two) times daily. 1 Inhaler 6  . colchicine 0.6 MG tablet Take 2 tablets at the onset of a flare and 1 tablet 1 hour later. Repeat in 72 hours if needed. 6 tablet 1  . Spacer/Aero-Holding Chambers (AEROCHAMBER MV) inhaler Use as instructed 1 each 0  . metoprolol succinate (TOPROL-XL) 25 MG 24 hr tablet Take 1 tablet (25 mg total) by mouth daily. 90 tablet 0  . mometasone (ELOCON) 0.1 % cream Apply 1 application topically 2 (two) times daily. 45 g 0   No current facility-administered medications for this visit.     Allergies: Ibuprofen  Past Medical History:  Diagnosis Date  . Allergy   . Arthritis   . Asthma   . Depression   . GERD (gastroesophageal reflux disease)   . Gout   .  Hyperlipemia   . Hypertension   . Mental impairment   . Pneumonia   . Sleep apnea     Past Surgical History:  Procedure Laterality Date  . BRAIN SURGERY    . FUNCTIONAL ENDOSCOPIC SINUS SURGERY  10/14/2015  . SINUS ENDO W/FUSION Bilateral 10/14/2015   Procedure: ENDOSCOPIC SINUS SURGERY WITH NAVIGATION;  Surgeon: Melida Quitter, MD;  Location: [facility] OR;  Service: ENT;  Laterality: Bilateral;    Family History  Problem Relation Age of Onset  . Arthritis Mother   . Hyperlipidemia Mother   . Hypertension Mother   . Diabetes Mother   . Stomach cancer Mother        possible, not 100 % sure  . Kidney disease Mother   . Arthritis Father   . Hypertension Father   . Diabetes Father   . Asthma Maternal Grandmother   . Rectal cancer Neg Hx   . Colon cancer Neg Hx     Social History   Tobacco Use  . Smoking status: Never Smoker  . Smokeless tobacco: Never Used  Substance Use Topics  . Alcohol use: No    Subjective:  Patient presents to transfer care from another provider in the office; needs to get updated Rx for his Toprol XL which he takes for his hypertension; under care of pulmonology for asthma- taking Symbicort as prescribed; doing much better and has not had to go to the ER since December; was recently hospitalized for pneumonia- feels that breathing is back to normal;  Is requesting to have paperwork updated so that he can play softball with [employer]; has played in the past and is excited about upcoming season.   Objective:  Vitals:   10/18/17 1431  BP: 128/70  Pulse: 87  Temp: 97.9 F (36.6 C)  TempSrc: Oral  SpO2: 95%  Weight: 278 lb 1.3 oz (126.1 kg)  Height: '6\' 3"'  (1.905 m)    General: Well developed, well nourished, in no acute distress  Skin : Warm and dry. C/w eczema on right forearm Head: Normocephalic and atraumatic  Eyes: Sclera and conjunctiva clear; pupils round and reactive to light; extraocular movements intact  Ears: External normal; canals clear;  tympanic membranes normal  Oropharynx: Pink, supple. No suspicious lesions  Neck: Supple without thyromegaly, adenopathy  Lungs: Respirations unlabored; clear to auscultation bilaterally without wheeze, rales, rhonchi  CVS exam: normal rate and regular rhythm.  Abdomen: Soft; nontender; nondistended; normoactive bowel sounds; no masses or hepatosplenomegaly  Musculoskeletal: No deformities; no active joint inflammation  Extremities: No edema, cyanosis, clubbing  Vessels: Symmetric bilaterally  Neurologic: Alert and oriented; speech intact; face symmetrical; moves all extremities well; CNII-XII intact without focal deficit   Assessment:  1. Essential hypertension   2. Lipid screening   3. Need for hepatitis C screening test   4. Prostate cancer screening   5. Nocturia   6. Moderate persistent asthma, unspecified whether complicated     Plan:  1. Stable; refill updated on Toprol XL 25 mg daily; check CBC, CMP today; follow-up in 3 months; 2. Lipid panel updated; 3. Hep C screen updated; 4. & 5. Check PSA; 6. Prevnar updated today; Control appears much improved; continue Symbicort; follow-up with his pulmonologist as scheduled.   Return in about 3 months (around 01/17/2018) for blood pressure follow-up.  Orders Placed This Encounter  Procedures  . Pneumococcal conjugate vaccine 13-valent  . CBC w/Diff    Standing Status:   Future    Number of Occurrences:   1    Standing Expiration Date:   10/18/2018  . Comp Met (CMET)    Standing Status:   Future    Number of Occurrences:   1    Standing Expiration Date:   10/18/2018  . Lipid panel    Standing Status:   Future    Number of Occurrences:   1    Standing Expiration Date:   10/19/2018  . Hepatitis C Antibody    Standing Status:   Future    Number of Occurrences:   1    Standing Expiration Date:   10/18/2018  . PSA    Standing Status:   Future    Number of Occurrences:   1    Standing Expiration Date:   10/18/2018    Requested  Prescriptions   Signed Prescriptions Disp Refills  . metoprolol succinate (TOPROL-XL) 25 MG 24 hr tablet 90 tablet 0    Sig: Take 1 tablet (25 mg total) by mouth daily.  . mometasone (ELOCON) 0.1 % cream 45 g 0    Sig: Apply 1 application topically 2 (two) times daily.

## 2017-10-21 LAB — HEPATITIS C ANTIBODY
HEP C AB: NONREACTIVE
SIGNAL TO CUT-OFF: 0.04 (ref <1.00)

## 2017-11-08 ENCOUNTER — Ambulatory Visit: Payer: Medicare Other | Admitting: Family

## 2017-11-08 DIAGNOSIS — Z0289 Encounter for other administrative examinations: Secondary | ICD-10-CM

## 2017-11-14 ENCOUNTER — Ambulatory Visit: Payer: Medicare Other | Admitting: Nurse Practitioner

## 2017-11-27 ENCOUNTER — Other Ambulatory Visit: Payer: Self-pay | Admitting: Acute Care

## 2017-12-10 ENCOUNTER — Telehealth: Payer: Self-pay | Admitting: Internal Medicine

## 2017-12-10 NOTE — Telephone Encounter (Signed)
Spoke with pt's sister-in-law, [name]. She is aware of the pt's sleep study results. [name] is going to discuss this the pt and get back [place] if he will use the CPAP. Nothing further was needed at this time.

## 2017-12-10 NOTE — Telephone Encounter (Signed)
Spoke with pt's sister-in-law, [name]. She is requesting the pt's sleep study from July 2018.  Dr. Annamaria Boots - please advise. Thanks.

## 2017-12-10 NOTE — Telephone Encounter (Signed)
Called patients sister-in-law unable to reach. Left message to give [place] a call back.

## 2017-12-10 NOTE — Telephone Encounter (Signed)
Pt sister -in- law is calling back [phone]

## 2017-12-10 NOTE — Telephone Encounter (Signed)
Home sleep test showed severe obstructive sleep apnea, averaging over 50 apneas/ hour, with drops in blood oxygen level. If sister in law thinks she can get him to wear CPAP this time, then I recommend :  Order new DME, new CPAP auto 5-20, mask of choice, humidifier, supplies, AirView   Dx OSA  He will need an ov in 31-90 days for CPAP follow-up, per insurance regs.

## 2017-12-16 ENCOUNTER — Ambulatory Visit: Payer: Medicare Other | Admitting: Internal Medicine

## 2017-12-30 ENCOUNTER — Encounter: Payer: Self-pay | Admitting: Internal Medicine

## 2017-12-30 ENCOUNTER — Ambulatory Visit (INDEPENDENT_AMBULATORY_CARE_PROVIDER_SITE_OTHER): Payer: Medicare Other | Admitting: Internal Medicine

## 2017-12-30 VITALS — BP 122/88 | HR 71 | Ht 75.0 in | Wt 282.0 lb

## 2017-12-30 DIAGNOSIS — J4530 Mild persistent asthma, uncomplicated: Secondary | ICD-10-CM | POA: Diagnosis not present

## 2017-12-30 DIAGNOSIS — G4733 Obstructive sleep apnea (adult) (pediatric): Secondary | ICD-10-CM | POA: Diagnosis not present

## 2017-12-30 DIAGNOSIS — F79 Unspecified intellectual disabilities: Secondary | ICD-10-CM

## 2017-12-30 DIAGNOSIS — J339 Nasal polyp, unspecified: Secondary | ICD-10-CM | POA: Diagnosis not present

## 2017-12-30 NOTE — Progress Notes (Signed)
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 Had sinus surgery CBC differential 07/21/2015-normal eos, total IgE 298 but normal environmental IgE allergy panel ------------------------------------------------------------------------------------------------------------------------ 12/03/16- 57 year old male never smoker followed for OSA/noncompliant with CPAP,  allergic rhinitis,, nasal polyps( triad),  HBP, asthma, complicated by obesity, Allergy NSAIDs/ibuprofen itching patient is in need of a sleep study. patient snors very loudly.  patient only sleeps about 3 hours at a time if the snoring dosen't wake you the bathroom does  Sister-in-law describes loud snoring and he admits daytime sleepiness. Previous evaluation failed because he wouldn't wear CPAP.  12/30/2017-  57 year old male (Special Needs) never smoker followed for OSA/noncompliant with CPAP,  allergic rhinitis,, nasal polyps( triad),  HBP, asthma, complicated by obesity, Allergy NSAIDs/ibuprofen itching Had been seen in March for asthma flare, had not been compliant with inhalers. Has not been using his CPAP.  Says "mask too tight on face".  Making comfort adjustments and understanding the purpose and benefits of CPAP is beyond his abilities.  Reportedly lives with his sister in law but she never comes with him.  He complains today of vague burning sensation in his right forearm.  He is not able to explain if this is recent, affected by use, or other clarifying detail. Breathing seems stable after apparently a pneumonia in the spring.  Little routine cough or phlegm.  He does not understand difference between maintenance and rescue inhalers. CXR  09/28/2017 IMPRESSION: Patchy consolidation of right lung base suspicious for pneumonia.  ROS-see HPI   + = positive Constitutional:    weight loss, night sweats, fevers, chills, fatigue, lassitude. HEENT:    headaches, difficulty swallowing, tooth/dental problems, sore throat,       sneezing, itching, ear ache, + nasal congestion, post nasal drip, snoring CV:    chest pain, orthopnea, PND, swelling in lower extremities, anasarca,                                                            dizziness, palpitations Resp:   shortness of breath with exertion or at rest.                productive cough,   non-productive cough, coughing up of blood.              change in color of mucus.  wheezing.   Skin:    rash or lesions. GI:  No-   heartburn, indigestion, abdominal pain, nausea, vomiting, diarrhea,                 change in bowel habits, loss of appetite GU: dysuria, change in color of urine, no urgency or frequency.   flank pain. MS:   joint pain, stiffness, decreased range of motion, back pain. Neuro-     nothing unusual Psych:  change in mood or affect.  depression or anxiety.   memory loss.  OBJ- Physical Exam General- Alert, Oriented, Affect-appropriate, Distress- none acute, + appears to be "special needs", childish affect Skin- rash-none, lesions- none, excoriation- none Lymphadenopathy- none Head- atraumatic  Eyes- Gross vision intact, PERRLA, conjunctivae and secretions clear            Ears- Hearing, canals-normal            Nose- Clear, no-Septal dev, mucus, polyps +, erosion, perforation             Throat- Mallampati III , mucosa clear , drainage- none, tonsils- atrophic, + very bed teeth Neck- flexible , trachea midline, no stridor , thyroid nl, carotid no bruit Chest - symmetrical excursion , unlabored           Heart/CV- RRR , no murmur , no gallop  , no rub, nl s1 s2                           - JVD- none , edema- none, stasis changes- none, varices- none            Lung- clear to P&A, wheeze- none, cough- none , dullness-none, rub- none           Chest wall-  Abd-  Br/ Gen/ Rectal- Not done, not indicated Extrem- cyanosis- none, clubbing, none, atrophy- none, strength- nl.  +Wearing a cloth sleeve wrapped around his lower right arm. Underneath looks ok, uses hand normally. Neuro- grossly intact to observation

## 2017-12-30 NOTE — Patient Instructions (Signed)
Try Tylenol pills for burning feeling in your arm and if it doesn't get better, see your primary care provider.  Continue Symbicort inhaler    2 puffs, then rinse mouth, twice daily, every day  Use your rescue inhaler   2 puffs every 6 hours, only if needed  Please call if we can help

## 2018-01-10 ENCOUNTER — Other Ambulatory Visit: Payer: Self-pay | Admitting: Family

## 2018-01-10 MED ORDER — METOPROLOL SUCCINATE ER 25 MG PO TB24: 25.0000 mg | ORAL_TABLET | Freq: Every day | ORAL | 1 refills | Status: DC

## 2018-01-15 ENCOUNTER — Other Ambulatory Visit: Payer: Self-pay | Admitting: *Deleted

## 2018-01-15 MED ORDER — MOMETASONE FUROATE 0.1 % EX CREA: 1.0000 "application " | TOPICAL_CREAM | Freq: Two times a day (BID) | CUTANEOUS | 0 refills | Status: DC

## 2018-02-10 ENCOUNTER — Encounter: Payer: Self-pay | Admitting: Internal Medicine

## 2018-02-10 NOTE — Assessment & Plan Note (Signed)
His intellectual disability and nasal obstruction are barriers to successful CPAP use.

## 2018-02-10 NOTE — Assessment & Plan Note (Signed)
History of nasal polypectomy.  Visible polyps now are not obviously obstructing in the anterior nares.

## 2018-02-10 NOTE — Assessment & Plan Note (Signed)
He has limited ability to understand instructions, goals and appropriate use of treatments offered.

## 2018-02-10 NOTE — Assessment & Plan Note (Signed)
I have gone over his medications and use again using language that I hope he understands. Plan-continue Symbicort maintenance inhaler, albuterol rescue inhaler if needed.

## 2018-05-02 ENCOUNTER — Ambulatory Visit: Payer: Medicare Other | Admitting: Internal Medicine

## 2018-05-14 ENCOUNTER — Emergency Department (HOSPITAL_COMMUNITY): Payer: Medicare Other

## 2018-05-14 ENCOUNTER — Emergency Department (HOSPITAL_COMMUNITY)
Admission: EM | Admit: 2018-05-14 | Discharge: 2018-05-14 | Disposition: A | Discharge status: Home / Self Care | Payer: Medicare Other | Attending: Emergency Medicine | Admitting: Emergency Medicine

## 2018-05-14 ENCOUNTER — Other Ambulatory Visit: Payer: Self-pay

## 2018-05-14 ENCOUNTER — Encounter (HOSPITAL_COMMUNITY): Payer: Self-pay

## 2018-05-14 DIAGNOSIS — I10 Essential (primary) hypertension: Secondary | ICD-10-CM | POA: Insufficient documentation

## 2018-05-14 DIAGNOSIS — J45901 Unspecified asthma with (acute) exacerbation: Secondary | ICD-10-CM | POA: Diagnosis not present

## 2018-05-14 DIAGNOSIS — Z79899 Other long term (current) drug therapy: Secondary | ICD-10-CM | POA: Insufficient documentation

## 2018-05-14 DIAGNOSIS — R0602 Shortness of breath: Secondary | ICD-10-CM | POA: Diagnosis present

## 2018-05-14 DIAGNOSIS — R05 Cough: Secondary | ICD-10-CM | POA: Diagnosis not present

## 2018-05-14 DIAGNOSIS — R0902 Hypoxemia: Secondary | ICD-10-CM | POA: Diagnosis not present

## 2018-05-14 DIAGNOSIS — J4541 Moderate persistent asthma with (acute) exacerbation: Secondary | ICD-10-CM | POA: Diagnosis not present

## 2018-05-14 DIAGNOSIS — R062 Wheezing: Secondary | ICD-10-CM | POA: Diagnosis not present

## 2018-05-14 LAB — BASIC METABOLIC PANEL
ANION GAP: 6 (ref 5–15)
BUN: 12 mg/dL (ref 6–20)
CHLORIDE: 108 mmol/L (ref 98–111)
CO2: 24 mmol/L (ref 22–32)
Calcium: 8.8 mg/dL — ABNORMAL LOW (ref 8.9–10.3)
Creatinine, Ser: 1.15 mg/dL (ref 0.61–1.24)
GFR calc Af Amer: >60 mL/min (ref >60)
GLUCOSE: 121 mg/dL — AB (ref 70–99)
POTASSIUM: 3.7 mmol/L (ref 3.5–5.1)
Sodium: 138 mmol/L (ref 135–145)

## 2018-05-14 LAB — I-STAT TROPONIN, ED: TROPONIN I, POC: 0 ng/mL (ref 0.00–0.08)

## 2018-05-14 LAB — CBC WITH DIFFERENTIAL/PLATELET
Abs Immature Granulocytes: 0.01 10*3/uL (ref 0.00–0.07)
Basophils Absolute: 0 10*3/uL (ref 0.0–0.1)
Basophils Relative: 0 %
EOS ABS: 0.2 10*3/uL (ref 0.0–0.5)
Eosinophils Relative: 3 %
HEMATOCRIT: 40.3 % (ref 39.0–52.0)
HEMOGLOBIN: 12 g/dL — AB (ref 13.0–17.0)
IMMATURE GRANULOCYTES: 0 %
LYMPHS ABS: 2.7 10*3/uL (ref 0.7–4.0)
LYMPHS PCT: 39 %
MCH: 23.7 pg — ABNORMAL LOW (ref 26.0–34.0)
MCHC: 29.8 g/dL — ABNORMAL LOW (ref 30.0–36.0)
MCV: 79.6 fL — AB (ref 80.0–100.0)
MONOS PCT: 9 %
Monocytes Absolute: 0.6 10*3/uL (ref 0.1–1.0)
NEUTROS PCT: 49 %
Neutro Abs: 3.4 10*3/uL (ref 1.7–7.7)
Platelets: 210 10*3/uL (ref 150–400)
RBC: 5.06 MIL/uL (ref 4.22–5.81)
RDW: 15.3 % (ref 11.5–15.5)
WBC: 7 10*3/uL (ref 4.0–10.5)
nRBC: 0 % (ref 0.0–0.2)

## 2018-05-14 LAB — BRAIN NATRIURETIC PEPTIDE: B Natriuretic Peptide: 52.6 pg/mL (ref 0.0–100.0)

## 2018-05-14 LAB — TSH: TSH: 1.311 u[IU]/mL (ref 0.350–4.500)

## 2018-05-14 MED ORDER — IPRATROPIUM-ALBUTEROL 0.5-2.5 (3) MG/3ML IN SOLN
3.0000 mL | Freq: Once | RESPIRATORY_TRACT | Status: AC
  Administered 2018-05-14: 3 mL via RESPIRATORY_TRACT
  Filled 2018-05-14: qty 3

## 2018-05-14 MED ORDER — PREDNISONE 20 MG PO TABS: 60.0000 mg | ORAL_TABLET | Freq: Every day | ORAL | 0 refills | Status: DC

## 2018-05-14 MED ORDER — MAGNESIUM SULFATE 2 GM/50ML IV SOLN
2.0000 g | Freq: Once | INTRAVENOUS | Status: AC
  Administered 2018-05-14: 2 g via INTRAVENOUS
  Filled 2018-05-14: qty 50

## 2018-05-14 MED ORDER — ALBUTEROL SULFATE (2.5 MG/3ML) 0.083% IN NEBU
5.0000 mg | INHALATION_SOLUTION | Freq: Once | RESPIRATORY_TRACT | Status: AC
Start: 2018-05-14 — End: 2018-05-14
  Administered 2018-05-14: 5 mg via RESPIRATORY_TRACT
  Filled 2018-05-14: qty 6

## 2018-05-14 MED ORDER — IPRATROPIUM BROMIDE 0.02 % IN SOLN
0.5000 mg | Freq: Once | RESPIRATORY_TRACT | Status: AC
  Administered 2018-05-14: 0.5 mg via RESPIRATORY_TRACT
  Filled 2018-05-14: qty 2.5

## 2018-05-14 MED ORDER — ALBUTEROL (5 MG/ML) CONTINUOUS INHALATION SOLN
5.0000 mg/h | INHALATION_SOLUTION | Freq: Once | RESPIRATORY_TRACT | Status: AC
  Administered 2018-05-14: 5 mg/h via RESPIRATORY_TRACT
  Filled 2018-05-14: qty 20

## 2018-05-14 MED ORDER — METHYLPREDNISOLONE SODIUM SUCC 125 MG IJ SOLR: 125.0000 mg | Freq: Once | INTRAMUSCULAR | Status: DC

## 2018-05-14 NOTE — ED Provider Notes (Signed)
[facility] EMERGENCY DEPARTMENT Provider Note   CSN: [id number] Arrival date & time: 05/14/18  1449     History   Chief Complaint Chief Complaint  Patient presents with  . Shortness of Breath    HPI Roberto Horne is a 57 y.o. male.  He [place] EMS [place] bus stop where he had acute worsening of shortness of breath.  He said he has had a cough and shortness of breath for about a week bringing up yellow-green sputum.  He does not think he had a fever.  He is a history of asthma and is been using an inhaler with minimal relief.  He denies smoking.  No chest pain no abdominal pain no nausea vomiting diarrhea.  The history is provided by the patient.  Shortness of Breath  This is a new problem. The average episode lasts 1 week. The problem occurs intermittently.The problem has been gradually worsening. Associated symptoms include rhinorrhea, sore throat, cough, sputum production and wheezing. Pertinent negatives include no fever, no headaches, no neck pain, no chest pain, no syncope, no vomiting, no abdominal pain, no rash, no leg pain and no leg swelling. It is unknown what precipitated the problem. He has tried leukotriene antagonists for the symptoms. The treatment provided mild relief.    Past Medical History:  Diagnosis Date  . Allergy   . Arthritis   . Asthma   . Depression   . GERD (gastroesophageal reflux disease)   . Gout   . Hyperlipemia   . Hypertension   . Mental impairment   . Pneumonia   . Sleep apnea     Patient Active Problem List   Diagnosis Date Noted  . Intellectual disability 12/03/2016  . Dental caries 12/03/2016  . Obesity 10/01/2016  . Left foot pain 08/30/2015  . Acute upper respiratory infection 08/30/2015  . Nasal polyps 07/21/2015  . Contusion, arm, upper 03/01/2015  . Hand contusion 03/01/2015  . Obstructive sleep apnea 12/16/2014  . Seasonal and perennial allergic rhinitis 09/21/2014  . Medicare annual wellness visit,  subsequent 09/07/2014  . CAP (community acquired pneumonia) 07/07/2014  . Effusion of right knee 07/07/2014  . Hypertension 07/07/2014  . Abdominal pain 07/07/2014  . Pneumonia 07/07/2014  . Asthma, mild persistent 12/15/2012  . Gout, unspecified 12/15/2012  . Esophageal reflux 12/15/2012  . HTN (hypertension) 11/06/2012  . Hyperlipidemia 11/06/2012    Past Surgical History:  Procedure Laterality Date  . BRAIN SURGERY    . FUNCTIONAL ENDOSCOPIC SINUS SURGERY  10/14/2015  . SINUS ENDO W/FUSION Bilateral 10/14/2015   Procedure: ENDOSCOPIC SINUS SURGERY WITH NAVIGATION;  Surgeon: Melida Quitter, MD;  Location: [place];  Service: ENT;  Laterality: Bilateral;        Home Medications    Prior to Admission medications   Medication Sig Start Date End Date Taking? Authorizing Provider  albuterol (PROVENTIL HFA;VENTOLIN HFA) 108 (90 Base) MCG/ACT inhaler Inhale 2 puffs into the lungs every 4 (four) hours as needed for wheezing or shortness of breath. 08/21/17   Deneise Lever, MD  budesonide-formoterol (SYMBICORT) 160-4.5 MCG/ACT inhaler Inhale 2 puffs into the lungs 2 (two) times daily. 08/29/17   Magdalen Spatz, NP  colchicine 0.6 MG tablet Take 2 tablets at the onset of a flare and 1 tablet 1 hour later. Repeat in 72 hours if needed. 03/12/17   Golden Circle, FNP  fluticasone (FLONASE) 50 MCG/ACT nasal spray PLACE 1 SPRAY INTO BOTH NOSTRILS DAILY. 11/27/17   Eric Form  F, NP  metoprolol succinate (TOPROL-XL) 25 MG 24 hr tablet Take 1 tablet (25 mg total) by mouth daily. 01/10/18   Marrian Salvage, FNP  mometasone (ELOCON) 0.1 % cream Apply 1 application topically 2 (two) times daily. 01/15/18   Marrian Salvage, FNP  Spacer/Aero-Holding Chambers (AEROCHAMBER MV) inhaler Use as instructed 08/21/17   Deneise Lever, MD    Family History Family History  Problem Relation Age of Onset  . Arthritis Mother   . Hyperlipidemia Mother   . Hypertension Mother   . Diabetes Mother     . Stomach cancer Mother        possible, not 100 % sure  . Kidney disease Mother   . Arthritis Father   . Hypertension Father   . Diabetes Father   . Asthma Maternal Grandmother   . Rectal cancer Neg Hx   . Colon cancer Neg Hx     Social History Social History   Tobacco Use  . Smoking status: Never Smoker  . Smokeless tobacco: Never Used  Substance Use Topics  . Alcohol use: No  . Drug use: No     Allergies   Ibuprofen   Review of Systems Review of Systems  Constitutional: Negative for fever.  HENT: Positive for rhinorrhea and sore throat.   Eyes: Negative for visual disturbance.  Respiratory: Positive for cough, sputum production, shortness of breath and wheezing.   Cardiovascular: Negative for chest pain, leg swelling and syncope.  Gastrointestinal: Negative for abdominal pain and vomiting.  Genitourinary: Negative for dysuria.  Musculoskeletal: Negative for neck pain.  Skin: Negative for rash.  Neurological: Negative for headaches.     Physical Exam Updated Vital Signs Ht 6\' 4"  (1.93 m)   Wt 111.1 kg   SpO2 99%   BMI 29.82 kg/m   Physical Exam  Constitutional: He appears well-developed and well-nourished.  HENT:  Head: Normocephalic and atraumatic.  Eyes: Conjunctivae are normal.  Neck: Neck supple.  Cardiovascular: Regular rhythm. Tachycardia present.  No murmur heard. Pulmonary/Chest: Accessory muscle usage present. No stridor. Tachypnea noted. No respiratory distress. He has wheezes.  Abdominal: Soft. There is no tenderness.  Musculoskeletal: Normal range of motion. He exhibits no edema, tenderness or deformity.  Neurological: He is alert. He has normal strength. No sensory deficit. GCS eye subscore is 4. GCS verbal subscore is 5. GCS motor subscore is 6.  Skin: Skin is warm and dry.  Psychiatric: He has a normal mood and affect.  Nursing note and vitals reviewed.    ED Treatments / Results  Labs (all labs ordered are listed, but only  abnormal results are displayed) Labs Reviewed  CBC WITH DIFFERENTIAL/PLATELET - Abnormal; Notable for the following components:      Result Value   Hemoglobin 12.0 (*)    MCV 79.6 (*)    MCH 23.7 (*)    MCHC 29.8 (*)    All other components within normal limits  BASIC METABOLIC PANEL - Abnormal; Notable for the following components:   Glucose, Bld 121 (*)    Calcium 8.8 (*)    All other components within normal limits  TSH  BRAIN NATRIURETIC PEPTIDE  I-STAT TROPONIN, ED    EKG EKG Interpretation  Date/Time:  Wednesday May 14 2018 14:59:22 EST Ventricular Rate:  99 PR Interval:    QRS Duration: 77 QT Interval:  285 QTC Calculation: 377 R Axis:   77 Text Interpretation:  atrial rhythm  Prolonged PR interval Low voltage, precordial leads Nonspecific repol  abnormality, inferior leads Minimal ST elevation, lateral leads Baseline wander in lead(s) V5 Confirmed by Aletta Edouard [phone]) on 05/14/2018 3:08:31 PM   Radiology Dg Chest 2 View  Result Date: 05/14/2018 CLINICAL DATA:  Pt c/o of sob, cough and wheezing for the past couple days. Pt denies cp. Pt has hx of asthma EXAM: CHEST - 2 VIEW COMPARISON:  09/28/2017 FINDINGS: The patient is rotated to the left on today's radiograph, reducing diagnostic sensitivity and specificity. Reverse lordotic projection. Mild enlargement of the cardiopericardial silhouette. No pleural effusion. No definite airspace opacity is identified. Linear opacities at the lung bases are likely primarily vascular. IMPRESSION: 1. Mild enlargement of the cardiopericardial silhouette, without edema. Electronically Signed   By: Van Clines M.D.   On: 05/14/2018 17:29    Procedures Procedures (including critical care time)  Medications Ordered in ED Medications  albuterol (PROVENTIL,VENTOLIN) solution continuous neb (5 mg/hr Nebulization Given 05/14/18 1520)  magnesium sulfate IVPB 2 g 50 mL (has no administration in time range)    methylPREDNISolone sodium succinate (SOLU-MEDROL) 125 mg/2 mL injection 125 mg (has no administration in time range)  albuterol (PROVENTIL) (2.5 MG/3ML) 0.083% nebulizer solution 5 mg (5 mg Nebulization Given 05/14/18 1517)  ipratropium (ATROVENT) nebulizer solution 0.5 mg (0.5 mg Nebulization Given 05/14/18 1517)  ipratropium-albuterol (DUONEB) 0.5-2.5 (3) MG/3ML nebulizer solution 3 mL (3 mLs Nebulization Given 05/14/18 1522)     Initial Impression / Assessment and Plan / ED Course  I have reviewed the triage vital signs and the nursing notes.  Pertinent labs & imaging results that were available during my care of the patient were reviewed by me and considered in my medical decision making (see chart for details).  Clinical Course as of May 15 2327  Wed May 14, 2018  1557 Patient received Solu-Medrol and nebulizer in the field.  Initial concern was for possible new onset A. fib although patient appears to be in sinus.  I have ordered him to get magnesium and continuous nebs.  Chest x-ray and lab work pending.   [MB]  1621 Reevaluated.  Patient is more comfortable.   [MB]  6433 She states he feels much better.  He is on his way over the x-ray now   [MB]    Clinical Course User Index [MB] Hayden Rasmussen, MD      Final Clinical Impressions(s) / ED Diagnoses   Final diagnoses:  Moderate asthma with exacerbation, unspecified whether persistent    ED Discharge Orders         Ordered    predniSONE (DELTASONE) 20 MG tablet  Daily     05/14/18 1713           Hayden Rasmussen, MD 05/14/18 2328

## 2018-05-14 NOTE — Discharge Instructions (Signed)
You were evaluated in the emergency department for increased shortness of breath and cough.  Had an EKG blood work and a chest x-ray.  You were improved with some steroids and breathing treatments.  We are prescribing you some steroids to take at home and you should continue to use your inhalers.  Follow-up with your doctor.  Return if any worsening symptoms.

## 2018-05-14 NOTE — ED Notes (Signed)
RN ambulated Pt on pulse ox approx 30-80ft. Pt remained above 95% o2 on ra

## 2018-05-14 NOTE — ED Notes (Signed)
Patient transported to X-ray 

## 2018-05-14 NOTE — ED Triage Notes (Signed)
Pt. [place] via [facility] [place] with c/o SOB, wheezing, and coughing. Pt found to be A fib on monitor with no history of same. Does have history of asthma with wheezing in all lobes per EMS. Pt used inhaler 2X prior to arrival with no change, EMS gave 10 albuterol, 1 Atrovent, and 125 solumedrol PTA. Pt did not complete neb prior to arrival

## 2018-07-17 ENCOUNTER — Emergency Department (HOSPITAL_COMMUNITY): Payer: Medicare Other

## 2018-07-17 ENCOUNTER — Emergency Department (HOSPITAL_COMMUNITY)
Admission: EM | Admit: 2018-07-17 | Discharge: 2018-07-17 | Disposition: A | Discharge status: Home / Self Care | Payer: Medicare Other | Attending: Emergency Medicine | Admitting: Emergency Medicine

## 2018-07-17 DIAGNOSIS — Z79899 Other long term (current) drug therapy: Secondary | ICD-10-CM | POA: Insufficient documentation

## 2018-07-17 DIAGNOSIS — J45901 Unspecified asthma with (acute) exacerbation: Secondary | ICD-10-CM

## 2018-07-17 DIAGNOSIS — R05 Cough: Secondary | ICD-10-CM | POA: Diagnosis not present

## 2018-07-17 DIAGNOSIS — E785 Hyperlipidemia, unspecified: Secondary | ICD-10-CM | POA: Insufficient documentation

## 2018-07-17 DIAGNOSIS — J4541 Moderate persistent asthma with (acute) exacerbation: Secondary | ICD-10-CM | POA: Diagnosis not present

## 2018-07-17 DIAGNOSIS — I10 Essential (primary) hypertension: Secondary | ICD-10-CM | POA: Diagnosis not present

## 2018-07-17 DIAGNOSIS — J069 Acute upper respiratory infection, unspecified: Secondary | ICD-10-CM | POA: Insufficient documentation

## 2018-07-17 DIAGNOSIS — R000 Tachycardia, unspecified: Secondary | ICD-10-CM | POA: Diagnosis not present

## 2018-07-17 DIAGNOSIS — R0602 Shortness of breath: Secondary | ICD-10-CM | POA: Diagnosis not present

## 2018-07-17 LAB — CBC
HCT: 42.7 % (ref 39.0–52.0)
HEMOGLOBIN: 13.3 g/dL (ref 13.0–17.0)
MCH: 24.2 pg — ABNORMAL LOW (ref 26.0–34.0)
MCHC: 31.1 g/dL (ref 30.0–36.0)
MCV: 77.8 fL — ABNORMAL LOW (ref 80.0–100.0)
Platelets: 179 10*3/uL (ref 150–400)
RBC: 5.49 MIL/uL (ref 4.22–5.81)
RDW: 15.3 % (ref 11.5–15.5)
WBC: 7.9 10*3/uL (ref 4.0–10.5)
nRBC: 0 % (ref 0.0–0.2)

## 2018-07-17 LAB — BASIC METABOLIC PANEL
ANION GAP: 9 (ref 5–15)
BUN: 10 mg/dL (ref 6–20)
CALCIUM: 9.4 mg/dL (ref 8.9–10.3)
CO2: 26 mmol/L (ref 22–32)
Chloride: 105 mmol/L (ref 98–111)
Creatinine, Ser: 1.05 mg/dL (ref 0.61–1.24)
GFR calc Af Amer: >60 mL/min (ref >60)
Glucose, Bld: 102 mg/dL — ABNORMAL HIGH (ref 70–99)
POTASSIUM: 3.7 mmol/L (ref 3.5–5.1)
SODIUM: 140 mmol/L (ref 135–145)

## 2018-07-17 MED ORDER — IPRATROPIUM-ALBUTEROL 0.5-2.5 (3) MG/3ML IN SOLN
3.0000 mL | Freq: Once | RESPIRATORY_TRACT | Status: AC
  Administered 2018-07-17: 3 mL via RESPIRATORY_TRACT
  Filled 2018-07-17: qty 3

## 2018-07-17 MED ORDER — PREDNISONE 20 MG PO TABS
60.0000 mg | ORAL_TABLET | Freq: Once | ORAL | Status: AC
  Administered 2018-07-17: 60 mg via ORAL
  Filled 2018-07-17: qty 3

## 2018-07-17 MED ORDER — ALBUTEROL SULFATE (2.5 MG/3ML) 0.083% IN NEBU
5.0000 mg | INHALATION_SOLUTION | Freq: Once | RESPIRATORY_TRACT | Status: AC
  Administered 2018-07-17: 5 mg via RESPIRATORY_TRACT
  Filled 2018-07-17: qty 6

## 2018-07-17 MED ORDER — PREDNISONE 50 MG PO TABS: 50.0000 mg | ORAL_TABLET | Freq: Every day | ORAL | 0 refills | Status: DC

## 2018-07-17 NOTE — ED Notes (Signed)
Pt ambulated in hallway, brisk pace and steady gate, maintained 96-98% on RA, no rest breaks needed, no dyspnea noted.

## 2018-07-17 NOTE — Discharge Instructions (Signed)
Take Prednisone once a day Take over the counter cough and cold medicine Please return if you are worsening

## 2018-07-17 NOTE — ED Triage Notes (Signed)
Pt endorses shob, coughing, sneezing x 5 days. Has been using inhaler without relief. spo2 88% on RA. Axox4. Speaking in complete sentences.

## 2018-07-17 NOTE — ED Provider Notes (Signed)
[facility] EMERGENCY DEPARTMENT Provider Note   CSN: [id number] Arrival date & time: 07/17/18  1122     History   Chief Complaint Chief Complaint  Patient presents with  . Shortness of Breath    HPI Roberto Horne is a 58 y.o. male who presents with URI symptoms and shortness of breath.  Past medical history significant for asthma, GERD, hypertension, hyperlipidemia.  The patient states that on Saturday he went to the movie theater and then started coughing, sneezing, wheezing.  He also has nasal congestion and rhinorrhea.  He was unable able to sleep due to his symptoms.  This morning his symptoms worsened therefore he called EMS.  He has been using his inhalers at home without significant relief.  He denies fever, lightheadedness, syncope, sore throat, chest pain, leg swelling.  His O2 sats with EMS were 88%.  HPI  Past Medical History:  Diagnosis Date  . Allergy   . Arthritis   . Asthma   . Depression   . GERD (gastroesophageal reflux disease)   . Gout   . Hyperlipemia   . Hypertension   . Mental impairment   . Pneumonia   . Sleep apnea     Patient Active Problem List   Diagnosis Date Noted  . Intellectual disability 12/03/2016  . Dental caries 12/03/2016  . Obesity 10/01/2016  . Left foot pain 08/30/2015  . Acute upper respiratory infection 08/30/2015  . Nasal polyps 07/21/2015  . Contusion, arm, upper 03/01/2015  . Hand contusion 03/01/2015  . Obstructive sleep apnea 12/16/2014  . Seasonal and perennial allergic rhinitis 09/21/2014  . Medicare annual wellness visit, subsequent 09/07/2014  . CAP (community acquired pneumonia) 07/07/2014  . Effusion of right knee 07/07/2014  . Hypertension 07/07/2014  . Abdominal pain 07/07/2014  . Pneumonia 07/07/2014  . Asthma, mild persistent 12/15/2012  . Gout, unspecified 12/15/2012  . Esophageal reflux 12/15/2012  . HTN (hypertension) 11/06/2012  . Hyperlipidemia 11/06/2012    Past Surgical  History:  Procedure Laterality Date  . BRAIN SURGERY    . FUNCTIONAL ENDOSCOPIC SINUS SURGERY  10/14/2015  . SINUS ENDO W/FUSION Bilateral 10/14/2015   Procedure: ENDOSCOPIC SINUS SURGERY WITH NAVIGATION;  Surgeon: Melida Quitter, MD;  Location: [facility];  Service: ENT;  Laterality: Bilateral;        Home Medications    Prior to Admission medications   Medication Sig Start Date End Date Taking? Authorizing Provider  albuterol (PROVENTIL HFA;VENTOLIN HFA) 108 (90 Base) MCG/ACT inhaler Inhale 2 puffs into the lungs every 4 (four) hours as needed for wheezing or shortness of breath. 08/21/17   Deneise Lever, MD  budesonide-formoterol (SYMBICORT) 160-4.5 MCG/ACT inhaler Inhale 2 puffs into the lungs 2 (two) times daily. 08/29/17   Magdalen Spatz, NP  colchicine 0.6 MG tablet Take 2 tablets at the onset of a flare and 1 tablet 1 hour later. Repeat in 72 hours if needed. 03/12/17   Golden Circle, FNP  fluticasone (FLONASE) 50 MCG/ACT nasal spray PLACE 1 SPRAY INTO BOTH NOSTRILS DAILY. 11/27/17   Magdalen Spatz, NP  metoprolol succinate (TOPROL-XL) 25 MG 24 hr tablet Take 1 tablet (25 mg total) by mouth daily. 01/10/18   Marrian Salvage, FNP  mometasone (ELOCON) 0.1 % cream Apply 1 application topically 2 (two) times daily. 01/15/18   Marrian Salvage, FNP  predniSONE (DELTASONE) 20 MG tablet Take 3 tablets (60 mg total) by mouth daily. 05/14/18   Hayden Rasmussen, MD  Spacer/Aero-Holding  Chambers (AEROCHAMBER MV) inhaler Use as instructed 08/21/17   Deneise Lever, MD    Family History Family History  Problem Relation Age of Onset  . Arthritis Mother   . Hyperlipidemia Mother   . Hypertension Mother   . Diabetes Mother   . Stomach cancer Mother        possible, not 100 % sure  . Kidney disease Mother   . Arthritis Father   . Hypertension Father   . Diabetes Father   . Asthma Maternal Grandmother   . Rectal cancer Neg Hx   . Colon cancer Neg Hx     Social History Social  History   Tobacco Use  . Smoking status: Never Smoker  . Smokeless tobacco: Never Used  Substance Use Topics  . Alcohol use: No  . Drug use: No     Allergies   Ibuprofen   Review of Systems Review of Systems  Constitutional: Negative for fever.  HENT: Positive for congestion, rhinorrhea and sneezing. Negative for ear pain.   Respiratory: Positive for cough, shortness of breath and wheezing.   Cardiovascular: Negative for chest pain and leg swelling.  All other systems reviewed and are negative.    Physical Exam Updated Vital Signs BP 132/87 (BP Location: Right Arm)   Pulse 62   Temp 98.1 F (36.7 C) (Oral)   Resp (!) 24   SpO2 (!) 89%   Physical Exam Vitals signs and nursing note reviewed.  Constitutional:      General: He is not in acute distress.    Appearance: He is well-developed. He is obese.     Comments: Calm and cooperative.  Talking on his cell phone.  HENT:     Head: Normocephalic and atraumatic.     Right Ear: There is impacted cerumen.     Left Ear: There is impacted cerumen.     Nose: Mucosal edema and congestion present.     Mouth/Throat:     Lips: Pink.     Mouth: Mucous membranes are moist.     Pharynx: Oropharynx is clear.  Eyes:     General: No scleral icterus.       Right eye: No discharge.        Left eye: No discharge.     Conjunctiva/sclera: Conjunctivae normal.     Pupils: Pupils are equal, round, and reactive to light.  Neck:     Musculoskeletal: Normal range of motion.  Cardiovascular:     Rate and Rhythm: Normal rate and regular rhythm.  Pulmonary:     Effort: Pulmonary effort is normal. No respiratory distress.     Breath sounds: Decreased breath sounds present.  Abdominal:     General: There is no distension.  Skin:    General: Skin is warm and dry.  Neurological:     Mental Status: He is alert and oriented to person, place, and time.  Psychiatric:        Behavior: Behavior normal.      ED Treatments / Results    Labs (all labs ordered are listed, but only abnormal results are displayed) Labs Reviewed  BASIC METABOLIC PANEL - Abnormal; Notable for the following components:      Result Value   Glucose, Bld 102 (*)    All other components within normal limits  CBC - Abnormal; Notable for the following components:   MCV 77.8 (*)    MCH 24.2 (*)    All other components within normal limits  CBC WITH  DIFFERENTIAL/PLATELET    EKG None  Radiology Dg Chest 2 View  Result Date: 07/17/2018 CLINICAL DATA:  Cough and wheezing with shortness of breath EXAM: CHEST - 2 VIEW COMPARISON:  May 14, 2018 FINDINGS: There is mild scarring in the left upper lobe. There is no appreciable edema or consolidation. The heart size and pulmonary vascularity are normal. No adenopathy. No bone lesions evident. IMPRESSION: Mild scarring left upper lobe. No edema or consolidation. Stable cardiac silhouette. Electronically Signed   By: Lowella Grip III M.D.   On: 07/17/2018 12:05    Procedures Procedures (including critical care time)  Medications Ordered in ED Medications  albuterol (PROVENTIL) (2.5 MG/3ML) 0.083% nebulizer solution 5 mg (5 mg Nebulization Given 07/17/18 1138)  predniSONE (DELTASONE) tablet 60 mg (60 mg Oral Given 07/17/18 1352)  ipratropium-albuterol (DUONEB) 0.5-2.5 (3) MG/3ML nebulizer solution 3 mL (3 mLs Nebulization Given 07/17/18 1352)     Initial Impression / Assessment and Plan / ED Course  I have reviewed the triage vital signs and the nursing notes.  Pertinent labs & imaging results that were available during my care of the patient were reviewed by me and considered in my medical decision making (see chart for details).  58 year old male presents with coughing, sneezing, wheezing for the past 5 days.  He has been using his home inhalers without significant relief.  O2 sats were 89% on arrival.  On initial exam heart is regular rate and rhythm.  Lungs have decreased breath sounds  bilaterally.  He is talking on the phone with a friend without any distress.  Chest x-ray is negative.  Will obtain labs, give another breathing treatment and dose of steroid here.  2:32 PM Rechecked pt. Lungs are CTA. He feels better. Will ambulate in hall.  Blood work is unremarkable.  Patient ambulated in the hallway and maintained his O2 sats.  He states he feels much better.  We will give him a prescription for prednisone and have him take over-the-counter medicines for cough and cold.  He was given strict return precautions.   Final Clinical Impressions(s) / ED Diagnoses   Final diagnoses:  Upper respiratory tract infection, unspecified type  Moderate asthma with exacerbation, unspecified whether persistent    ED Discharge Orders    None       Recardo Evangelist, PA-C 07/17/18 1514    Maudie Flakes, MD 07/18/18 [phone]

## 2018-07-23 ENCOUNTER — Encounter (HOSPITAL_COMMUNITY): Payer: Self-pay | Admitting: Emergency Medicine

## 2018-07-23 ENCOUNTER — Emergency Department (HOSPITAL_COMMUNITY)
Admission: EM | Admit: 2018-07-23 | Discharge: 2018-07-23 | Disposition: A | Discharge status: Home / Self Care | Payer: Medicare Other | Attending: Emergency Medicine | Admitting: Emergency Medicine

## 2018-07-23 ENCOUNTER — Other Ambulatory Visit: Payer: Self-pay

## 2018-07-23 ENCOUNTER — Emergency Department (HOSPITAL_COMMUNITY): Payer: Medicare Other

## 2018-07-23 DIAGNOSIS — T17500A Unspecified foreign body in bronchus causing asphyxiation, initial encounter: Secondary | ICD-10-CM

## 2018-07-23 DIAGNOSIS — I10 Essential (primary) hypertension: Secondary | ICD-10-CM | POA: Insufficient documentation

## 2018-07-23 DIAGNOSIS — R091 Pleurisy: Secondary | ICD-10-CM

## 2018-07-23 DIAGNOSIS — Z79899 Other long term (current) drug therapy: Secondary | ICD-10-CM | POA: Diagnosis not present

## 2018-07-23 DIAGNOSIS — R109 Unspecified abdominal pain: Secondary | ICD-10-CM | POA: Diagnosis present

## 2018-07-23 DIAGNOSIS — J4540 Moderate persistent asthma, uncomplicated: Secondary | ICD-10-CM | POA: Diagnosis not present

## 2018-07-23 DIAGNOSIS — J9809 Other diseases of bronchus, not elsewhere classified: Secondary | ICD-10-CM

## 2018-07-23 DIAGNOSIS — J4541 Moderate persistent asthma with (acute) exacerbation: Secondary | ICD-10-CM | POA: Diagnosis not present

## 2018-07-23 DIAGNOSIS — R079 Chest pain, unspecified: Secondary | ICD-10-CM | POA: Diagnosis not present

## 2018-07-23 LAB — COMPREHENSIVE METABOLIC PANEL
ALT: 11 U/L (ref 0–44)
AST: 12 U/L — ABNORMAL LOW (ref 15–41)
Albumin: 3.7 g/dL (ref 3.5–5.0)
Alkaline Phosphatase: 43 U/L (ref 38–126)
Anion gap: 11 (ref 5–15)
BUN: 14 mg/dL (ref 6–20)
CO2: 26 mmol/L (ref 22–32)
Calcium: 9.7 mg/dL (ref 8.9–10.3)
Chloride: 102 mmol/L (ref 98–111)
Creatinine, Ser: 0.92 mg/dL (ref 0.61–1.24)
GFR calc non Af Amer: >60 mL/min (ref >60)
Glucose, Bld: 96 mg/dL (ref 70–99)
Potassium: 3.1 mmol/L — ABNORMAL LOW (ref 3.5–5.1)
Sodium: 139 mmol/L (ref 135–145)
TOTAL PROTEIN: 7.9 g/dL (ref 6.5–8.1)
Total Bilirubin: 1.4 mg/dL — ABNORMAL HIGH (ref 0.3–1.2)

## 2018-07-23 LAB — I-STAT TROPONIN, ED: TROPONIN I, POC: 0 ng/mL (ref 0.00–0.08)

## 2018-07-23 LAB — CBC
HCT: 49.6 % (ref 39.0–52.0)
Hemoglobin: 15.1 g/dL (ref 13.0–17.0)
MCH: 23.4 pg — ABNORMAL LOW (ref 26.0–34.0)
MCHC: 30.4 g/dL (ref 30.0–36.0)
MCV: 77 fL — ABNORMAL LOW (ref 80.0–100.0)
PLATELETS: 211 10*3/uL (ref 150–400)
RBC: 6.44 MIL/uL — ABNORMAL HIGH (ref 4.22–5.81)
RDW: 15.7 % — ABNORMAL HIGH (ref 11.5–15.5)
WBC: 6.5 10*3/uL (ref 4.0–10.5)
nRBC: 0 % (ref 0.0–0.2)

## 2018-07-23 LAB — LIPASE, BLOOD: Lipase: 26 U/L (ref 11–51)

## 2018-07-23 LAB — BRAIN NATRIURETIC PEPTIDE: B Natriuretic Peptide: 56.4 pg/mL (ref 0.0–100.0)

## 2018-07-23 MED ORDER — AZITHROMYCIN 250 MG PO TABS
500.0000 mg | ORAL_TABLET | Freq: Once | ORAL | Status: AC
  Administered 2018-07-23: 500 mg via ORAL
  Filled 2018-07-23: qty 2

## 2018-07-23 MED ORDER — IOPAMIDOL (ISOVUE-370) INJECTION 76%
100.0000 mL | Freq: Once | INTRAVENOUS | Status: AC | PRN
  Administered 2018-07-23: 100 mL via INTRAVENOUS

## 2018-07-23 MED ORDER — FLUTICASONE PROPIONATE 50 MCG/ACT NA SUSP: NASAL | 1 refills | Status: DC

## 2018-07-23 MED ORDER — ALBUTEROL SULFATE (2.5 MG/3ML) 0.083% IN NEBU
2.5000 mg | INHALATION_SOLUTION | Freq: Once | RESPIRATORY_TRACT | Status: AC
  Administered 2018-07-23: 2.5 mg via RESPIRATORY_TRACT
  Filled 2018-07-23: qty 3

## 2018-07-23 MED ORDER — IPRATROPIUM-ALBUTEROL 0.5-2.5 (3) MG/3ML IN SOLN
3.0000 mL | Freq: Once | RESPIRATORY_TRACT | Status: AC
  Administered 2018-07-23: 3 mL via RESPIRATORY_TRACT
  Filled 2018-07-23: qty 3

## 2018-07-23 MED ORDER — IOPAMIDOL (ISOVUE-370) INJECTION 76%
INTRAVENOUS | Status: AC
  Filled 2018-07-23: qty 100

## 2018-07-23 MED ORDER — AZITHROMYCIN 250 MG PO TABS: 250.0000 mg | ORAL_TABLET | Freq: Every day | ORAL | 0 refills | Status: DC

## 2018-07-23 MED ORDER — ALBUTEROL SULFATE HFA 108 (90 BASE) MCG/ACT IN AERS: 2.0000 | INHALATION_SPRAY | RESPIRATORY_TRACT | 2 refills | Status: DC | PRN

## 2018-07-23 MED ORDER — BUDESONIDE-FORMOTEROL FUMARATE 160-4.5 MCG/ACT IN AERO: 2.0000 | INHALATION_SPRAY | Freq: Two times a day (BID) | RESPIRATORY_TRACT | 6 refills | Status: DC

## 2018-07-23 NOTE — Discharge Instructions (Signed)
1.  Your Symbicort, albuterol and Flonase have been reordered to your pharmacy.  You also have a prescription for Zithromax to pick up.  He should start that tomorrow.  Take your Symbicort twice daily and your albuterol every 4-6 hours for the next 3 to 5 days then as needed. 2.  Call your pulmonologist tomorrow morning, Dr. Baird Lyons, and schedule an appointment for recheck as soon as possible.

## 2018-07-23 NOTE — ED Triage Notes (Signed)
C/o pain to L upper leg, R lateral side, and L side abd.  Denies nausea, vomiting, and diarrhea.

## 2018-07-23 NOTE — ED Provider Notes (Signed)
[facility] EMERGENCY DEPARTMENT Provider Note   CSN: [id number] Arrival date & time: 07/23/18  1711     History   Chief Complaint Chief Complaint  Patient presents with  . Abdominal Pain    HPI Roberto Horne is a 58 y.o. male.  HPI Patient reports he is getting sharp pains in his right side of the chest.  He reports he has an area where he is getting a stabbing pain that is worse with a deep breath or trying to cough.  It is been there for about 2 days.  He denies he had a fever.  He reports sometimes he coughs up mucus.  (After completing evaluation and diagnostic studies, patient does admit that he has run out of his Symbicort and albuterol.  He initially reported he was using his albuterol).  Patient reports he also gets pain in his feet that is burning in quality.  He reports that he said pain on the left leg indicates the side of the leg and behind the leg.  The burning in the feet has gone on for quite a while.  He reports it never gets better.  No vomiting or diarrhea. Past Medical History:  Diagnosis Date  . Allergy   . Arthritis   . Asthma   . Depression   . GERD (gastroesophageal reflux disease)   . Gout   . Hyperlipemia   . Hypertension   . Mental impairment   . Pneumonia   . Sleep apnea     Patient Active Problem List   Diagnosis Date Noted  . Intellectual disability 12/03/2016  . Dental caries 12/03/2016  . Obesity 10/01/2016  . Left foot pain 08/30/2015  . Acute upper respiratory infection 08/30/2015  . Nasal polyps 07/21/2015  . Contusion, arm, upper 03/01/2015  . Hand contusion 03/01/2015  . Obstructive sleep apnea 12/16/2014  . Seasonal and perennial allergic rhinitis 09/21/2014  . Medicare annual wellness visit, subsequent 09/07/2014  . CAP (community acquired pneumonia) 07/07/2014  . Effusion of right knee 07/07/2014  . Hypertension 07/07/2014  . Abdominal pain 07/07/2014  . Pneumonia 07/07/2014  . Asthma, mild persistent  12/15/2012  . Gout, unspecified 12/15/2012  . Esophageal reflux 12/15/2012  . HTN (hypertension) 11/06/2012  . Hyperlipidemia 11/06/2012    Past Surgical History:  Procedure Laterality Date  . BRAIN SURGERY    . FUNCTIONAL ENDOSCOPIC SINUS SURGERY  10/14/2015  . SINUS ENDO W/FUSION Bilateral 10/14/2015   Procedure: ENDOSCOPIC SINUS SURGERY WITH NAVIGATION;  Surgeon: Melida Quitter, MD;  Location: [facility];  Service: ENT;  Laterality: Bilateral;        Home Medications    Prior to Admission medications   Medication Sig Start Date End Date Taking? Authorizing Provider  albuterol (PROVENTIL HFA;VENTOLIN HFA) 108 (90 Base) MCG/ACT inhaler Inhale 2 puffs into the lungs every 4 (four) hours as needed for wheezing or shortness of breath. 08/21/17  Yes Young, Tarri Fuller D, MD  budesonide-formoterol [facility]) 160-4.5 MCG/ACT inhaler Inhale 2 puffs into the lungs 2 (two) times daily. 08/29/17  Yes Magdalen Spatz, NP  colchicine 0.6 MG tablet Take 2 tablets at the onset of a flare and 1 tablet 1 hour later. Repeat in 72 hours if needed. Patient taking differently: Take 1-2 mg by mouth See admin instructions. Take 2 tablets at the onset of a flare and 1 tablet 1 hour later. Repeat in 72 hours if needed. 03/12/17  Yes Golden Circle, FNP  fluticasone (FLONASE) 50 MCG/ACT nasal  spray PLACE 1 SPRAY INTO BOTH NOSTRILS DAILY. Patient taking differently: Place 1 spray into both nostrils daily.  11/27/17  Yes Magdalen Spatz, NP  Menthol (COUGH DROPS) 3.1 MG LOZG Use as directed 1 lozenge in the mouth or throat as needed (cough).    Yes [provider]  metoprolol succinate (TOPROL-XL) 25 MG 24 hr tablet Take 1 tablet (25 mg total) by mouth daily. 01/10/18  Yes Marrian Salvage, FNP  mometasone (ELOCON) 0.1 % cream Apply 1 application topically 2 (two) times daily. 01/15/18  Yes Marrian Salvage, FNP  sodium chloride (OCEAN) 0.65 % SOLN nasal spray Place 1 spray into both nostrils as needed for  congestion.   Yes [provider]  Spacer/Aero-Holding Chambers (AEROCHAMBER MV) inhaler Use as instructed 08/21/17  Yes Young, Tarri Fuller D, MD  predniSONE (DELTASONE) 50 MG tablet Take 1 tablet (50 mg total) by mouth daily. Patient not taking: Reported on 07/23/2018 07/17/18   Recardo Evangelist, PA-C    Family History Family History  Problem Relation Age of Onset  . Arthritis Mother   . Hyperlipidemia Mother   . Hypertension Mother   . Diabetes Mother   . Stomach cancer Mother        possible, not 100 % sure  . Kidney disease Mother   . Arthritis Father   . Hypertension Father   . Diabetes Father   . Asthma Maternal Grandmother   . Rectal cancer Neg Hx   . Colon cancer Neg Hx     Social History Social History   Tobacco Use  . Smoking status: Never Smoker  . Smokeless tobacco: Never Used  Substance Use Topics  . Alcohol use: No  . Drug use: No     Allergies   Ibuprofen   Review of Systems Review of Systems 10 Systems reviewed and are negative for acute change except as noted in the HPI.   Physical Exam Updated Vital Signs BP (!) 149/98   Pulse (!) 44   Temp 98 F (36.7 C) (Oral)   Resp 11   SpO2 100%   Physical Exam Constitutional:      Comments: Patient is alert and nontoxic.  No respiratory distress.  Obesity.  Patient exhibits speech delay/ impediment but is interactive and alert.  HENT:     Head: Normocephalic and atraumatic.     Mouth/Throat:     Mouth: Mucous membranes are moist.  Neck:     Musculoskeletal: Neck supple.  Cardiovascular:     Comments: Patient has bradycardia.  No gross rub murmur gallop.  Monitor shows sinus rhythm ranging from mid 40s to 60s.  Patient has no clinical symptoms associated. Pulmonary:     Comments: No respiratory distress.  Normal work of breathing.  Patient does have a coarse wheeze throughout mid and lower lung fields. Abdominal:     General: There is no distension.     Palpations: Abdomen is soft.      Tenderness: There is no abdominal tenderness. There is no guarding.  Musculoskeletal:     Comments: 1+ edema bilateral lower legs.  Left lower leg seems slightly more edematous than right.  Bilateral pedal pulses 2+.  No wound or erythema.  Skin:    General: Skin is warm and dry.  Neurological:     Comments: Patient is alert and interactive.  He has some speech patterns consistent with cognitive delay.  He however does adequate job of communicating his history and concerns.  No focal motor  deficits.  Psychiatric:        Mood and Affect: Mood normal.      ED Treatments / Results  Labs (all labs ordered are listed, but only abnormal results are displayed) Labs Reviewed  COMPREHENSIVE METABOLIC PANEL - Abnormal; Notable for the following components:      Result Value   Potassium 3.1 (*)    AST 12 (*)    Total Bilirubin 1.4 (*)    All other components within normal limits  CBC - Abnormal; Notable for the following components:   RBC 6.44 (*)    MCV 77.0 (*)    MCH 23.4 (*)    RDW 15.7 (*)    All other components within normal limits  LIPASE, BLOOD  BRAIN NATRIURETIC PEPTIDE  URINALYSIS, ROUTINE W REFLEX MICROSCOPIC  I-STAT TROPONIN, ED    EKG EKG Interpretation  Date/Time:  Wednesday July 23 2018 18:10:54 EST Ventricular Rate:  52 PR Interval:    QRS Duration: 101 QT Interval:  498 QTC Calculation: 464 R Axis:   70 Text Interpretation:  Sinus rhythm Atrial premature complex RSR' in V1 or V2, right VCD or RVH no ischemic changescompared to previous, PACs. Confirmed by Charlesetta Shanks [phone]) on 07/23/2018 10:07:01 PM   Radiology Ct Angio Chest Pe W/cm &/or Wo Cm  Result Date: 07/23/2018 CLINICAL DATA:  Chest pain, complex, intermediate/high prob of ACS/PE/AAS. EXAM: CT ANGIOGRAPHY CHEST WITH CONTRAST TECHNIQUE: Multidetector CT imaging of the chest was performed using the standard protocol during bolus administration of intravenous contrast. Multiplanar CT image  reconstructions and MIPs were obtained to evaluate the vascular anatomy. CONTRAST:  176mL ISOVUE-370 IOPAMIDOL (ISOVUE-370) INJECTION 76% COMPARISON:  Radiographs 07/17/2018. Chest CT 07/07/2014 FINDINGS: Cardiovascular: There are no filling defects within the pulmonary arteries to suggest pulmonary embolus. Heart is normal in size. Thoracic aorta is normal in caliber, can not assess for dissection given phase of IV contrast. No pericardial effusion. Mediastinum/Nodes: Small mediastinal nodes are not enlarged by size criteria. No hilar adenopathy. Small hiatal hernia, esophagus is otherwise decompressed. Lungs/Pleura: Mild chronic volume loss in the left lung. Central bronchial thickening with areas of mucous plugging the right lower and right middle lobes. No debris in the trachea. No focal airspace disease. No pulmonary edema. No pleural effusion. No suspicious pulmonary mass. Previous nodular densities in the left lung on prior CT have resolved. Upper Abdomen: Small hiatal hernia. No acute findings. Musculoskeletal: There are no acute or suspicious osseous abnormalities. Review of the MIP images confirms the above findings. IMPRESSION: 1. No pulmonary embolus. 2. Central bronchial thickening with areas of mucous plugging in the right middle and lower lobes. 3. Small hiatal hernia. Electronically Signed   By: Keith Rake M.D.   On: 07/23/2018 20:38    Procedures Procedures (including critical care time)  Medications Ordered in ED Medications  iopamidol (ISOVUE-370) 76 % injection (has no administration in time range)  ipratropium-albuterol (DUONEB) 0.5-2.5 (3) MG/3ML nebulizer solution 3 mL (3 mLs Nebulization Given 07/23/18 1840)  iopamidol (ISOVUE-370) 76 % injection 100 mL (100 mLs Intravenous Contrast Given 07/23/18 2013)  azithromycin (ZITHROMAX) tablet 500 mg (500 mg Oral Given 07/23/18 2137)  albuterol (PROVENTIL) (2.5 MG/3ML) 0.083% nebulizer solution 2.5 mg (2.5 mg Nebulization Given 07/23/18  2137)     Initial Impression / Assessment and Plan / ED Course  I have reviewed the triage vital signs and the nursing notes.  Pertinent labs & imaging results that were available during my care of the patient were reviewed  by me and considered in my medical decision making (see chart for details).    Patient presents with pleurisy on the right side.  He did have complaint of lower extremity pain which have predominantly neuralgic quality.  Burning in the feet.  He does have mild edema slightly greater left to right.  Dorsalis pedis pulses symmetric.  CT scan obtained to rule out pulmonary embolus.  None present.  However mucus plugging identified.  This corresponds on the right side to the patient's pleuritic type symptoms.  I suspect this is the etiology of his pain.  He does have wheezing and known asthma.  Patient is just finishing prednisone.  I will opt to treat with a course of Zithromax and refill the patient's inhalers.  He reports he has not had his Symbicort nor his albuterol.  Patient does have some cognitive delay.  He seems appropriately oriented to his medical care however it is difficult to know how compliant he is and how aware he is of which medications he has not does not have.  He is counseled for close follow-up with his pulmonologist.  Final Clinical Impressions(s) / ED Diagnoses   Final diagnoses:  Pleurisy  Mucus plugging of bronchi  Moderate persistent asthma without complication    ED Discharge Orders    None       Charlesetta Shanks, MD 07/23/18 2213

## 2018-07-24 ENCOUNTER — Other Ambulatory Visit: Payer: Self-pay | Admitting: Family

## 2018-07-28 ENCOUNTER — Ambulatory Visit: Payer: Medicare Other

## 2018-07-28 ENCOUNTER — Encounter: Payer: Self-pay | Admitting: Family

## 2018-07-28 ENCOUNTER — Encounter: Payer: Self-pay | Admitting: Family Medicine

## 2018-07-28 ENCOUNTER — Ambulatory Visit (INDEPENDENT_AMBULATORY_CARE_PROVIDER_SITE_OTHER): Payer: Medicare Other | Admitting: Family

## 2018-07-28 ENCOUNTER — Ambulatory Visit (INDEPENDENT_AMBULATORY_CARE_PROVIDER_SITE_OTHER): Payer: Medicare Other | Admitting: Family Medicine

## 2018-07-28 ENCOUNTER — Ambulatory Visit (INDEPENDENT_AMBULATORY_CARE_PROVIDER_SITE_OTHER): Payer: Medicare Other

## 2018-07-28 VITALS — BP 132/70 | HR 66 | Temp 98.3°F | Ht 71.0 in

## 2018-07-28 VITALS — BP 138/82 | HR 74 | Temp 98.4°F | Ht 71.0 in

## 2018-07-28 DIAGNOSIS — M25562 Pain in left knee: Secondary | ICD-10-CM | POA: Insufficient documentation

## 2018-07-28 NOTE — Patient Instructions (Signed)
Good to see you  Please try ice on the knee. 20 minutes at a time and 3-4 times daily  Please take tylenol for pain  Please see me back in 2 weeks.

## 2018-07-28 NOTE — Progress Notes (Signed)
Roberto Horne is a 58 y.o. male with the following history as recorded in Epic[employer]:  Patient Active Problem List   Diagnosis Date Noted  . Intellectual disability 12/03/2016  . Dental caries 12/03/2016  . Obesity 10/01/2016  . Left foot pain 08/30/2015  . Acute upper respiratory infection 08/30/2015  . Nasal polyps 07/21/2015  . Contusion, arm, upper 03/01/2015  . Hand contusion 03/01/2015  . Obstructive sleep apnea 12/16/2014  . Seasonal and perennial allergic rhinitis 09/21/2014  . Medicare annual wellness visit, subsequent 09/07/2014  . CAP (community acquired pneumonia) 07/07/2014  . Effusion of right knee 07/07/2014  . Hypertension 07/07/2014  . Abdominal pain 07/07/2014  . Pneumonia 07/07/2014  . Asthma, mild persistent 12/15/2012  . Gout, unspecified 12/15/2012  . Esophageal reflux 12/15/2012  . HTN (hypertension) 11/06/2012  . Hyperlipidemia 11/06/2012    Current Outpatient Medications  Medication Sig Dispense Refill  . albuterol (PROVENTIL HFA;VENTOLIN HFA) 108 (90 Base) MCG/ACT inhaler Inhale 2 puffs into the lungs every 4 (four) hours as needed for wheezing or shortness of breath. 6.7 g 2  . azithromycin (ZITHROMAX) 250 MG tablet Take 1 tablet (250 mg total) by mouth daily. 4 tablet 0  . budesonide-formoterol (SYMBICORT) 160-4.5 MCG/ACT inhaler Inhale 2 puffs into the lungs 2 (two) times daily. 1 Inhaler 6  . colchicine 0.6 MG tablet Take 2 tablets at the onset of a flare and 1 tablet 1 hour later. Repeat in 72 hours if needed. (Patient taking differently: Take 1-2 mg by mouth See admin instructions. Take 2 tablets at the onset of a flare and 1 tablet 1 hour later. Repeat in 72 hours if needed.) 6 tablet 1  . fluticasone (FLONASE) 50 MCG/ACT nasal spray PLACE 1 SPRAY INTO BOTH NOSTRILS DAILY. 16 g 1  . Menthol (COUGH DROPS) 3.1 MG LOZG Use as directed 1 lozenge in the mouth or throat as needed (cough).     . metoprolol succinate (TOPROL-XL) 25 MG 24 hr tablet TAKE 1  TABLET BY MOUTH EVERY DAY 90 tablet 0  . mometasone (ELOCON) 0.1 % cream Apply 1 application topically 2 (two) times daily. 45 g 0  . predniSONE (DELTASONE) 50 MG tablet Take 1 tablet (50 mg total) by mouth daily. 5 tablet 0  . sodium chloride (OCEAN) 0.65 % SOLN nasal spray Place 1 spray into both nostrils as needed for congestion.    Marland Kitchen Spacer/Aero-Holding Chambers (AEROCHAMBER MV) inhaler Use as instructed 1 each 0   No current facility-administered medications for this visit.     Allergies: Ibuprofen  Past Medical History:  Diagnosis Date  . Allergy   . Arthritis   . Asthma   . Depression   . GERD (gastroesophageal reflux disease)   . Gout   . Hyperlipemia   . Hypertension   . Mental impairment   . Pneumonia   . Sleep apnea     Past Surgical History:  Procedure Laterality Date  . BRAIN SURGERY    . FUNCTIONAL ENDOSCOPIC SINUS SURGERY  10/14/2015  . SINUS ENDO W/FUSION Bilateral 10/14/2015   Procedure: ENDOSCOPIC SINUS SURGERY WITH NAVIGATION;  Surgeon: Melida Quitter, MD;  Location: [facility] OR;  Service: ENT;  Laterality: Bilateral;    Family History  Problem Relation Age of Onset  . Arthritis Mother   . Hyperlipidemia Mother   . Hypertension Mother   . Diabetes Mother   . Stomach cancer Mother        possible, not 100 % sure  . Kidney disease Mother   .  Arthritis Father   . Hypertension Father   . Diabetes Father   . Asthma Maternal Grandmother   . Rectal cancer Neg Hx   . Colon cancer Neg Hx     Social History   Tobacco Use  . Smoking status: Never Smoker  . Smokeless tobacco: Never Used  Substance Use Topics  . Alcohol use: No    Subjective:  Patient tripped while getting off the bus last Sunday; notes that his shoes were slick and caused him to fall; patient notes that he fell backwards- was helped up by another passenger; then patient thinks he stepped wrong and twisted his left knee; per patient, he is able to bear weight/ entire knee just hurts;       Objective:  Vitals:   07/28/18 1121  BP: 138/82  Pulse: 74  Temp: 98.4 F (36.9 C)  TempSrc: Oral  SpO2: 95%  Height: 5\' 11"  (1.803 m)    G Assessment:  No diagnosis found.  Plan:  Appt cancelled; sent to sports medicine  No follow-ups on file.  No orders of the defined types were placed in this encounter.   Requested Prescriptions    No prescriptions requested or ordered in this encounter

## 2018-07-28 NOTE — Progress Notes (Signed)
Roberto Horne - 58 y.o. male MRN [id number]  Date of birth: [birth year]  SUBJECTIVE:  Including CC & ROS.  No chief complaint on file.   Roberto Horne is a 58 y.o. male that is presenting with acute left knee pain.  He fell over this weekend while he was getting off the bus.  He had slick shoes on as the reason why he fell.  Since that time he is having significant left knee pain and swelling.  He has trouble walking has been limping around.  The pain is worse and throbbing at night.  Not improved with home modalities.  No history of similar symptoms.  Pain is severe and localized to the knee.  Has some knee posteriorly as well as anteriorly.   Review of Systems  Constitutional: Negative for fever.  HENT: Negative for congestion.   Respiratory: Negative for cough.   Cardiovascular: Negative for chest pain.  Gastrointestinal: Negative for abdominal pain.  Musculoskeletal: Positive for gait problem and joint swelling.  Skin: Negative for color change.  Neurological: Negative for weakness.  Hematological: Negative for adenopathy.  Psychiatric/Behavioral: Negative for agitation.    HISTORY: Past Medical, Surgical, Social, and Family History Reviewed & Updated per EMR.   Pertinent Historical Findings include:  Past Medical History:  Diagnosis Date  . Allergy   . Arthritis   . Asthma   . Depression   . GERD (gastroesophageal reflux disease)   . Gout   . Hyperlipemia   . Hypertension   . Mental impairment   . Pneumonia   . Sleep apnea     Past Surgical History:  Procedure Laterality Date  . BRAIN SURGERY    . FUNCTIONAL ENDOSCOPIC SINUS SURGERY  10/14/2015  . SINUS ENDO W/FUSION Bilateral 10/14/2015   Procedure: ENDOSCOPIC SINUS SURGERY WITH NAVIGATION;  Surgeon: Melida Quitter, MD;  Location: [facility];  Service: ENT;  Laterality: Bilateral;    Allergies  Allergen Reactions  . Ibuprofen Itching    Family History  Problem Relation Age of Onset  . Arthritis Mother   .  Hyperlipidemia Mother   . Hypertension Mother   . Diabetes Mother   . Stomach cancer Mother        possible, not 100 % sure  . Kidney disease Mother   . Arthritis Father   . Hypertension Father   . Diabetes Father   . Asthma Maternal Grandmother   . Rectal cancer Neg Hx   . Colon cancer Neg Hx      Social History   Socioeconomic History  . Marital status: Single    Spouse name: Not on file  . Number of children: 0  . Years of education: 32  . Highest education level: Not on file  Occupational History  . Not on file  Social Needs  . Financial resource strain: Not on file  . Food insecurity:    Worry: Not on file    Inability: Not on file  . Transportation needs:    Medical: Not on file    Non-medical: Not on file  Tobacco Use  . Smoking status: Never Smoker  . Smokeless tobacco: Never Used  Substance and Sexual Activity  . Alcohol use: No  . Drug use: No  . Sexual activity: Never  Lifestyle  . Physical activity:    Days per week: Not on file    Minutes per session: Not on file  . Stress: Not on file  Relationships  . Social connections:  Talks on phone: Not on file    Gets together: Not on file    Attends religious service: Not on file    Active member of club or organization: Not on file    Attends meetings of clubs or organizations: Not on file    Relationship status: Not on file  . Intimate partner violence:    Fear of current or ex partner: Not on file    Emotionally abused: Not on file    Physically abused: Not on file    Forced sexual activity: Not on file  Other Topics Concern  . Not on file  Social History Narrative   Fun - everything   Denies religious beliefs effecting health care.      PHYSICAL EXAM:  VS: BP 132/70   Pulse 66   Temp 98.3 F (36.8 C) (Oral)   Ht 5\' 11"  (1.803 m)   SpO2 94%   BMI 34.13 kg/m  Physical Exam Gen: NAD, alert, cooperative with exam, well-appearing ENT: normal lips, normal nasal mucosa,  Eye: normal  EOM, normal conjunctiva and lids CV:  no edema, +2 pedal pulses   Resp: no accessory muscle use, non-labored,  Skin: no rashes, no areas of induration  Neuro: normal tone, normal sensation to touch Psych:  normal insight, alert and oriented MSK:  Left knee: Effusion present. Limited range of motion secondary to pain in extension and flexion. Exam limited secondary to pain. No instability. Tender to palpation of the medial lateral joint line. No ecchymosis. Neurovascularly intact   Aspiration/Injection Procedure Note Roberto Horne 12/18/1960  Procedure: Aspiration and Injection Indications: Left knee pain  Procedure Details Consent: Risks of procedure as well as the alternatives and risks of each were explained to the (patient/caregiver).  Consent for procedure obtained. Time Out: Verified patient identification, verified procedure, site/side was marked, verified correct patient position, special equipment/implants available, medications/allergies/relevent history reviewed, required imaging and test results available.  Performed.  The area was cleaned with iodine and alcohol swabs.    The left knee superior lateral suprapatellar pouch was injected using 3 cc of 1% lidocaine without epinephrine on a 22-gauge inch and a half needle.  An 18-gauge inch and half needle was inserted to achieve aspiration.  The syringe was then switched and a mixture containing 1 cc's of 40 mg Kenalog and 4 cc's of 0.25% bupivacaine.  Ultrasound was used. Images were obtained in long views showing the injection.    Amount of Fluid Aspirated: 81mL Character of Fluid: bloody Fluid was sent for:n/a  A sterile dressing was applied.  Patient did tolerate procedure well.       ASSESSMENT & PLAN:   Acute pain of left knee Independent review of the x-ray does not demonstrate a fracture.  Had a bloody effusion so possible for intra-articular process or patellar dislocation with spontaneous reduction.    - aspiration and injection  - xray  - applied ACE wrap  - f/u in 2 weeks. May need PT going forward.

## 2018-07-28 NOTE — Assessment & Plan Note (Signed)
Independent review of the x-ray does not demonstrate a fracture.  Had a bloody effusion so possible for intra-articular process or patellar dislocation with spontaneous reduction.  - aspiration and injection  - xray  - applied ACE wrap  - f/u in 2 weeks. May need PT going forward.

## 2018-08-07 ENCOUNTER — Encounter (HOSPITAL_COMMUNITY): Payer: Self-pay | Admitting: Emergency Medicine

## 2018-08-07 ENCOUNTER — Emergency Department (HOSPITAL_COMMUNITY)
Admission: EM | Admit: 2018-08-07 | Discharge: 2018-08-07 | Disposition: A | Discharge status: Home / Self Care | Payer: Medicare Other | Attending: Emergency Medicine | Admitting: Emergency Medicine

## 2018-08-07 ENCOUNTER — Emergency Department (HOSPITAL_COMMUNITY): Payer: Medicare Other

## 2018-08-07 ENCOUNTER — Other Ambulatory Visit: Payer: Self-pay

## 2018-08-07 DIAGNOSIS — J45909 Unspecified asthma, uncomplicated: Secondary | ICD-10-CM | POA: Insufficient documentation

## 2018-08-07 DIAGNOSIS — I10 Essential (primary) hypertension: Secondary | ICD-10-CM | POA: Diagnosis not present

## 2018-08-07 DIAGNOSIS — Z79899 Other long term (current) drug therapy: Secondary | ICD-10-CM | POA: Insufficient documentation

## 2018-08-07 DIAGNOSIS — M1711 Unilateral primary osteoarthritis, right knee: Secondary | ICD-10-CM | POA: Diagnosis not present

## 2018-08-07 DIAGNOSIS — R52 Pain, unspecified: Secondary | ICD-10-CM | POA: Diagnosis not present

## 2018-08-07 DIAGNOSIS — R609 Edema, unspecified: Secondary | ICD-10-CM | POA: Diagnosis not present

## 2018-08-07 DIAGNOSIS — M179 Osteoarthritis of knee, unspecified: Secondary | ICD-10-CM | POA: Diagnosis not present

## 2018-08-07 DIAGNOSIS — M25461 Effusion, right knee: Secondary | ICD-10-CM | POA: Insufficient documentation

## 2018-08-07 DIAGNOSIS — M25561 Pain in right knee: Secondary | ICD-10-CM | POA: Diagnosis not present

## 2018-08-07 DIAGNOSIS — R5381 Other malaise: Secondary | ICD-10-CM | POA: Diagnosis not present

## 2018-08-07 LAB — SYNOVIAL CELL COUNT + DIFF, W/ CRYSTALS
Crystals, Fluid: NONE SEEN
Lymphocytes-Synovial Fld: 1 % (ref 0–20)
Monocyte-Macrophage-Synovial Fluid: 24 % — ABNORMAL LOW (ref 50–90)
NEUTROPHIL, SYNOVIAL: 75 % — AB (ref 0–25)
WBC, Synovial: 7700 /mm3 — ABNORMAL HIGH (ref 0–200)

## 2018-08-07 LAB — CBC WITH DIFFERENTIAL/PLATELET
Abs Immature Granulocytes: 0.03 10*3/uL (ref 0.00–0.07)
Basophils Absolute: 0 10*3/uL (ref 0.0–0.1)
Basophils Relative: 0 %
Eosinophils Absolute: 0 10*3/uL (ref 0.0–0.5)
Eosinophils Relative: 0 %
HCT: 46.5 % (ref 39.0–52.0)
Hemoglobin: 14.1 g/dL (ref 13.0–17.0)
Immature Granulocytes: 0 %
Lymphocytes Relative: 18 %
Lymphs Abs: 1.8 10*3/uL (ref 0.7–4.0)
MCH: 24.3 pg — ABNORMAL LOW (ref 26.0–34.0)
MCHC: 30.3 g/dL (ref 30.0–36.0)
MCV: 80 fL (ref 80.0–100.0)
Monocytes Absolute: 0.9 10*3/uL (ref 0.1–1.0)
Monocytes Relative: 9 %
Neutro Abs: 7 10*3/uL (ref 1.7–7.7)
Neutrophils Relative %: 73 %
PLATELETS: 136 10*3/uL — AB (ref 150–400)
RBC: 5.81 MIL/uL (ref 4.22–5.81)
RDW: 15.9 % — ABNORMAL HIGH (ref 11.5–15.5)
WBC: 9.7 10*3/uL (ref 4.0–10.5)
nRBC: 0 % (ref 0.0–0.2)

## 2018-08-07 LAB — BASIC METABOLIC PANEL
Anion gap: 9 (ref 5–15)
BUN: 12 mg/dL (ref 6–20)
CO2: 26 mmol/L (ref 22–32)
CREATININE: 0.96 mg/dL (ref 0.61–1.24)
Calcium: 9.3 mg/dL (ref 8.9–10.3)
Chloride: 104 mmol/L (ref 98–111)
GFR calc Af Amer: >60 mL/min (ref >60)
GFR calc non Af Amer: >60 mL/min (ref >60)
GLUCOSE: 118 mg/dL — AB (ref 70–99)
Potassium: 3.8 mmol/L (ref 3.5–5.1)
Sodium: 139 mmol/L (ref 135–145)

## 2018-08-07 MED ORDER — HYDROCODONE-ACETAMINOPHEN 5-325 MG PO TABS
2.0000 | ORAL_TABLET | Freq: Once | ORAL | Status: AC
  Administered 2018-08-07: 2 via ORAL
  Filled 2018-08-07: qty 2

## 2018-08-07 MED ORDER — ACETAMINOPHEN 325 MG PO TABS
650.0000 mg | ORAL_TABLET | Freq: Once | ORAL | Status: AC
  Administered 2018-08-07: 650 mg via ORAL
  Filled 2018-08-07: qty 2

## 2018-08-07 MED ORDER — HYDROCODONE-ACETAMINOPHEN 5-325 MG PO TABS: 1.0000 | ORAL_TABLET | ORAL | 0 refills | Status: DC | PRN

## 2018-08-07 MED ORDER — HYDROMORPHONE HCL 1 MG/ML IJ SOLN
1.0000 mg | Freq: Once | INTRAMUSCULAR | Status: AC
  Administered 2018-08-07: 1 mg via INTRAVENOUS
  Filled 2018-08-07: qty 1

## 2018-08-07 MED ORDER — LIDOCAINE-EPINEPHRINE (PF) 2 %-1:200000 IJ SOLN
10.0000 mL | Freq: Once | INTRAMUSCULAR | Status: AC
  Administered 2018-08-07: 10 mL
  Filled 2018-08-07: qty 20

## 2018-08-07 NOTE — Discharge Instructions (Signed)
If you develop fever, worsening pain or swelling in the knee, redness or warmth/heat to the knee, or any other new/concerning symptoms then return to the ER for evaluation.

## 2018-08-07 NOTE — ED Notes (Signed)
Bed: [facility] Expected date:  Expected time:  Means of arrival:  Comments: EMS 57yo knee pain

## 2018-08-07 NOTE — ED Provider Notes (Signed)
[facility] DEPT Provider Note   CSN: [id number] Arrival date & time: 08/07/18  1046     History   Chief Complaint Chief Complaint  Patient presents with  . Knee Pain    HPI Roberto Horne is a 57 y.o. male.  HPI  58 year old male presents with right leg swelling and pain. Started about 5 days ago. No trauma. Hurt his left knee a few weeks ago, that has healed. No chest pain, dyspnea. No headache, weakness or numbness. No fevers. Pain is severe. Is asking for tylenol for pain.  Past Medical History:  Diagnosis Date  . Allergy   . Arthritis   . Asthma   . Depression   . GERD (gastroesophageal reflux disease)   . Gout   . Hyperlipemia   . Hypertension   . Mental impairment   . Pneumonia   . Sleep apnea     Patient Active Problem List   Diagnosis Date Noted  . Acute pain of left knee 07/28/2018  . Intellectual disability 12/03/2016  . Dental caries 12/03/2016  . Obesity 10/01/2016  . Left foot pain 08/30/2015  . Acute upper respiratory infection 08/30/2015  . Nasal polyps 07/21/2015  . Contusion, arm, upper 03/01/2015  . Hand contusion 03/01/2015  . Obstructive sleep apnea 12/16/2014  . Seasonal and perennial allergic rhinitis 09/21/2014  . Medicare annual wellness visit, subsequent 09/07/2014  . CAP (community acquired pneumonia) 07/07/2014  . Effusion of right knee 07/07/2014  . Hypertension 07/07/2014  . Abdominal pain 07/07/2014  . Pneumonia 07/07/2014  . Asthma, mild persistent 12/15/2012  . Gout, unspecified 12/15/2012  . Esophageal reflux 12/15/2012  . HTN (hypertension) 11/06/2012  . Hyperlipidemia 11/06/2012    Past Surgical History:  Procedure Laterality Date  . BRAIN SURGERY    . FUNCTIONAL ENDOSCOPIC SINUS SURGERY  10/14/2015  . SINUS ENDO W/FUSION Bilateral 10/14/2015   Procedure: ENDOSCOPIC SINUS SURGERY WITH NAVIGATION;  Surgeon: Melida Quitter, MD;  Location: [facility];  Service: ENT;  Laterality: Bilateral;         Home Medications    Prior to Admission medications   Medication Sig Start Date End Date Taking? Authorizing Provider  albuterol (PROVENTIL HFA;VENTOLIN HFA) 108 (90 Base) MCG/ACT inhaler Inhale 2 puffs into the lungs every 4 (four) hours as needed for wheezing or shortness of breath. 07/23/18  Yes Pfeiffer, Jeannie Done, MD  budesonide-formoterol (SYMBICORT) 160-4.5 MCG/ACT inhaler Inhale 2 puffs into the lungs 2 (two) times daily. 07/23/18  Yes Pfeiffer, Jeannie Done, MD  fluticasone (FLONASE) 50 MCG/ACT nasal spray PLACE 1 SPRAY INTO BOTH NOSTRILS DAILY. Patient taking differently: Place 1 spray into both nostrils daily. PLACE 1 SPRAY INTO BOTH NOSTRILS DAILY. 07/23/18  Yes Pfeiffer, Jeannie Done, MD  Menthol (COUGH DROPS) 3.1 MG LOZG Use as directed 1 lozenge in the mouth or throat as needed (cough).    Yes [provider]  metoprolol succinate (TOPROL-XL) 25 MG 24 hr tablet TAKE 1 TABLET BY MOUTH EVERY DAY Patient taking differently: Take 25 mg by mouth daily.  07/25/18  Yes Marrian Salvage, FNP  mometasone (ELOCON) 0.1 % cream Apply 1 application topically 2 (two) times daily. Patient taking differently: Apply 1 application topically 2 (two) times daily. Shoulder rash 01/15/18  Yes Marrian Salvage, FNP  omeprazole (PRILOSEC) 20 MG capsule Take 20 mg by mouth daily as needed (acid reflux).   Yes [provider]  OVER THE COUNTER MEDICATION Apply 1 application topically daily as needed (muscle pain). Pt  states he uses an otc muscle rub that has a horse on the package   Yes [provider]  azithromycin (ZITHROMAX) 250 MG tablet Take 250 mg by mouth daily.    [provider]  colchicine 0.6 MG tablet Take 2 tablets at the onset of a flare and 1 tablet 1 hour later. Repeat in 72 hours if needed. Patient not taking: Reported on 08/07/2018 03/12/17   Golden Circle, FNP  HYDROcodone-acetaminophen (NORCO) 5-325 MG tablet Take 1 tablet by mouth every 4 (four)  hours as needed for severe pain. 08/07/18   Sherwood Gambler, MD  Spacer/Aero-Holding Chambers (AEROCHAMBER MV) inhaler Use as instructed 08/21/17   Deneise Lever, MD    Family History Family History  Problem Relation Age of Onset  . Arthritis Mother   . Hyperlipidemia Mother   . Hypertension Mother   . Diabetes Mother   . Stomach cancer Mother        possible, not 100 % sure  . Kidney disease Mother   . Arthritis Father   . Hypertension Father   . Diabetes Father   . Asthma Maternal Grandmother   . Rectal cancer Neg Hx   . Colon cancer Neg Hx     Social History Social History   Tobacco Use  . Smoking status: Never Smoker  . Smokeless tobacco: Never Used  Substance Use Topics  . Alcohol use: No  . Drug use: No     Allergies   Ibuprofen   Review of Systems Review of Systems  Constitutional: Negative for fever.  Respiratory: Negative for shortness of breath.   Cardiovascular: Positive for leg swelling. Negative for chest pain.  Gastrointestinal: Negative for vomiting.  Musculoskeletal: Positive for arthralgias.  Neurological: Negative for weakness and numbness.  All other systems reviewed and are negative.    Physical Exam Updated Vital Signs BP 131/85 (BP Location: Left Arm)   Pulse 82   Temp 97.7 F (36.5 C) (Oral)   Resp 14   Ht 5\' 11"  (1.803 m)   Wt 111 kg   SpO2 99%   BMI 34.13 kg/m   Physical Exam Vitals signs and nursing note reviewed.  Constitutional:      Appearance: He is well-developed. He is obese.  HENT:     Head: Normocephalic and atraumatic.     Right Ear: External ear normal.     Left Ear: External ear normal.     Nose: Nose normal.  Eyes:     General:        Right eye: No discharge.        Left eye: No discharge.  Neck:     Musculoskeletal: Neck supple.  Cardiovascular:     Rate and Rhythm: Normal rate and regular rhythm.     Pulses:          Dorsalis pedis pulses are 2+ on the right side.     Heart sounds: Normal heart  sounds.  Pulmonary:     Effort: Pulmonary effort is normal.     Breath sounds: Normal breath sounds.  Abdominal:     Palpations: Abdomen is soft.     Tenderness: There is no abdominal tenderness.  Musculoskeletal:     Right knee: He exhibits decreased range of motion, swelling and effusion. He exhibits no erythema. Tenderness found.     Right lower leg: He exhibits no swelling.     Comments: Normal strength/sensation in right foot Right knee is warm compared to left, but no  erythema.  Skin:    General: Skin is warm and dry.  Neurological:     Mental Status: He is alert.  Psychiatric:        Mood and Affect: Mood is not anxious.      ED Treatments / Results  Labs (all labs ordered are listed, but only abnormal results are displayed) Labs Reviewed  BASIC METABOLIC PANEL - Abnormal; Notable for the following components:      Result Value   Glucose, Bld 118 (*)    All other components within normal limits  CBC WITH DIFFERENTIAL/PLATELET - Abnormal; Notable for the following components:   MCH 24.3 (*)    RDW 15.9 (*)    Platelets 136 (*)    All other components within normal limits  SYNOVIAL CELL COUNT + DIFF, W/ CRYSTALS - Abnormal; Notable for the following components:   Appearance-Synovial TURBID (*)    WBC, Synovial 7,700 (*)    Neutrophil, Synovial 75 (*)    Monocyte-Macrophage-Synovial Fluid 24 (*)    All other components within normal limits  BODY FLUID CULTURE  GLUCOSE, BODY FLUID OTHER  PROTEIN, BODY FLUID (OTHER)    EKG None  Radiology Dg Knee Complete 4 Views Right  Result Date: 08/07/2018 CLINICAL DATA:  Generalized right knee pain without injury. History of gout. EXAM: RIGHT KNEE - COMPLETE 4+ VIEW COMPARISON:  07/07/2014 FINDINGS: No evidence of fracture or dislocation. Moderate to large suprapatellar joint effusion. Mild osteoarthritic changes of all 3 compartments of the knee. Soft tissues are normal. IMPRESSION: Moderate large suprapatellar  joint effusion. Mild 3 compartment osteoarthritic changes of the right knee. Electronically Signed   By: Fidela Salisbury M.D.   On: 08/07/2018 12:46    Procedures .Joint Aspiration/Arthrocentesis Date/Time: 08/07/2018 1:59 PM Performed by: Sherwood Gambler, MD Authorized by: Sherwood Gambler, MD   Consent:    Consent obtained:  Verbal and written   Consent given by:  Guardian   Risks discussed:  Bleeding, incomplete drainage, nerve damage, infection and pain Location:    Location:  Knee   Knee:  R knee Anesthesia (see MAR for exact dosages):    Anesthesia method:  Local infiltration   Local anesthetic:  Lidocaine 2% WITH epi Procedure details:    Preparation: Patient was prepped and draped in usual sterile fashion     Needle gauge:  18 G   Approach:  Lateral   Aspirate amount:  45 mL   Aspirate characteristics:  Yellow Post-procedure details:    Dressing:  Adhesive bandage   Patient tolerance of procedure:  Tolerated well, no immediate complications   (including critical care time)  Medications Ordered in ED Medications  HYDROcodone-acetaminophen (NORCO/VICODIN) 5-325 MG per tablet 2 tablet (has no administration in time range)  acetaminophen (TYLENOL) tablet 650 mg (650 mg Oral Given 08/07/18 1150)  HYDROmorphone (DILAUDID) injection 1 mg (1 mg Intravenous Given 08/07/18 1332)  lidocaine-EPINEPHrine (XYLOCAINE W/EPI) 2 %-1:200000 (PF) injection 10 mL (10 mLs Infiltration Given by Other 08/07/18 1353)     Initial Impression / Assessment and Plan / ED Course  I have reviewed the triage vital signs and the nursing notes.  Pertinent labs & imaging results that were available during my care of the patient were reviewed by me and considered in my medical decision making (see chart for details).     There are no crystals in the joint fluid but also not enough WBCs to be highly concern for septic joint.  His WBC and is blood is normal  and his temperature is normal.  My suspicion  for septic joint is fairly low nothing with negative Gram stain, we can wait for culture and treat him symptomatically.  Unfortunately cannot take NSAIDs so he will be treated with knee sleeve, hydrocodone for breakthrough pain and Tylenol.  Discussed following up with orthopedics and we discussed return precautions.  Final Clinical Impressions(s) / ED Diagnoses   Final diagnoses:  Effusion of right knee joint  Arthritis of right knee    ED Discharge Orders         Ordered    HYDROcodone-acetaminophen (NORCO) 5-325 MG tablet  Every 4 hours PRN     08/07/18 1613           Sherwood Gambler, MD 08/07/18 [phone]

## 2018-08-07 NOTE — ED Triage Notes (Signed)
Patient arrived by EMS from home. Pt c/o RT leg pain.   Pt had hx of asthma, HTN, Gout, and Altered mental status. '  BP 150/90, HR 92, Spo2 99% on RA.  Pts sister in law and brother take care of him per EMS.  Pt stated he's on Zithromax for infection in colon.

## 2018-08-07 NOTE — ED Notes (Signed)
Knee sleeve has been applied to patient's RT knee.

## 2018-08-07 NOTE — ED Notes (Addendum)
Signed informed consent placed at bedside.

## 2018-08-07 NOTE — ED Notes (Signed)
Patients sister in law was given discharge teaching and verbalized understanding. Patient was taken out of ED with wheelchair by ED staff.

## 2018-08-07 NOTE — ED Notes (Signed)
Patient stated that his knee pain has occurred before.

## 2018-08-08 ENCOUNTER — Ambulatory Visit: Payer: Medicare Other | Admitting: Family Medicine

## 2018-08-08 DIAGNOSIS — Z0289 Encounter for other administrative examinations: Secondary | ICD-10-CM

## 2018-08-08 LAB — GLUCOSE, BODY FLUID OTHER: Glucose, Body Fluid Other: 95 mg/dL

## 2018-08-08 LAB — PROTEIN, BODY FLUID (OTHER): Total Protein, Body Fluid Other: 3.8 g/dL

## 2018-08-11 LAB — BODY FLUID CULTURE: Culture: NO GROWTH

## 2018-12-16 ENCOUNTER — Telehealth: Payer: Self-pay

## 2018-12-16 NOTE — Telephone Encounter (Signed)
I called and left message on voicemail for Mrs. Vanderlinden to call back and schedule appointment for patient. He will need to make sure someone is able to accompany him to the appointment as well per Mickel Baas.

## 2018-12-24 ENCOUNTER — Other Ambulatory Visit: Payer: Self-pay

## 2018-12-24 ENCOUNTER — Other Ambulatory Visit: Payer: Self-pay | Admitting: Family

## 2018-12-24 ENCOUNTER — Ambulatory Visit (INDEPENDENT_AMBULATORY_CARE_PROVIDER_SITE_OTHER): Payer: Medicare Other | Admitting: Family

## 2018-12-24 ENCOUNTER — Encounter: Payer: Self-pay | Admitting: Family

## 2018-12-24 VITALS — BP 132/76 | HR 80 | Temp 97.8°F | Ht 71.0 in | Wt 261.8 lb

## 2018-12-24 DIAGNOSIS — I10 Essential (primary) hypertension: Secondary | ICD-10-CM

## 2018-12-24 DIAGNOSIS — J4530 Mild persistent asthma, uncomplicated: Secondary | ICD-10-CM

## 2018-12-24 DIAGNOSIS — K219 Gastro-esophageal reflux disease without esophagitis: Secondary | ICD-10-CM

## 2018-12-24 MED ORDER — OMEPRAZOLE 20 MG PO CPDR: 20.0000 mg | DELAYED_RELEASE_CAPSULE | Freq: Every day | ORAL | 3 refills | Status: DC | PRN

## 2018-12-24 MED ORDER — METOPROLOL SUCCINATE ER 25 MG PO TB24: 25.0000 mg | ORAL_TABLET | Freq: Every day | ORAL | 3 refills | Status: DC

## 2018-12-24 MED ORDER — FLUTICASONE PROPIONATE 50 MCG/ACT NA SUSP: NASAL | 11 refills | Status: DC

## 2018-12-24 MED ORDER — BUDESONIDE-FORMOTEROL FUMARATE 160-4.5 MCG/ACT IN AERO: 2.0000 | INHALATION_SPRAY | Freq: Two times a day (BID) | RESPIRATORY_TRACT | 6 refills | Status: DC

## 2018-12-24 MED ORDER — ALBUTEROL SULFATE HFA 108 (90 BASE) MCG/ACT IN AERS: 2.0000 | INHALATION_SPRAY | RESPIRATORY_TRACT | 2 refills | Status: DC | PRN

## 2018-12-24 NOTE — Progress Notes (Signed)
Roberto Horne is a 58 y.o. male with the following history as recorded in EpicCare:  Patient Active Problem List   Diagnosis Date Noted  . Acute pain of left knee 07/28/2018  . Intellectual disability 12/03/2016  . Dental caries 12/03/2016  . Obesity 10/01/2016  . Left foot pain 08/30/2015  . Acute upper respiratory infection 08/30/2015  . Nasal polyps 07/21/2015  . Contusion, arm, upper 03/01/2015  . Hand contusion 03/01/2015  . Obstructive sleep apnea 12/16/2014  . Seasonal and perennial allergic rhinitis 09/21/2014  . Medicare annual wellness visit, subsequent 09/07/2014  . CAP (community acquired pneumonia) 07/07/2014  . Effusion of right knee 07/07/2014  . Hypertension 07/07/2014  . Abdominal pain 07/07/2014  . Pneumonia 07/07/2014  . Asthma, mild persistent 12/15/2012  . Gout, unspecified 12/15/2012  . Esophageal reflux 12/15/2012  . HTN (hypertension) 11/06/2012  . Hyperlipidemia 11/06/2012    Current Outpatient Medications  Medication Sig Dispense Refill  . Spacer/Aero-Holding Chambers (AEROCHAMBER MV) inhaler Use as instructed 1 each 0  . albuterol (VENTOLIN HFA) 108 (90 Base) MCG/ACT inhaler Inhale 2 puffs into the lungs every 4 (four) hours as needed for wheezing or shortness of breath. 6.7 g 2  . budesonide-formoterol (SYMBICORT) 160-4.5 MCG/ACT inhaler Inhale 2 puffs into the lungs 2 (two) times daily. 1 Inhaler 6  . colchicine 0.6 MG tablet Take 2 tablets at the onset of a flare and 1 tablet 1 hour later. Repeat in 72 hours if needed. 6 tablet 1  . fluticasone (FLONASE) 50 MCG/ACT nasal spray PLACE 1 SPRAY INTO BOTH NOSTRILS DAILY. 16 g 11  . metoprolol succinate (TOPROL-XL) 25 MG 24 hr tablet Take 1 tablet (25 mg total) by mouth daily. 90 tablet 3  . mometasone (ELOCON) 0.1 % cream Apply 1 application topically 2 (two) times daily. (Patient not taking: Reported on 12/24/2018) 45 g 0  . omeprazole (PRILOSEC) 20 MG capsule Take 1 capsule (20 mg total) by mouth daily  as needed (acid reflux). 90 capsule 3  . OVER THE COUNTER MEDICATION Apply 1 application topically daily as needed (muscle pain). Pt states he uses an otc muscle rub that has a horse on the package     No current facility-administered medications for this visit.     Allergies: Ibuprofen  Past Medical History:  Diagnosis Date  . Allergy   . Arthritis   . Asthma   . Depression   . GERD (gastroesophageal reflux disease)   . Gout   . Hyperlipemia   . Hypertension   . Mental impairment   . Pneumonia   . Sleep apnea     Past Surgical History:  Procedure Laterality Date  . BRAIN SURGERY    . FUNCTIONAL ENDOSCOPIC SINUS SURGERY  10/14/2015  . SINUS ENDO W/FUSION Bilateral 10/14/2015   Procedure: ENDOSCOPIC SINUS SURGERY WITH NAVIGATION;  Surgeon: Melida Quitter, MD;  Location: [facility] OR;  Service: ENT;  Laterality: Bilateral;    Family History  Problem Relation Age of Onset  . Arthritis Mother   . Hyperlipidemia Mother   . Hypertension Mother   . Diabetes Mother   . Stomach cancer Mother        possible, not 100 % sure  . Kidney disease Mother   . Arthritis Father   . Hypertension Father   . Diabetes Father   . Asthma Maternal Grandmother   . Rectal cancer Neg Hx   . Colon cancer Neg Hx     Social History   Tobacco Use  .  Smoking status: Never Smoker  . Smokeless tobacco: Never Used  Substance Use Topics  . Alcohol use: No    Subjective:  Patient is accompanied by his sister-in-law; needs FL2 completed as he will be moving into assisted living facility; History of hypertension, asthma; unclear if he is taking his medications regularly; Denies any chest pain, shortness of breath, blurred vision or headache.   Objective:  Vitals:   12/24/18 1200  BP: 132/76  Pulse: 80  Temp: 97.8 F (36.6 C)  TempSrc: Oral  SpO2: 95%  Weight: 261 lb 12.8 oz (118.8 kg)  Height: 5\' 11"  (1.803 m)    General: Well developed, well nourished, in no acute distress  Skin : Warm and dry.   Head: Normocephalic and atraumatic  Lungs: Respirations unlabored; clear to auscultation bilaterally without wheeze, rales, rhonchi  CVS exam: normal rate and regular rhythm.  Neurologic: Alert and oriented; speech intact; face symmetrical; moves all extremities well; CNII-XII intact without focal deficit   Assessment:  1. Essential hypertension   2. Mild persistent asthma without complication   3. Gastroesophageal reflux disease, esophagitis presence not specified     Plan:  Refills updated; FL2 completed as requested;  Stressed need to take medication daily due to suspected non-compliance with medications.   No follow-ups on file.  No orders of the defined types were placed in this encounter.   Requested Prescriptions    No prescriptions requested or ordered in this encounter

## 2018-12-24 NOTE — Telephone Encounter (Signed)
Patient came in today

## 2019-04-06 IMAGING — CR DG CHEST 2V
2 series · 2 of 2 positions shown · non-contrast
Comparison: PA and lateral chest x-ray July 11, 2014

CLINICAL DATA: Shortness of breath and wheezing without chest pain.
The patient reports some cough. History of asthma.

EXAM:
CHEST  2 VIEW

[w chest pa]
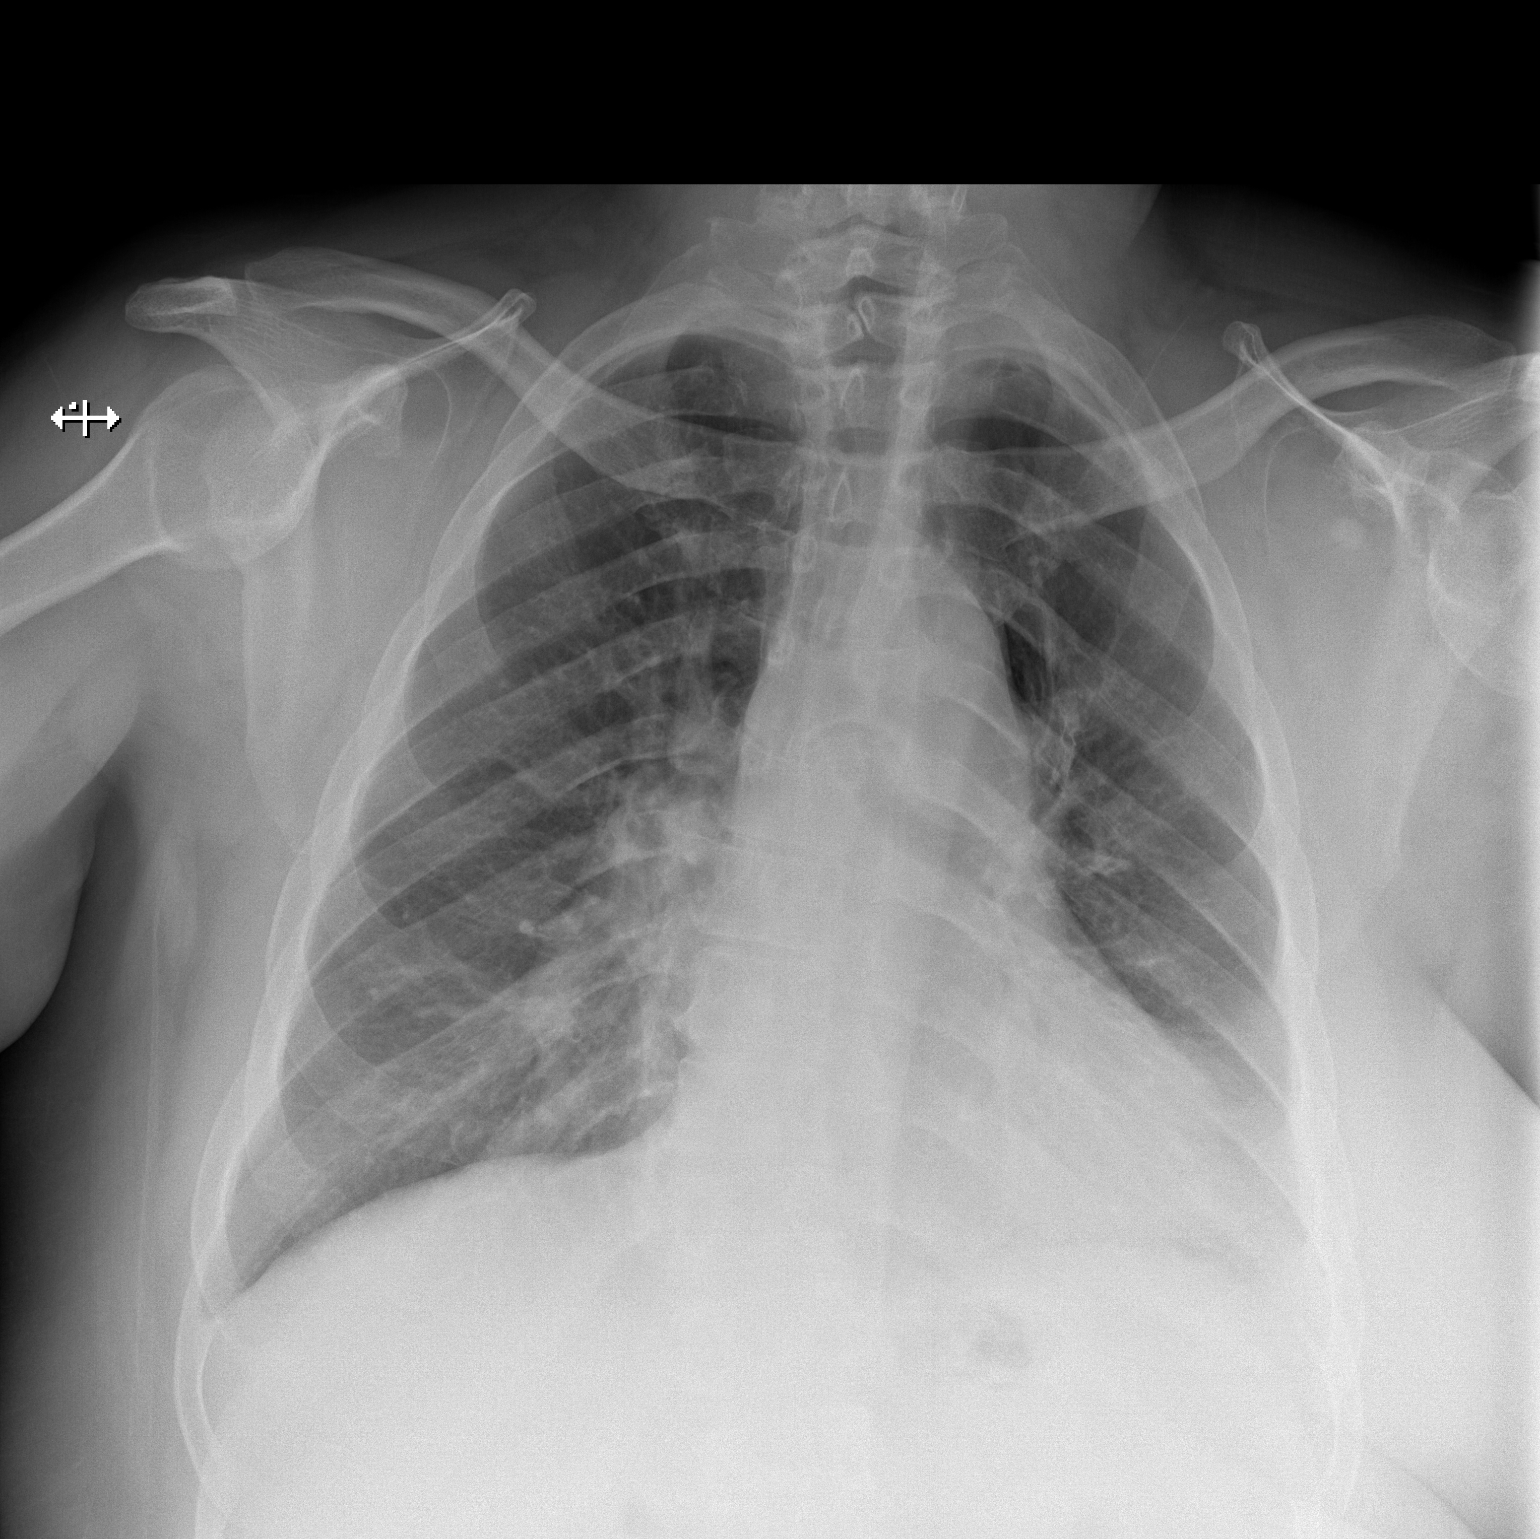

[w chest lat]
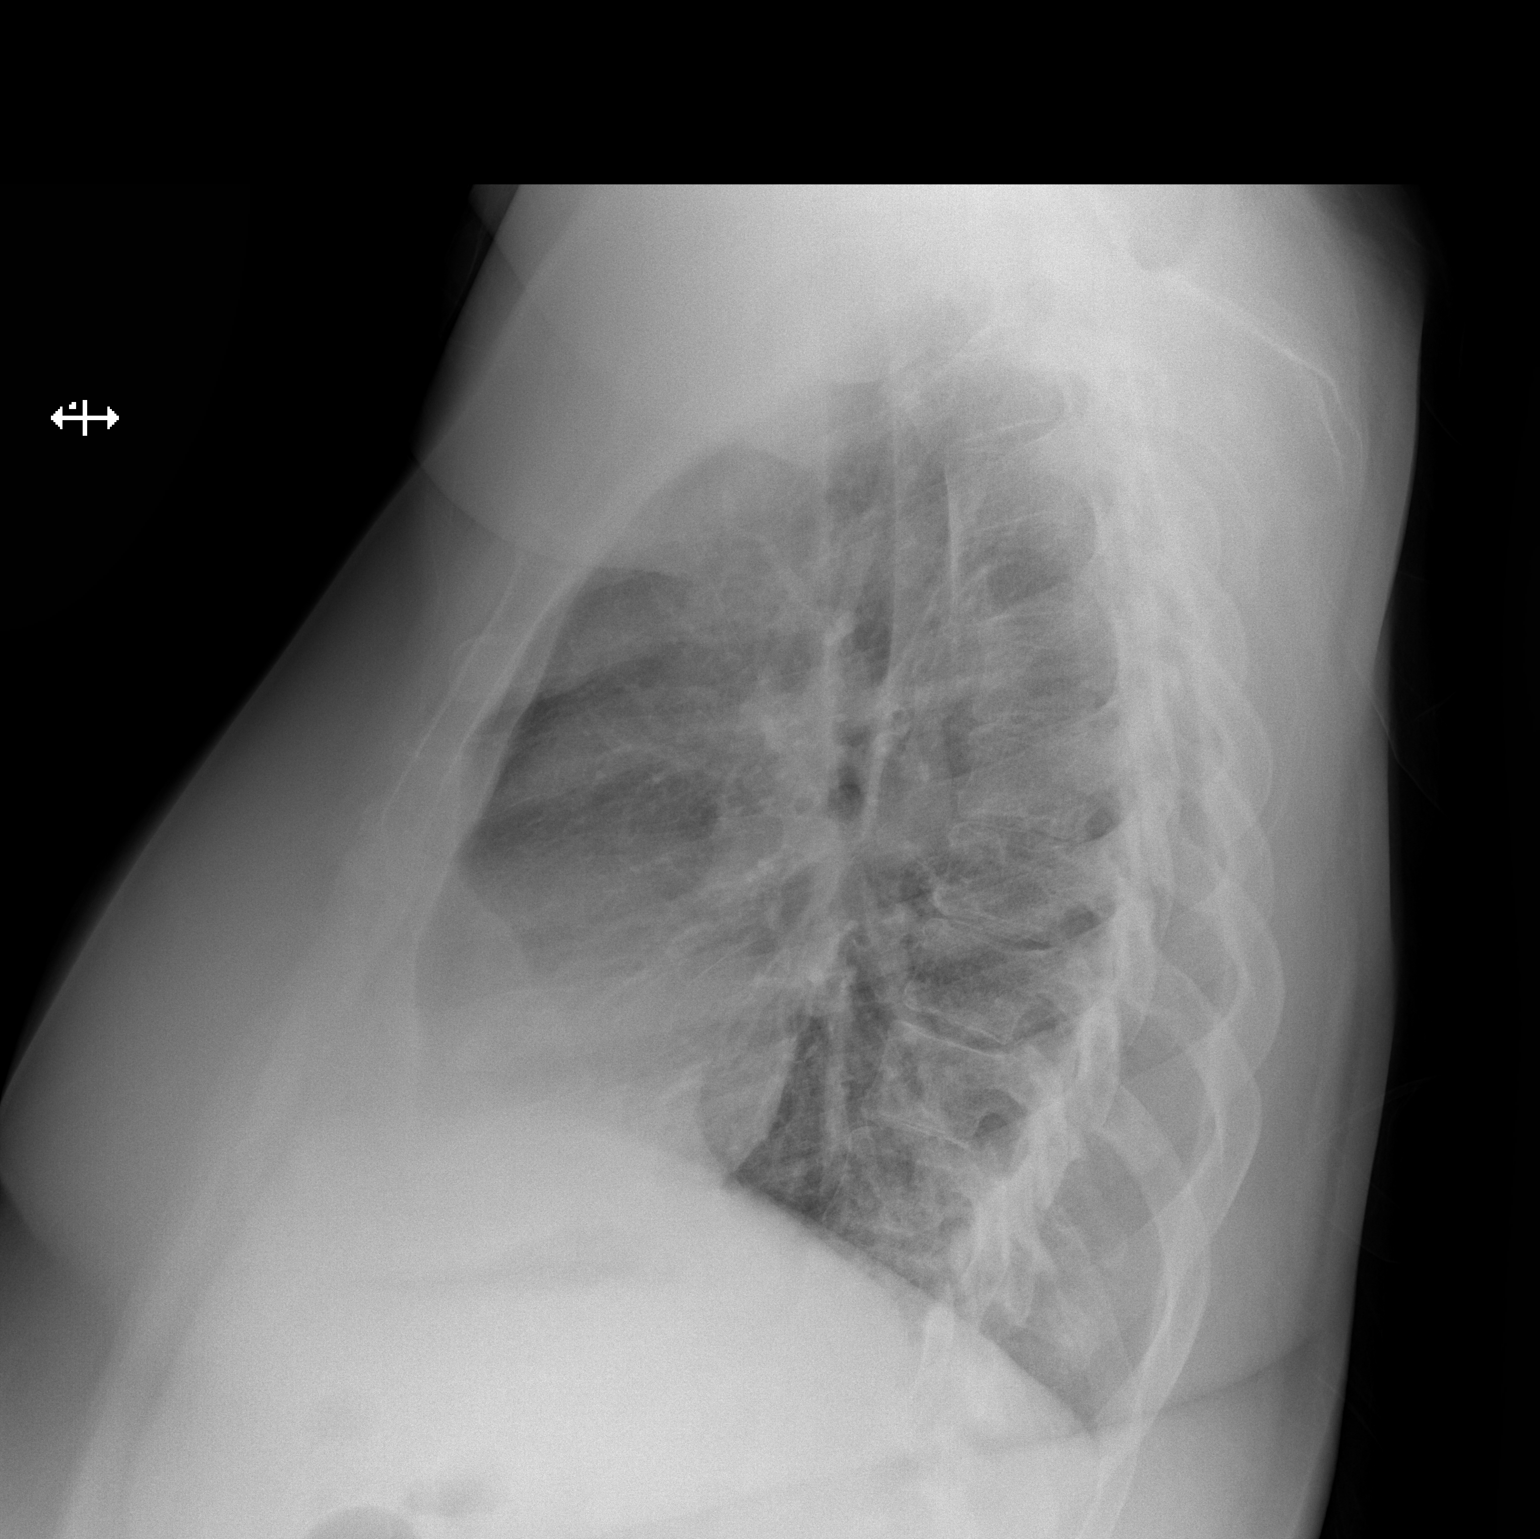

[2 of 2 positions shown; findings below may reference images not displayed]

FINDINGS: The lungs are adequately inflated. The interstitial markings are
mildly increased. There is infiltrate in the left lower lobe
posteriorly. The heart and pulmonary vascularity are normal. The
mediastinum is normal in width.
IMPRESSION: Left lower lobe atelectasis or pneumonia. Chronic changes of
reactive airway disease. Followup PA and lateral chest X-ray is
recommended in 3-4 weeks following trial of antibiotic therapy to
ensure resolution and exclude underlying malignancy.

## 2019-05-11 IMAGING — DX DG CHEST 2V
2 series · 3 of 3 positions shown · non-contrast
Comparison: 04/18/2017

CLINICAL DATA: Dyspnea, asthma flare up since [REDACTED].

EXAM:
CHEST  2 VIEW

[chest pa]
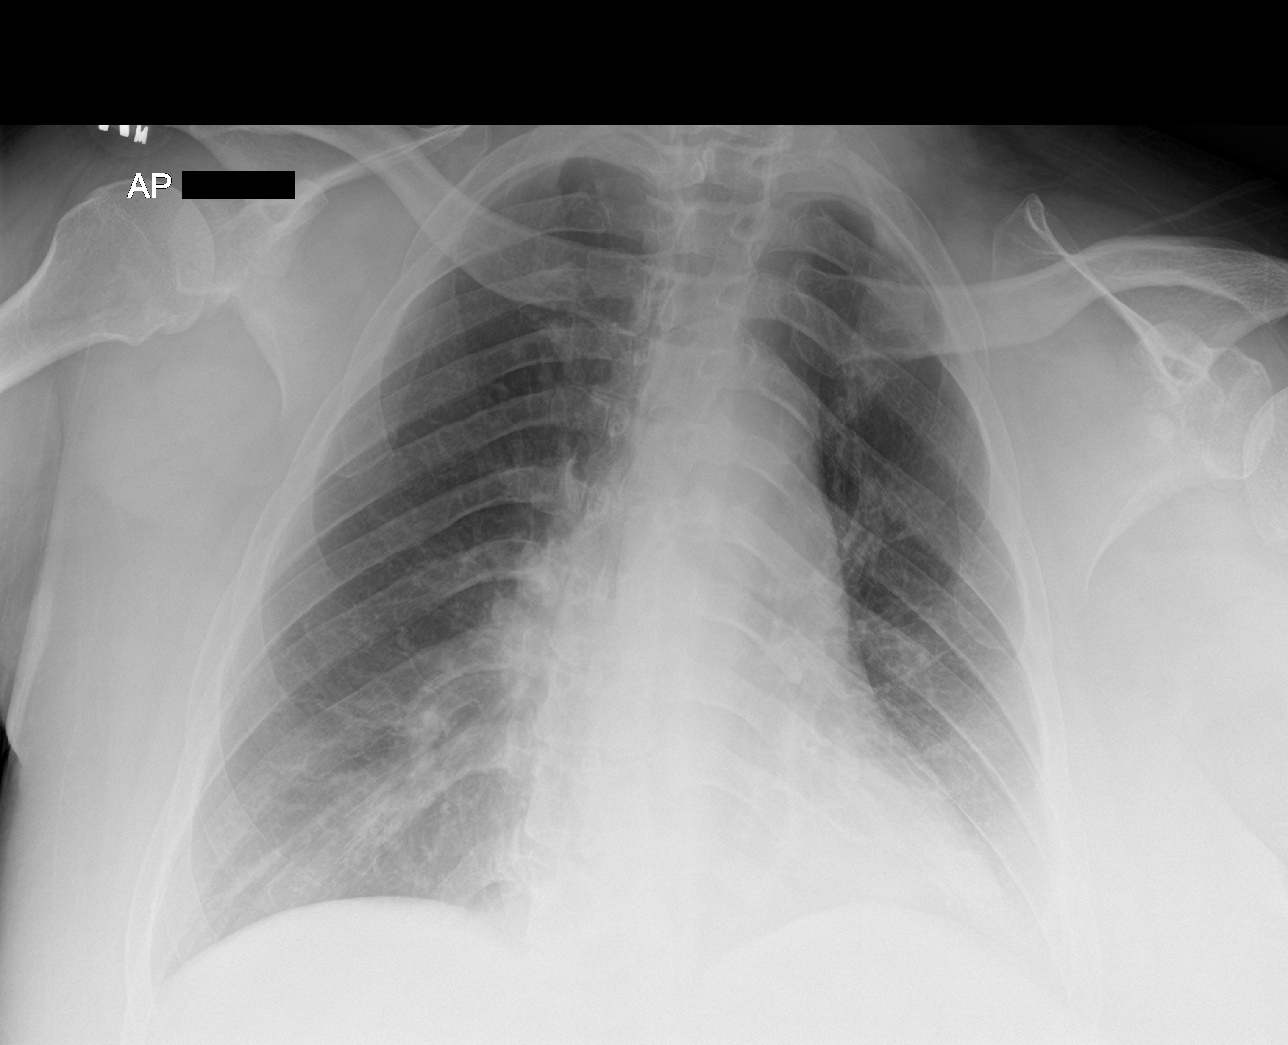

[Series 2: chest lat · 0.14mm/px · 2 of 2 slices shown]
[im 1/2]
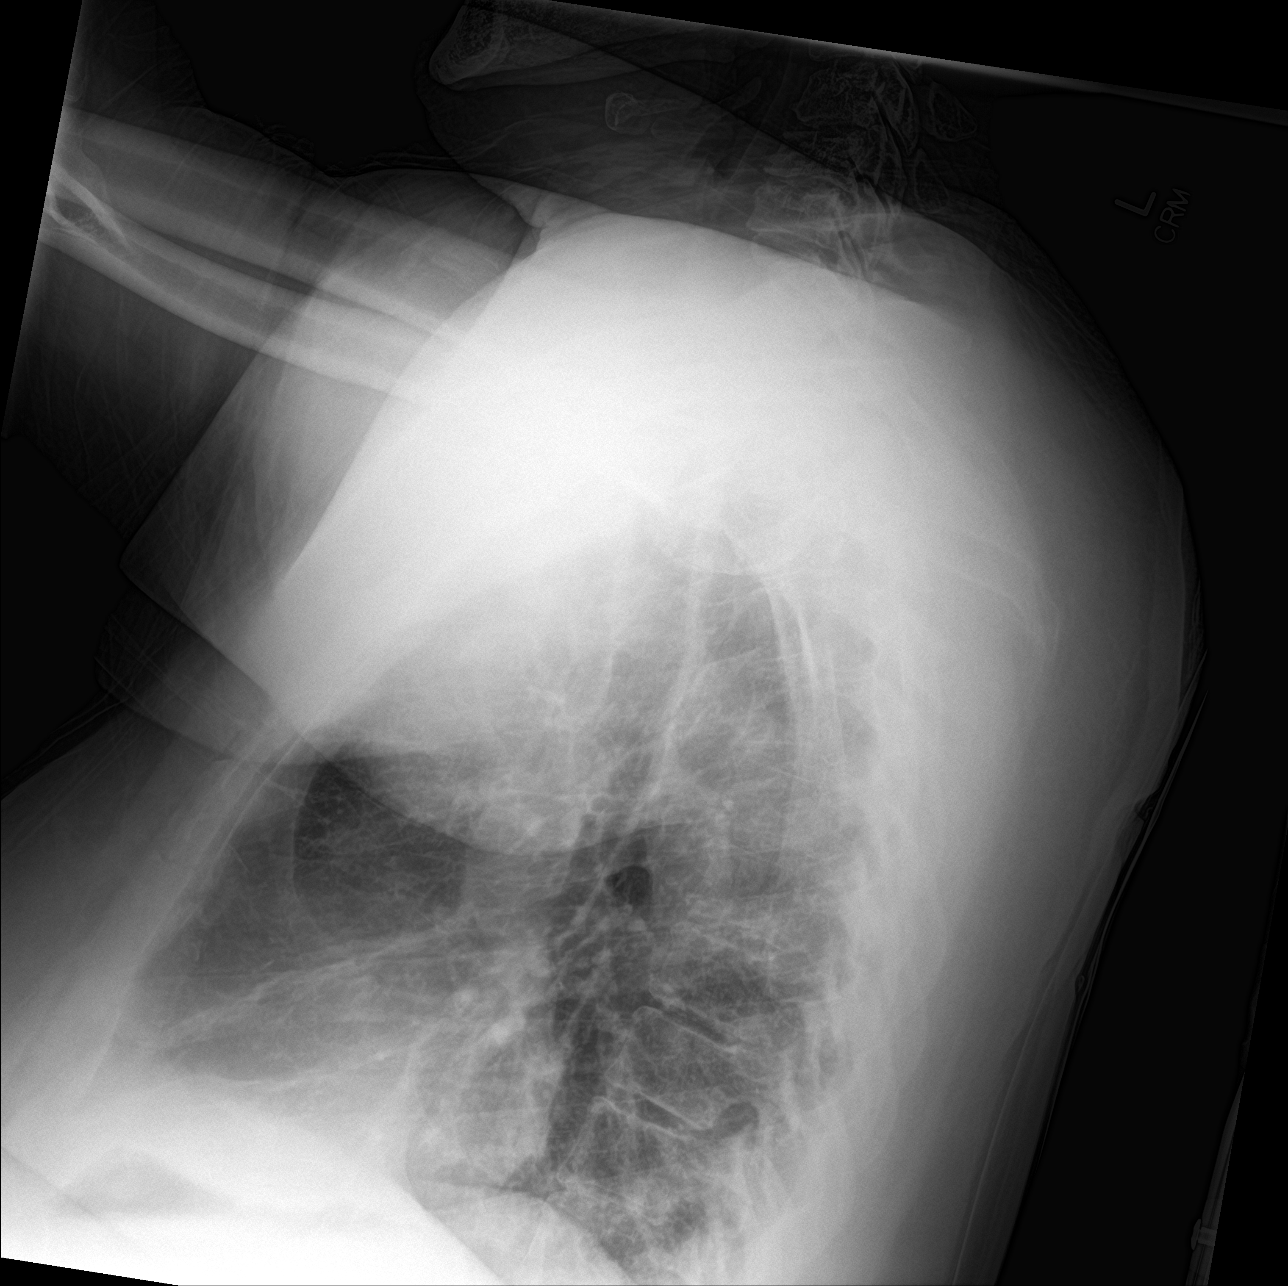
[im 2/2]
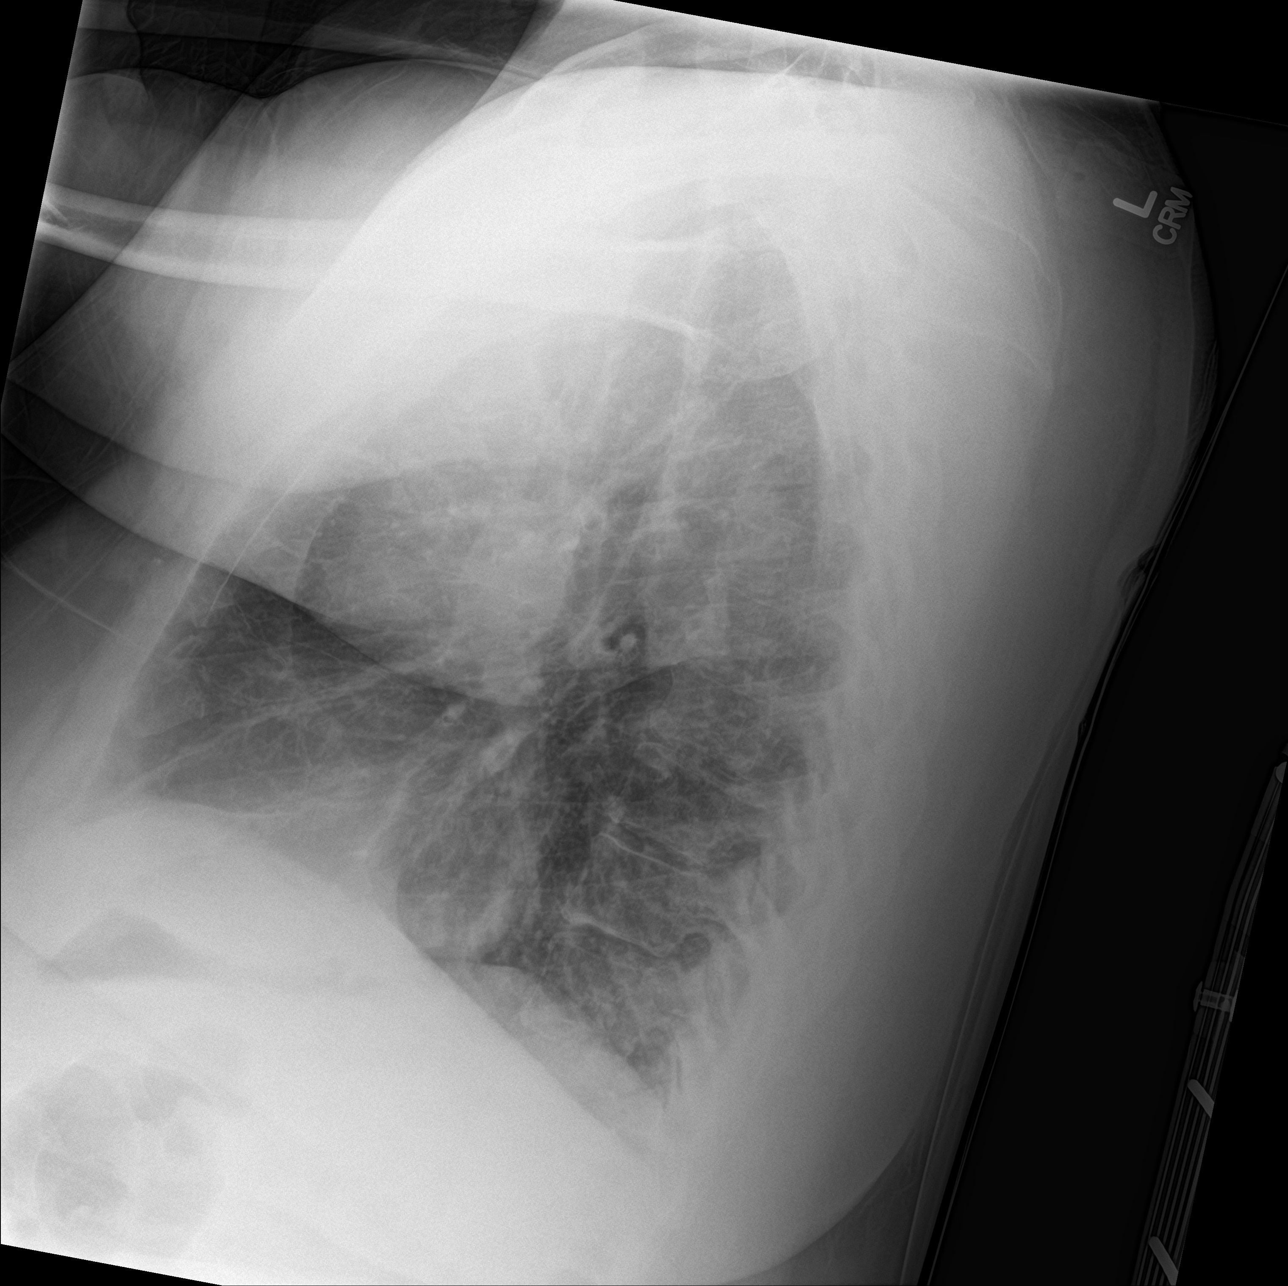

[3 of 3 positions shown; findings below may reference images not displayed]

FINDINGS: Heart is top normal with minimal aortic atherosclerosis. Chronic
mild interstitial lung markings consistent chronic bronchitic
change. Lungs are hyperinflated with atelectasis at the lung bases.
No significant pleural effusion. No pneumothorax or overt pulmonary
edema. No acute nor suspicious osseous abnormality.
IMPRESSION: 1. Mild pulmonary hyperinflation with minimal atelectasis at the
lung bases.
2. Chronic stable bronchitic change of the lungs.

## 2019-08-03 ENCOUNTER — Other Ambulatory Visit: Payer: Self-pay | Admitting: Family

## 2020-01-18 ENCOUNTER — Encounter: Payer: Self-pay | Admitting: Family

## 2020-01-18 ENCOUNTER — Other Ambulatory Visit: Payer: Self-pay

## 2020-01-18 ENCOUNTER — Ambulatory Visit (INDEPENDENT_AMBULATORY_CARE_PROVIDER_SITE_OTHER): Payer: Medicare Other | Admitting: Family

## 2020-01-18 VITALS — BP 142/90 | HR 82 | Temp 98.2°F | Ht 71.0 in | Wt 293.2 lb

## 2020-01-18 DIAGNOSIS — Z13220 Encounter for screening for lipoid disorders: Secondary | ICD-10-CM | POA: Diagnosis not present

## 2020-01-18 DIAGNOSIS — Z125 Encounter for screening for malignant neoplasm of prostate: Secondary | ICD-10-CM

## 2020-01-18 DIAGNOSIS — Z0000 Encounter for general adult medical examination without abnormal findings: Secondary | ICD-10-CM

## 2020-01-18 DIAGNOSIS — M109 Gout, unspecified: Secondary | ICD-10-CM

## 2020-01-18 DIAGNOSIS — R001 Bradycardia, unspecified: Secondary | ICD-10-CM

## 2020-01-18 LAB — PSA, MEDICARE: PSA: 0.64 ng/ml (ref 0.10–4.00)

## 2020-01-18 MED ORDER — ALBUTEROL SULFATE HFA 108 (90 BASE) MCG/ACT IN AERS: 2.0000 | INHALATION_SPRAY | RESPIRATORY_TRACT | 2 refills | Status: DC | PRN

## 2020-01-18 MED ORDER — METOPROLOL SUCCINATE ER 25 MG PO TB24: 25.0000 mg | ORAL_TABLET | Freq: Every day | ORAL | 3 refills | Status: DC

## 2020-01-18 MED ORDER — BUDESONIDE-FORMOTEROL FUMARATE 160-4.5 MCG/ACT IN AERO: INHALATION_SPRAY | RESPIRATORY_TRACT | 3 refills | Status: DC

## 2020-01-18 NOTE — Progress Notes (Signed)
Roberto Horne is a 59 y.o. male with the following history as recorded in EpicCare:  Patient Active Problem List   Diagnosis Date Noted  . Acute pain of left knee 07/28/2018  . Intellectual disability 12/03/2016  . Dental caries 12/03/2016  . Obesity 10/01/2016  . Left foot pain 08/30/2015  . Acute upper respiratory infection 08/30/2015  . Nasal polyps 07/21/2015  . Contusion, arm, upper 03/01/2015  . Hand contusion 03/01/2015  . Obstructive sleep apnea 12/16/2014  . Seasonal and perennial allergic rhinitis 09/21/2014  . Medicare annual wellness visit, subsequent 09/07/2014  . CAP (community acquired pneumonia) 07/07/2014  . Effusion of right knee 07/07/2014  . Hypertension 07/07/2014  . Abdominal pain 07/07/2014  . Pneumonia 07/07/2014  . Asthma, mild persistent 12/15/2012  . Gout, unspecified 12/15/2012  . Esophageal reflux 12/15/2012  . HTN (hypertension) 11/06/2012  . Hyperlipidemia 11/06/2012    Current Outpatient Medications  Medication Sig Dispense Refill  . albuterol (VENTOLIN HFA) 108 (90 Base) MCG/ACT inhaler Inhale 2 puffs into the lungs every 4 (four) hours as needed for wheezing or shortness of breath. 6.7 g 2  . fluticasone (FLONASE) 50 MCG/ACT nasal spray PLACE 1 SPRAY INTO BOTH NOSTRILS DAILY. 16 g 11  . mometasone (ELOCON) 0.1 % cream Apply 1 application topically 2 (two) times daily. 45 g 0  . OVER THE COUNTER MEDICATION Apply 1 application topically daily as needed (muscle pain). Pt states he uses an otc muscle rub that has a horse on the package    . Spacer/Aero-Holding Chambers (AEROCHAMBER MV) inhaler Use as instructed 1 each 0  . budesonide-formoterol (SYMBICORT) 160-4.5 MCG/ACT inhaler TAKE 2 PUFFS BY MOUTH TWICE A DAY 30.6 Inhaler 3  . metoprolol succinate (TOPROL-XL) 25 MG 24 hr tablet Take 1 tablet (25 mg total) by mouth daily. 90 tablet 3   No current facility-administered medications for this visit.    Allergies: Ibuprofen  Past Medical  History:  Diagnosis Date  . Allergy   . Arthritis   . Asthma   . Depression   . GERD (gastroesophageal reflux disease)   . Gout   . Hyperlipemia   . Hypertension   . Mental impairment   . Pneumonia   . Sleep apnea     Past Surgical History:  Procedure Laterality Date  . BRAIN SURGERY    . FUNCTIONAL ENDOSCOPIC SINUS SURGERY  10/14/2015  . SINUS ENDO W/FUSION Bilateral 10/14/2015   Procedure: ENDOSCOPIC SINUS SURGERY WITH NAVIGATION;  Surgeon: Melida Quitter, MD;  Location: [facility] OR;  Service: ENT;  Laterality: Bilateral;    Family History  Problem Relation Age of Onset  . Arthritis Mother   . Hyperlipidemia Mother   . Hypertension Mother   . Diabetes Mother   . Stomach cancer Mother        possible, not 100 % sure  . Kidney disease Mother   . Arthritis Father   . Hypertension Father   . Diabetes Father   . Asthma Maternal Grandmother   . Rectal cancer Neg Hx   . Colon cancer Neg Hx     Social History   Tobacco Use  . Smoking status: Never Smoker  . Smokeless tobacco: Never Used  Substance Use Topics  . Alcohol use: No    Subjective:  Presents for yearly CPE; accompanied by his caregiver; readily admits that has not been taking any of his medications; has gained 30+ pounds since OV last year- not walking or exercising/ "just laying around the house."  Review of Systems  Constitutional: Negative.   HENT: Negative.   Eyes: Negative.   Respiratory: Negative.   Cardiovascular: Negative.   Gastrointestinal: Negative.   Genitourinary: Negative.   Musculoskeletal: Negative.   Skin: Negative.   Neurological: Negative.   Endo/Heme/Allergies: Negative.   Psychiatric/Behavioral: Negative.      Objective:  Vitals:   01/18/20 1517  BP: (!) 142/90  Pulse: 82  Temp: 98.2 F (36.8 C)  TempSrc: Oral  SpO2: 95%  Weight: (!) 293 lb 3.2 oz (133 kg)  Height: '5\' 11"'  (1.803 m)    General: Well developed, well nourished, in no acute distress  Skin : Warm and dry.  Head:  Normocephalic and atraumatic  Eyes: Sclera and conjunctiva clear; pupils round and reactive to light; extraocular movements intact  Ears: External normal; canals clear; tympanic membranes normal  Oropharynx: Pink, supple. No suspicious lesions  Neck: Supple without thyromegaly, adenopathy  Lungs: Respirations unlabored; clear to auscultation bilaterally without wheeze, rales, rhonchi  CVS exam: normal rate and regular rhythm.  Abdomen: Soft; nontender; nondistended; normoactive bowel sounds; no masses or hepatosplenomegaly  Musculoskeletal: No deformities; no active joint inflammation  Extremities: No edema, cyanosis, clubbing  Vessels: Symmetric bilaterally  Neurologic: Alert and oriented; speech intact; face symmetrical; moves all extremities well; CNII-XII intact without focal deficit  Assessment:  1. PE (physical exam), annual   2. Lipid screening   3. Prostate cancer screening   4. Gout, unspecified cause, unspecified chronicity, unspecified site   5. Bradycardia     Plan:  Age appropriate preventive healthcare needs addressed; encouraged regular eye doctor and dental exams; encouraged regular exercise and weight loss; he needs to find some way to set daily reminder to take his medications- discussed setting an alarm on the phone;  will update labs and refills as needed today; follow-up to be determined;  This visit occurred during the SARS-CoV-2 public health emergency.  Safety protocols were in place, including screening questions prior to the visit, additional usage of staff PPE, and extensive cleaning of exam room while observing appropriate contact time as indicated for disinfecting solutions.      No follow-ups on file.  Orders Placed This Encounter  Procedures  . CBC with Differential/Platelet    Standing Status:   Future    Number of Occurrences:   1    Standing Expiration Date:   01/17/2021  . Comp Met (CMET)    Standing Status:   Future    Number of Occurrences:   1     Standing Expiration Date:   01/17/2021  . Lipid panel    Standing Status:   Future    Number of Occurrences:   1    Standing Expiration Date:   01/17/2021  . PSA, Medicare    Standing Status:   Future    Number of Occurrences:   1    Standing Expiration Date:   01/17/2021  . Uric acid    Standing Status:   Future    Number of Occurrences:   1    Standing Expiration Date:   01/17/2021    Requested Prescriptions   Signed Prescriptions Disp Refills  . budesonide-formoterol (SYMBICORT) 160-4.5 MCG/ACT inhaler 30.6 Inhaler 3    Sig: TAKE 2 PUFFS BY MOUTH TWICE A DAY  . albuterol (VENTOLIN HFA) 108 (90 Base) MCG/ACT inhaler 6.7 g 2    Sig: Inhale 2 puffs into the lungs every 4 (four) hours as needed for wheezing or shortness of breath.  Marland Kitchen  metoprolol succinate (TOPROL-XL) 25 MG 24 hr tablet 90 tablet 3    Sig: Take 1 tablet (25 mg total) by mouth daily.

## 2020-01-19 ENCOUNTER — Other Ambulatory Visit: Payer: Self-pay | Admitting: Family

## 2020-01-19 LAB — COMPREHENSIVE METABOLIC PANEL
AG Ratio: 1 (calc) (ref 1.0–2.5)
ALT: 11 U/L (ref 9–46)
AST: 15 U/L (ref 10–35)
Albumin: 3.8 g/dL (ref 3.6–5.1)
Alkaline phosphatase (APISO): 51 U/L (ref 35–144)
BUN: 15 mg/dL (ref 7–25)
CO2: 28 mmol/L (ref 20–32)
Calcium: 9.5 mg/dL (ref 8.6–10.3)
Chloride: 107 mmol/L (ref 98–110)
Creat: 0.92 mg/dL (ref 0.70–1.33)
Globulin: 3.7 g/dL (calc) (ref 1.9–3.7)
Glucose, Bld: 87 mg/dL (ref 65–99)
Potassium: 4.2 mmol/L (ref 3.5–5.3)
Sodium: 141 mmol/L (ref 135–146)
Total Bilirubin: 0.9 mg/dL (ref 0.2–1.2)
Total Protein: 7.5 g/dL (ref 6.1–8.1)

## 2020-01-19 LAB — CBC WITH DIFFERENTIAL/PLATELET
Absolute Monocytes: 600 cells/uL (ref 200–950)
Basophils Absolute: 18 cells/uL (ref 0–200)
Basophils Relative: 0.3 %
Eosinophils Absolute: 90 cells/uL (ref 15–500)
Eosinophils Relative: 1.5 %
HCT: 43.2 % (ref 38.5–50.0)
Hemoglobin: 13.5 g/dL (ref 13.2–17.1)
Lymphs Abs: 2646 cells/uL (ref 850–3900)
MCH: 24.4 pg — ABNORMAL LOW (ref 27.0–33.0)
MCHC: 31.3 g/dL — ABNORMAL LOW (ref 32.0–36.0)
MCV: 78 fL — ABNORMAL LOW (ref 80.0–100.0)
MPV: 10.6 fL (ref 7.5–12.5)
Monocytes Relative: 10 %
Neutro Abs: 2646 cells/uL (ref 1500–7800)
Neutrophils Relative %: 44.1 %
Platelets: 166 10*3/uL (ref 140–400)
RBC: 5.54 10*6/uL (ref 4.20–5.80)
RDW: 15.9 % — ABNORMAL HIGH (ref 11.0–15.0)
Total Lymphocyte: 44.1 %
WBC: 6 10*3/uL (ref 3.8–10.8)

## 2020-01-19 LAB — LIPID PANEL
Cholesterol: 233 mg/dL — ABNORMAL HIGH (ref <200)
HDL: 55 mg/dL (ref >=40)
LDL Cholesterol (Calc): 152 mg/dL (calc) — ABNORMAL HIGH
Non-HDL Cholesterol (Calc): 178 mg/dL (calc) — ABNORMAL HIGH (ref <130)
Total CHOL/HDL Ratio: 4.2 (calc) (ref <5.0)
Triglycerides: 140 mg/dL (ref <150)

## 2020-01-19 LAB — URIC ACID: Uric Acid, Serum: 8.4 mg/dL — ABNORMAL HIGH (ref 4.0–8.0)

## 2020-01-19 MED ORDER — ALLOPURINOL 100 MG PO TABS: 100.0000 mg | ORAL_TABLET | Freq: Every day | ORAL | 1 refills | Status: DC

## 2020-01-19 MED ORDER — ROSUVASTATIN CALCIUM 10 MG PO TABS: 10.0000 mg | ORAL_TABLET | Freq: Every day | ORAL | 1 refills | Status: DC

## 2020-04-22 ENCOUNTER — Encounter: Payer: Self-pay | Admitting: Family

## 2020-04-22 ENCOUNTER — Ambulatory Visit (INDEPENDENT_AMBULATORY_CARE_PROVIDER_SITE_OTHER): Payer: Medicare Other | Admitting: Family

## 2020-04-22 ENCOUNTER — Other Ambulatory Visit: Payer: Self-pay

## 2020-04-22 VITALS — BP 132/78 | HR 79 | Temp 98.3°F | Ht 71.0 in | Wt 290.6 lb

## 2020-04-22 DIAGNOSIS — E785 Hyperlipidemia, unspecified: Secondary | ICD-10-CM | POA: Diagnosis not present

## 2020-04-22 DIAGNOSIS — Z23 Encounter for immunization: Secondary | ICD-10-CM

## 2020-04-22 DIAGNOSIS — M25432 Effusion, left wrist: Secondary | ICD-10-CM

## 2020-04-22 DIAGNOSIS — M109 Gout, unspecified: Secondary | ICD-10-CM | POA: Diagnosis not present

## 2020-04-22 DIAGNOSIS — I10 Essential (primary) hypertension: Secondary | ICD-10-CM

## 2020-04-22 DIAGNOSIS — M25532 Pain in left wrist: Secondary | ICD-10-CM

## 2020-04-22 LAB — COMPREHENSIVE METABOLIC PANEL
ALT: 15 U/L (ref 0–53)
AST: 15 U/L (ref 0–37)
Albumin: 3.9 g/dL (ref 3.5–5.2)
Alkaline Phosphatase: 55 U/L (ref 39–117)
BUN: 21 mg/dL (ref 6–23)
CO2: 28 mEq/L (ref 19–32)
Calcium: 9.4 mg/dL (ref 8.4–10.5)
Chloride: 104 mEq/L (ref 96–112)
Creatinine, Ser: 0.82 mg/dL (ref 0.40–1.50)
GFR: 95.81 mL/min (ref >60.00)
Glucose, Bld: 92 mg/dL (ref 70–99)
Potassium: 4 mEq/L (ref 3.5–5.1)
Sodium: 139 mEq/L (ref 135–145)
Total Bilirubin: 0.7 mg/dL (ref 0.2–1.2)
Total Protein: 8.2 g/dL (ref 6.0–8.3)

## 2020-04-22 LAB — LIPID PANEL
Cholesterol: 143 mg/dL (ref 0–200)
HDL: 54.2 mg/dL (ref >39.00)
LDL Cholesterol: 79 mg/dL (ref 0–99)
NonHDL: 88.86
Total CHOL/HDL Ratio: 3
Triglycerides: 49 mg/dL (ref 0.0–149.0)
VLDL: 9.8 mg/dL (ref 0.0–40.0)

## 2020-04-22 LAB — URIC ACID: Uric Acid, Serum: 6.1 mg/dL (ref 4.0–7.8)

## 2020-04-22 MED ORDER — PREDNISONE 20 MG PO TABS: 40.0000 mg | ORAL_TABLET | Freq: Every day | ORAL | 0 refills | Status: DC

## 2020-04-22 NOTE — Progress Notes (Signed)
Roberto Horne is a 59 y.o. male with the following history as recorded in EpicCare:  Patient Active Problem List   Diagnosis Date Noted  . Acute pain of left knee 07/28/2018  . Intellectual disability 12/03/2016  . Dental caries 12/03/2016  . Obesity 10/01/2016  . Left foot pain 08/30/2015  . Acute upper respiratory infection 08/30/2015  . Nasal polyps 07/21/2015  . Contusion, arm, upper 03/01/2015  . Hand contusion 03/01/2015  . Obstructive sleep apnea 12/16/2014  . Seasonal and perennial allergic rhinitis 09/21/2014  . Medicare annual wellness visit, subsequent 09/07/2014  . CAP (community acquired pneumonia) 07/07/2014  . Effusion of right knee 07/07/2014  . Hypertension 07/07/2014  . Abdominal pain 07/07/2014  . Pneumonia 07/07/2014  . Asthma, mild persistent 12/15/2012  . Gout, unspecified 12/15/2012  . Esophageal reflux 12/15/2012  . HTN (hypertension) 11/06/2012  . Hyperlipidemia 11/06/2012    Current Outpatient Medications  Medication Sig Dispense Refill  . albuterol (VENTOLIN HFA) 108 (90 Base) MCG/ACT inhaler Inhale 2 puffs into the lungs every 4 (four) hours as needed for wheezing or shortness of breath. 6.7 g 2  . allopurinol (ZYLOPRIM) 100 MG tablet Take 1 tablet (100 mg total) by mouth daily. 90 tablet 1  . budesonide-formoterol (SYMBICORT) 160-4.5 MCG/ACT inhaler TAKE 2 PUFFS BY MOUTH TWICE A DAY 30.6 Inhaler 3  . fluticasone (FLONASE) 50 MCG/ACT nasal spray PLACE 1 SPRAY INTO BOTH NOSTRILS DAILY. 16 g 11  . metoprolol succinate (TOPROL-XL) 25 MG 24 hr tablet Take 1 tablet (25 mg total) by mouth daily. 90 tablet 3  . mometasone (ELOCON) 0.1 % cream Apply 1 application topically 2 (two) times daily. 45 g 0  . OVER THE COUNTER MEDICATION Apply 1 application topically daily as needed (muscle pain). Pt states he uses an otc muscle rub that has a horse on the package    . rosuvastatin (CRESTOR) 10 MG tablet Take 1 tablet (10 mg total) by mouth daily. 90 tablet 1  .  Spacer/Aero-Holding Chambers (AEROCHAMBER MV) inhaler Use as instructed 1 each 0  . predniSONE (DELTASONE) 20 MG tablet Take 2 tablets (40 mg total) by mouth daily with breakfast. 10 tablet 0   No current facility-administered medications for this visit.    Allergies: Ibuprofen  Past Medical History:  Diagnosis Date  . Allergy   . Arthritis   . Asthma   . Depression   . GERD (gastroesophageal reflux disease)   . Gout   . Hyperlipemia   . Hypertension   . Mental impairment   . Pneumonia   . Sleep apnea     Past Surgical History:  Procedure Laterality Date  . BRAIN SURGERY    . FUNCTIONAL ENDOSCOPIC SINUS SURGERY  10/14/2015  . SINUS ENDO W/FUSION Bilateral 10/14/2015   Procedure: ENDOSCOPIC SINUS SURGERY WITH NAVIGATION;  Surgeon: Melida Quitter, MD;  Location: [facility] OR;  Service: ENT;  Laterality: Bilateral;    Family History  Problem Relation Age of Onset  . Arthritis Mother   . Hyperlipidemia Mother   . Hypertension Mother   . Diabetes Mother   . Stomach cancer Mother        possible, not 100 % sure  . Kidney disease Mother   . Arthritis Father   . Hypertension Father   . Diabetes Father   . Asthma Maternal Grandmother   . Rectal cancer Neg Hx   . Colon cancer Neg Hx     Social History   Tobacco Use  . Smoking status:  Never Smoker  . Smokeless tobacco: Never Used  Substance Use Topics  . Alcohol use: No    Subjective:  3 month follow on start of Allopurinol and Crestor; tolerating well; 1 week history of left wrist pain/ swelling; no known injury or trauma; he is suspicious he is having a gout flare; Has been trying to take his blood pressure medication regularly; Denies any chest pain, shortness of breath, blurred vision or headache.    Objective:  Vitals:   04/22/20 1522  BP: 132/78  Pulse: 79  Temp: 98.3 F (36.8 C)  TempSrc: Oral  SpO2: 97%  Weight: 290 lb 9.6 oz (131.8 kg)  Height: 5' 11" (1.803 m)    General: Well developed, well nourished, in  no acute distress  Skin : Warm and dry.  Head: Normocephalic and atraumatic  Lungs: Respirations unlabored; clear to auscultation bilaterally without wheeze, rales, rhonchi  CVS exam: normal rate and regular rhythm.  Abdomen: Soft; nontender; nondistended; normoactive bowel sounds; no masses or hepatosplenomegaly  Musculoskeletal: No deformities; marked swelling noted over left wrist and hand Extremities: No edema, cyanosis, clubbing  Vessels: Symmetric bilaterally  Neurologic: Alert and oriented; speech intact; face symmetrical; moves all extremities well; CNII-XII intact without focal deficit   Assessment:  1. Gout, unspecified cause, unspecified chronicity, unspecified site   2. Hyperlipidemia, unspecified hyperlipidemia type   3. Essential hypertension   4. Pain and swelling of left wrist   5. Needs flu shot     Plan:  1. Check uric acid level today; Rx for Prednisone 40 mg qd x 5 days; will most likely need to increase dosage of Allopurinol;  2. Check lipid panel today; 3. Stable; stay on same medications; 4. Update left wrist X-ray; follow up to be determined;   Flu shot given;   This visit occurred during the SARS-CoV-2 public health emergency.  Safety protocols were in place, including screening questions prior to the visit, additional usage of staff PPE, and extensive cleaning of exam room while observing appropriate contact time as indicated for disinfecting solutions.     No follow-ups on file.  Orders Placed This Encounter  Procedures  . DG Wrist Complete Left    Standing Status:   Future    Standing Expiration Date:   04/22/2021    Order Specific Question:   Reason for Exam (SYMPTOM  OR DIAGNOSIS REQUIRED)    Answer:   left wrist pain/ swelling    Order Specific Question:   Preferred imaging location?    Answer:   [facility]  . Flu Vaccine QUAD 36+ mos IM  . Uric acid    Standing Status:   Future    Number of Occurrences:   1    Standing Expiration  Date:   04/22/2021  . Comp Met (CMET)    Standing Status:   Future    Number of Occurrences:   1    Standing Expiration Date:   04/22/2021  . Lipid panel    Standing Status:   Future    Number of Occurrences:   1    Standing Expiration Date:   04/22/2021    Requested Prescriptions   Signed Prescriptions Disp Refills  . predniSONE (DELTASONE) 20 MG tablet 10 tablet 0    Sig: Take 2 tablets (40 mg total) by mouth daily with breakfast.

## 2020-04-25 ENCOUNTER — Other Ambulatory Visit: Payer: Self-pay | Admitting: Family

## 2020-04-25 MED ORDER — ROSUVASTATIN CALCIUM 10 MG PO TABS: 10.0000 mg | ORAL_TABLET | Freq: Every day | ORAL | 3 refills | Status: DC

## 2020-04-25 MED ORDER — ALLOPURINOL 100 MG PO TABS: 100.0000 mg | ORAL_TABLET | Freq: Every day | ORAL | 3 refills | Status: DC

## 2020-04-26 ENCOUNTER — Ambulatory Visit (INDEPENDENT_AMBULATORY_CARE_PROVIDER_SITE_OTHER)
Admission: RE | Admit: 2020-04-26 | Discharge: 2020-04-26 | Disposition: A | Discharge status: Home / Self Care | Payer: Medicare Other | Source: Ambulatory Visit | Attending: Family | Admitting: Family

## 2020-04-26 ENCOUNTER — Other Ambulatory Visit: Payer: Self-pay

## 2020-04-26 DIAGNOSIS — M25532 Pain in left wrist: Secondary | ICD-10-CM

## 2020-04-26 DIAGNOSIS — M25432 Effusion, left wrist: Secondary | ICD-10-CM | POA: Diagnosis not present

## 2020-05-02 NOTE — Progress Notes (Signed)
Subjective:    I'm seeing this patient as a consultation for Jodi Mourning, NP. Note will be routed back to referring provider/PCP.  CC: L wrist pain  I, Molly Weber, LAT, ATC, am serving as scribe for Dr. Lynne Leader.  HPI: Pt is a 59 y/o male presenting w/ c/o L wrist pain and swelling x 1 week w/ no known MOI.   Of note, pt has a hx of gout.  He locates his pain to his L hand (MCPJ) and radiates into his L wrist and forearm.  L wrist mechanical symptoms: No Aggravating factors: pain is worse at night; gripping Rx tried: short course prednisone; prednisone did help.  Dx imagining: L wrist XR- 04/26/20  Past medical history, Surgical history, Family history, Social history, Allergies, and medications have been entered into the medical record, reviewed.   Review of Systems: No new headache, visual changes, nausea, vomiting, diarrhea, constipation, dizziness, abdominal pain, skin rash, fevers, chills, night sweats, weight loss, swollen lymph nodes, body aches, joint swelling, muscle aches, chest pain, shortness of breath, mood changes, visual or auditory hallucinations.   Objective:    Vitals:   05/03/20 1449  BP: 112/70  Pulse: 92  SpO2: 94%   General: Well Developed, well nourished, and in no acute distress.  . MSK: Left wrist swollen appearing Mildly tender palpation dorsal wrist. Normal wrist motion. Strength intact. Pulses cap refill and sensation are intact distally.  Lab and Radiology Results DG Wrist Complete Left  Result Date: 04/27/2020 CLINICAL DATA:  Left wrist pain for several days, no known injury, initial encounter EXAM: LEFT WRIST - COMPLETE 3+ VIEW COMPARISON:  11/21/2009 FINDINGS: No acute fracture or dislocation is noted. No soft tissue abnormality is seen. Cystic changes noted within the scaphoid new from the prior exam. This is likely degenerative in nature. IMPRESSION: No acute abnormality noted. Electronically Signed   By: Inez Catalina M.D.   On:  04/27/2020 22:46   I, Lynne Leader, personally (independently) visualized and performed the interpretation of the images attached in this note.   Procedure: Real-time Ultrasound Guided Injection of left dorsal wrist Device: Philips Affiniti 50G Images permanently stored and available for review in PACS Ultrasound examination of left wrist prior to injection reveals normal-appearing dorsal wrist tendons and tendon sheaths.  Moderate wrist effusion is visible on ultrasound prior to injection. Verbal informed consent obtained.  Discussed risks and benefits of procedure. Warned about infection bleeding damage to structures skin hypopigmentation and fat atrophy among others. Patient expresses understanding and agreement Time-out conducted.   Noted no overlying erythema, induration, or other signs of local infection.   Skin prepped in a sterile fashion.   Local anesthesia: Topical Ethyl chloride.   With sterile technique and under real time ultrasound guidance:  40 mg of Kenalog and 1 mL of Marcaine injected into dorsal wrist joint. Fluid seen entering the joint.   Completed without difficulty   Pain immediately resolved suggesting accurate placement of the medication.   Advised to call if fevers/chills, erythema, induration, drainage, or persistent bleeding.   Images permanently stored and available for review in the ultrasound unit.  Impression: Technically successful ultrasound guided injection.        Lab Results  Component Value Date   LABURIC 6.1 04/22/2020     Impression and Recommendations:    Assessment and Plan: 60 y.o. male with left wrist pain and swelling.  Unclear etiology.  Very likely due to gout however uric acid is pretty  well controlled when checked just recently.  Plan for injection and trial of colchicine as well as wrist brace.  If not improving patient will notify me and recheck..  Plan on recheck in about a month.  PDMP not reviewed this encounter. Orders  Placed This Encounter  Procedures  . [place] LIMITED JOINT SPACE STRUCTURES UP LEFT(NO LINKED CHARGES)    Order Specific Question:   Reason for Exam (SYMPTOM  OR DIAGNOSIS REQUIRED)    Answer:   L wrist pain    Order Specific Question:   Preferred imaging location?    Answer:   [place]   Meds ordered this encounter  Medications  . colchicine 0.6 MG tablet    Sig: Take 1 tablet (0.6 mg total) by mouth daily as needed (gout pain).    Dispense:  30 tablet    Refill:  2    Discussed warning signs or symptoms. Please see discharge instructions. Patient expresses understanding.   The above documentation has been reviewed and is accurate and complete Lynne Leader, M.D.

## 2020-05-03 ENCOUNTER — Other Ambulatory Visit: Payer: Self-pay

## 2020-05-03 ENCOUNTER — Ambulatory Visit (INDEPENDENT_AMBULATORY_CARE_PROVIDER_SITE_OTHER): Payer: Medicare Other | Admitting: Family Medicine

## 2020-05-03 ENCOUNTER — Ambulatory Visit: Payer: Self-pay

## 2020-05-03 ENCOUNTER — Encounter: Payer: Self-pay | Admitting: Family Medicine

## 2020-05-03 VITALS — BP 112/70 | HR 92 | Ht 71.0 in | Wt 296.0 lb

## 2020-05-03 DIAGNOSIS — M25532 Pain in left wrist: Secondary | ICD-10-CM

## 2020-05-03 DIAGNOSIS — M79642 Pain in left hand: Secondary | ICD-10-CM

## 2020-05-03 MED ORDER — COLCHICINE 0.6 MG PO TABS: 0.6000 mg | ORAL_TABLET | Freq: Every day | ORAL | 2 refills | Status: DC | PRN

## 2020-05-03 NOTE — Patient Instructions (Signed)
Thank you for coming in today.  Plan for injection today.  Also will use the colchicine daily as needed for pain and swelling due to gout.   Use a wrist brace as needed.    If not better let me know.   Recheck in 1 month unless feeling a lot better.

## 2020-06-01 NOTE — Progress Notes (Deleted)
   I, Roberto Horne, LAT, ATC acting as a scribe for Roberto Leader, MD.  Roberto Horne is a 59 y.o. male who presents to [place] at [facility] today for f/u L wrist pain that's been ongoing since beginning of Nov. Of note, pt has a hx of gout. Pt was last seen by Dr. Georgina Horne on 05/03/20, was given a L wrist steroid injection, and was advised to try colchicine and a wrist brace. Today, Pt reports //. Pt locates pain to his L hand (MCPJ) and radiates into his L wrist and forearm.  Aggravating factors: pain is worse at night; gripping Rx tried: short course prednisone; prednisone did help.  Dx imagining: 04/26/20 L wrist XR  Labs: 04/22/20 Uric acid 6.1 mg/dL  Pertinent review of systems: ***  Relevant historical information: ***   Exam:  There were no vitals taken for this visit. General: Well Developed, well nourished, and in no acute distress.   MSK: ***    Lab and Radiology Results No results found for this or any previous visit (from the past 72 hour(s)). No results found.     Assessment and Plan: 59 y.o. male with ***   PDMP not reviewed this encounter. No orders of the defined types were placed in this encounter.  No orders of the defined types were placed in this encounter.    Discussed warning signs or symptoms. Please see discharge instructions. Patient expresses understanding.   ***

## 2020-06-02 ENCOUNTER — Ambulatory Visit: Payer: Medicare Other | Admitting: Family Medicine

## 2020-07-28 IMAGING — DX DG CHEST 2V
2 series · 2 of 2 positions shown · non-contrast
Comparison: May 14, 2018

CLINICAL DATA: Cough and wheezing with shortness of breath

EXAM:
CHEST - 2 VIEW

[chest pa]
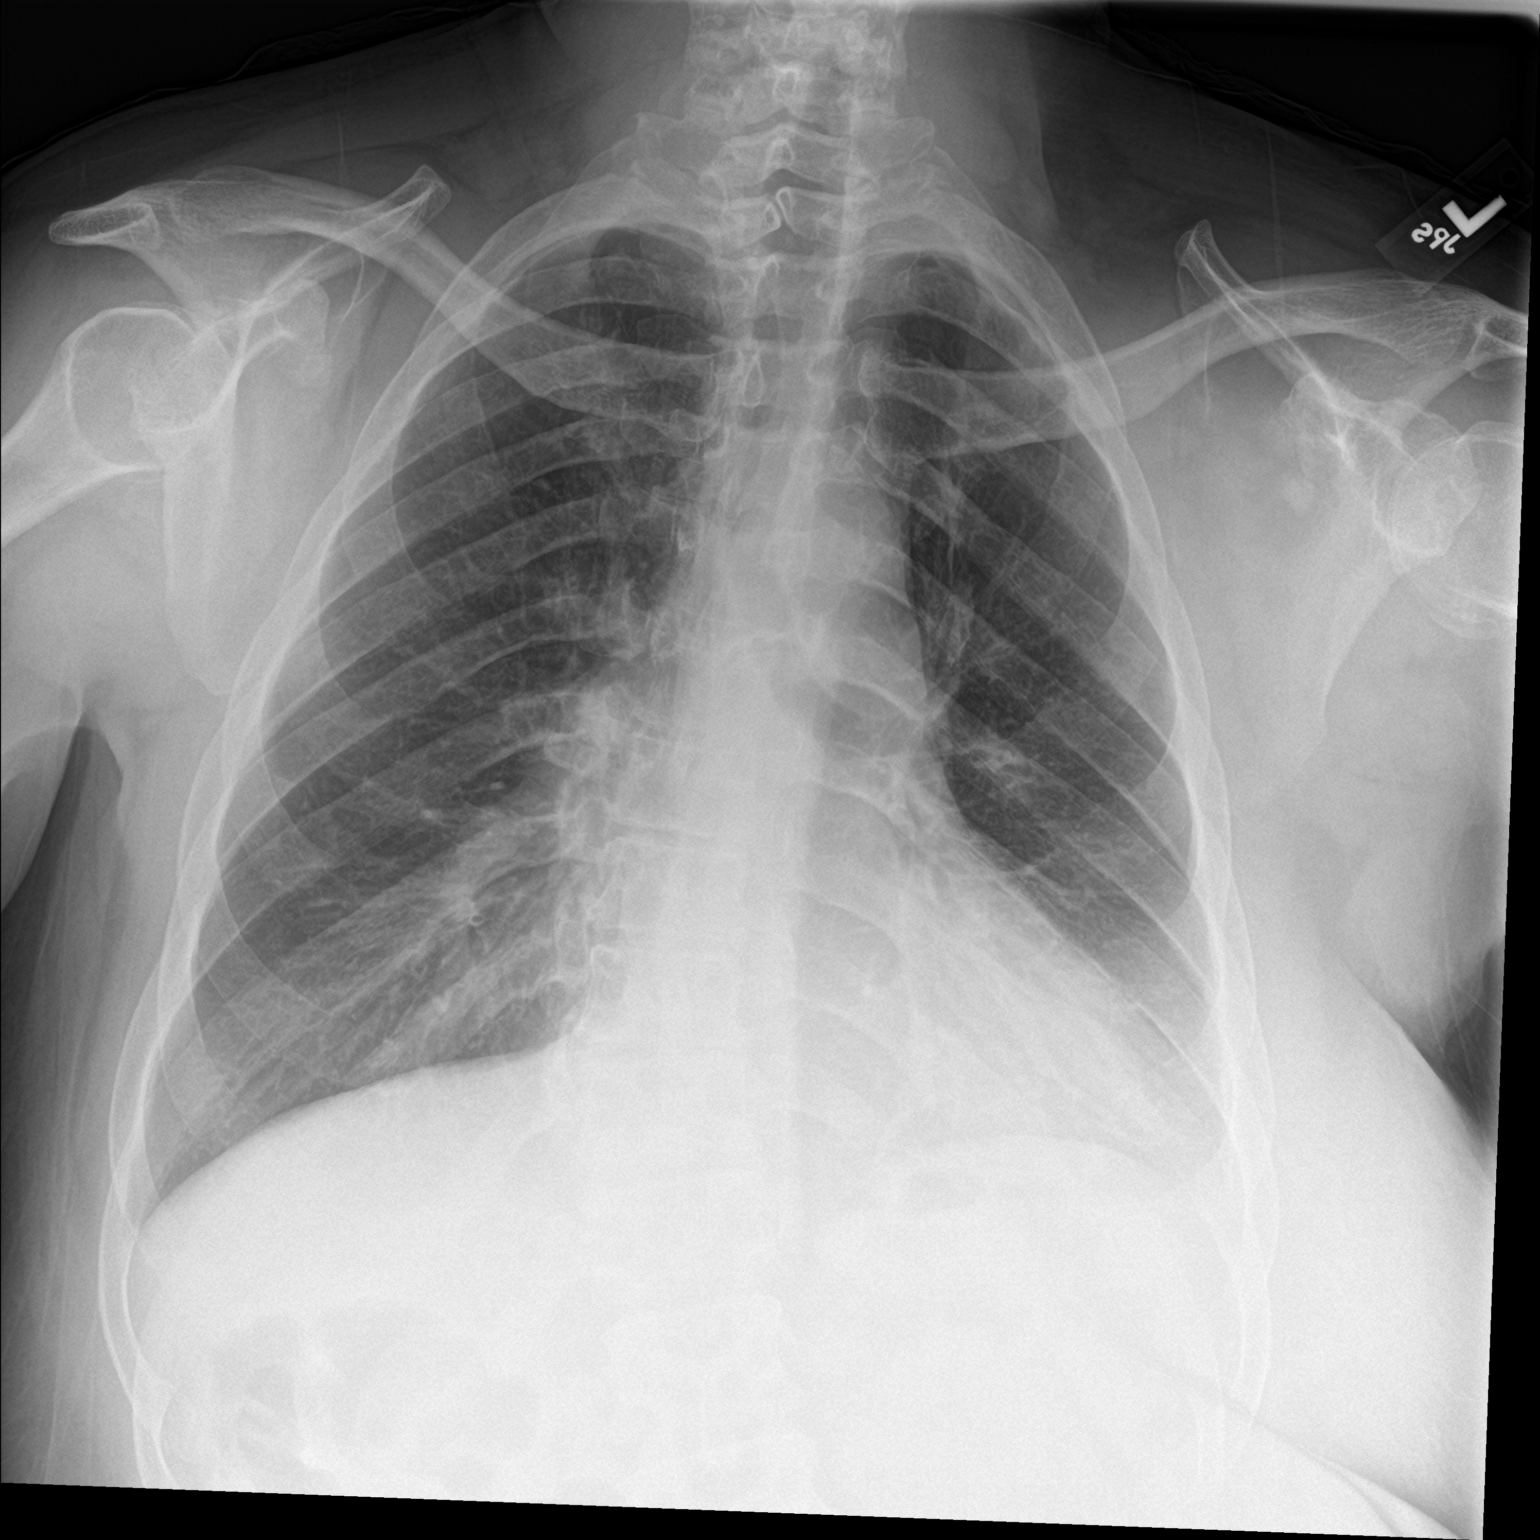

[chest lat]
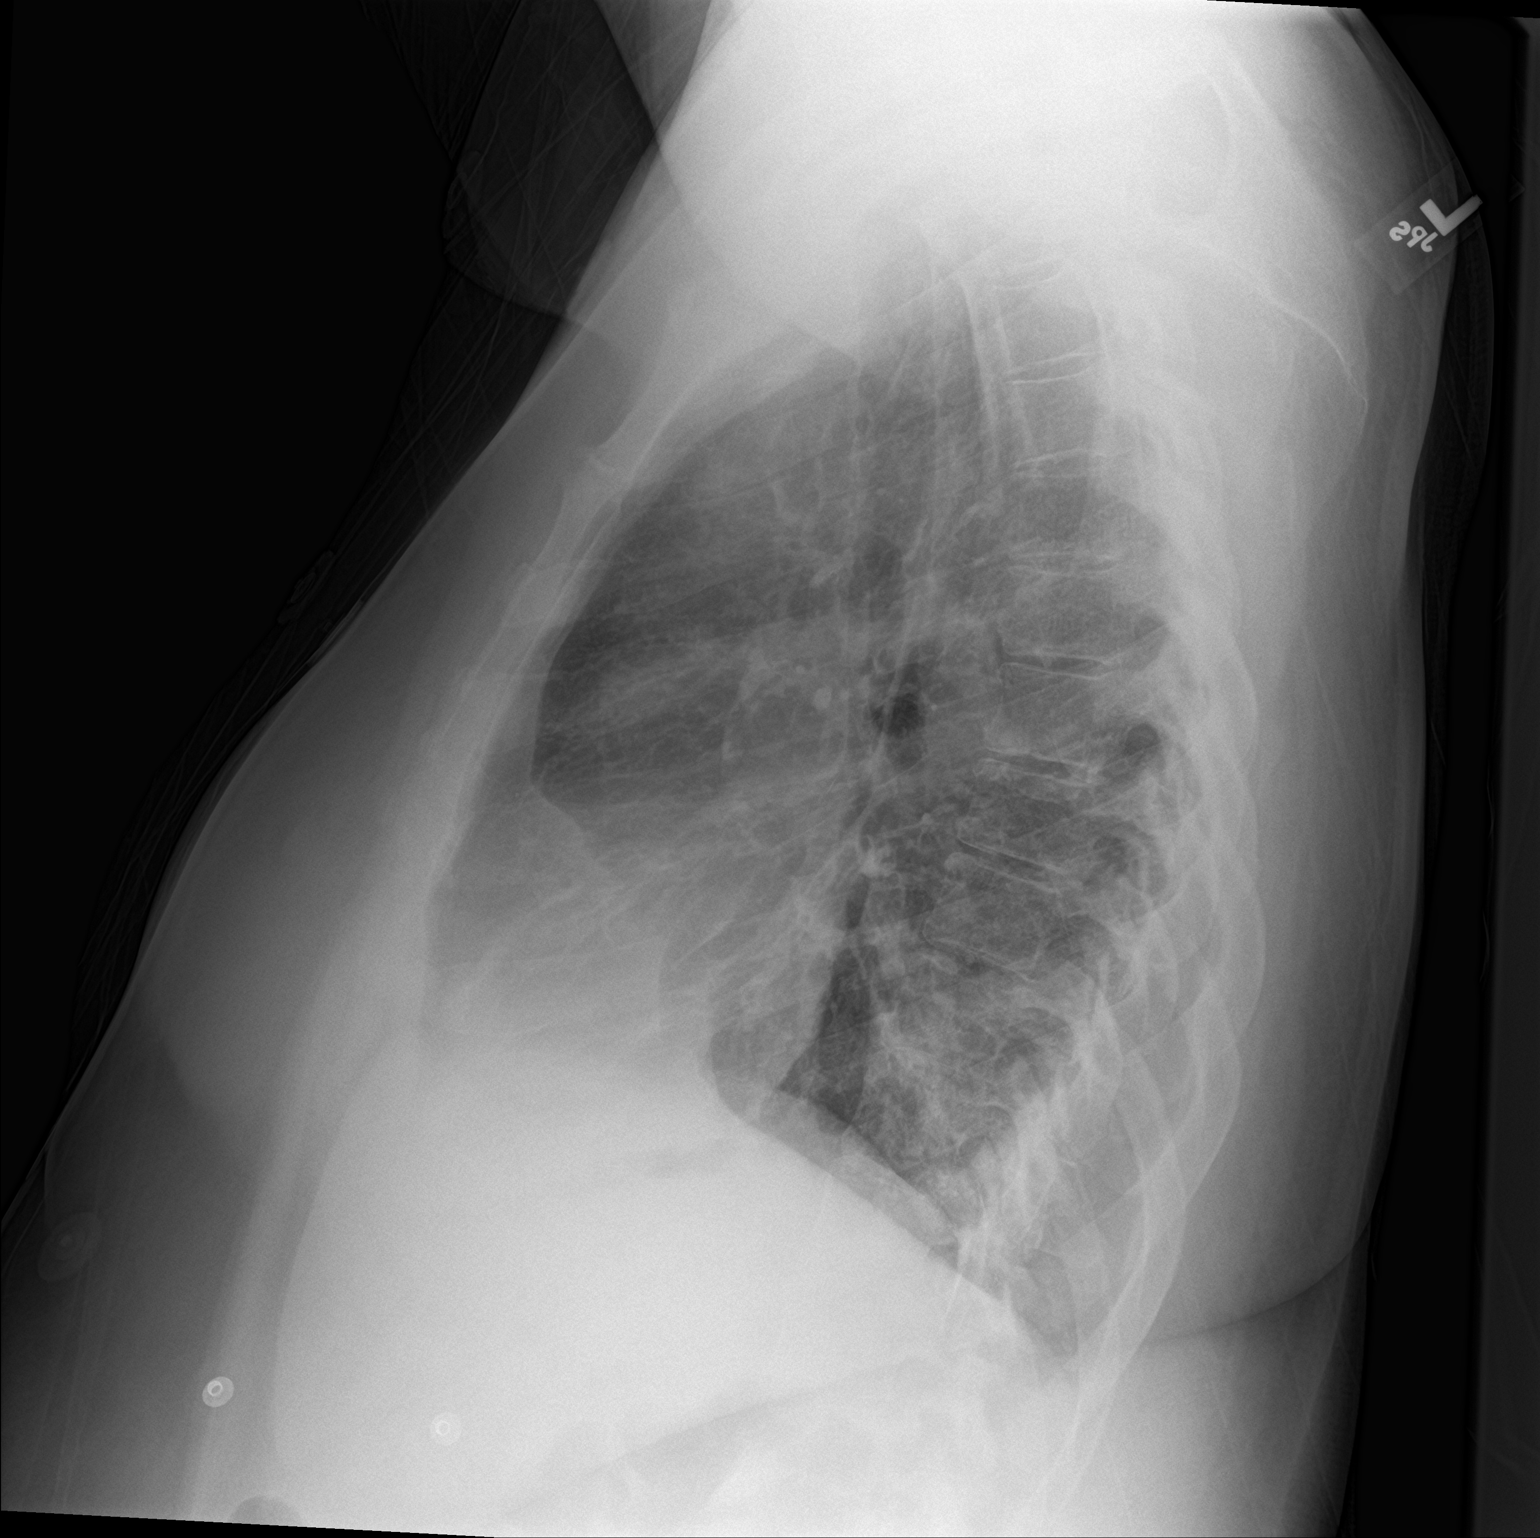

[2 of 2 positions shown; findings below may reference images not displayed]

FINDINGS: There is mild scarring in the left upper lobe. There is no
appreciable edema or consolidation. The heart size and pulmonary
vascularity are normal. No adenopathy. No bone lesions evident.
IMPRESSION: Mild scarring left upper lobe. No edema or consolidation. Stable
cardiac silhouette.

## 2020-08-04 ENCOUNTER — Telehealth: Payer: Self-pay | Admitting: Family

## 2020-08-04 NOTE — Telephone Encounter (Signed)
LVM for pt to rtn my call to schedule awv with nha.. Please schedule AWV if pt calls the office.  

## 2020-08-28 ENCOUNTER — Other Ambulatory Visit: Payer: Self-pay | Admitting: Family Medicine

## 2020-08-29 NOTE — Telephone Encounter (Signed)
Rx refill request approved per Dr. Corey's orders. 

## 2020-08-31 ENCOUNTER — Other Ambulatory Visit: Payer: Self-pay | Admitting: Family

## 2020-11-13 ENCOUNTER — Other Ambulatory Visit: Payer: Self-pay | Admitting: Family Medicine

## 2020-11-14 NOTE — Telephone Encounter (Signed)
Please advise 

## 2021-02-06 ENCOUNTER — Other Ambulatory Visit: Payer: Self-pay | Admitting: Family Medicine

## 2021-02-17 ENCOUNTER — Ambulatory Visit (INDEPENDENT_AMBULATORY_CARE_PROVIDER_SITE_OTHER): Payer: Medicare Other | Admitting: Family

## 2021-02-17 ENCOUNTER — Other Ambulatory Visit: Payer: Self-pay

## 2021-02-17 ENCOUNTER — Encounter: Payer: Self-pay | Admitting: Family

## 2021-02-17 VITALS — BP 120/60 | HR 86 | Temp 98.4°F | Ht 71.0 in | Wt 289.6 lb

## 2021-02-17 DIAGNOSIS — Z0000 Encounter for general adult medical examination without abnormal findings: Secondary | ICD-10-CM | POA: Diagnosis not present

## 2021-02-17 DIAGNOSIS — M109 Gout, unspecified: Secondary | ICD-10-CM | POA: Diagnosis not present

## 2021-02-17 DIAGNOSIS — K089 Disorder of teeth and supporting structures, unspecified: Secondary | ICD-10-CM

## 2021-02-17 DIAGNOSIS — G8929 Other chronic pain: Secondary | ICD-10-CM

## 2021-02-17 DIAGNOSIS — Z125 Encounter for screening for malignant neoplasm of prostate: Secondary | ICD-10-CM

## 2021-02-17 DIAGNOSIS — E782 Mixed hyperlipidemia: Secondary | ICD-10-CM

## 2021-02-17 MED ORDER — FLUTICASONE PROPIONATE 50 MCG/ACT NA SUSP: NASAL | 11 refills | Status: DC

## 2021-02-17 MED ORDER — ALBUTEROL SULFATE HFA 108 (90 BASE) MCG/ACT IN AERS: 2.0000 | INHALATION_SPRAY | RESPIRATORY_TRACT | 2 refills | Status: DC | PRN

## 2021-02-17 MED ORDER — ALLOPURINOL 100 MG PO TABS: 100.0000 mg | ORAL_TABLET | Freq: Every day | ORAL | 3 refills | Status: AC

## 2021-02-17 MED ORDER — ROSUVASTATIN CALCIUM 10 MG PO TABS: 10.0000 mg | ORAL_TABLET | Freq: Every day | ORAL | 3 refills | Status: DC

## 2021-02-17 MED ORDER — METOPROLOL SUCCINATE ER 25 MG PO TB24: 25.0000 mg | ORAL_TABLET | Freq: Every day | ORAL | 3 refills | Status: AC

## 2021-02-17 MED ORDER — BUDESONIDE-FORMOTEROL FUMARATE 160-4.5 MCG/ACT IN AERO: INHALATION_SPRAY | RESPIRATORY_TRACT | 3 refills | Status: DC

## 2021-02-17 NOTE — Progress Notes (Signed)
Roberto Horne is a 60 y.o. male with the following history as recorded in EpicCare:  Patient Active Problem List   Diagnosis Date Noted   Acute pain of left knee 07/28/2018   Intellectual disability 12/03/2016   Dental caries 12/03/2016   Obesity 10/01/2016   Left foot pain 08/30/2015   Acute upper respiratory infection 08/30/2015   Nasal polyps 07/21/2015   Contusion, arm, upper 03/01/2015   Hand contusion 03/01/2015   Obstructive sleep apnea 12/16/2014   Seasonal and perennial allergic rhinitis 09/21/2014   Medicare annual wellness visit, subsequent 09/07/2014   CAP (community acquired pneumonia) 07/07/2014   Effusion of right knee 07/07/2014   Hypertension 07/07/2014   Abdominal pain 07/07/2014   Pneumonia 07/07/2014   Asthma, mild persistent 12/15/2012   Gout, unspecified 12/15/2012   Esophageal reflux 12/15/2012   HTN (hypertension) 11/06/2012   Hyperlipidemia 11/06/2012    Current Outpatient Medications  Medication Sig Dispense Refill   colchicine 0.6 MG tablet TAKE 1 TABLET (0.6 MG TOTAL) BY MOUTH DAILY AS NEEDED (GOUT PAIN). 90 tablet 0   omeprazole (PRILOSEC) 20 MG capsule Take 20 mg by mouth daily.     OVER THE COUNTER MEDICATION Apply 1 application topically daily as needed (muscle pain). Pt states he uses an otc muscle rub that has a horse on the package     Spacer/Aero-Holding Chambers (AEROCHAMBER MV) inhaler Use as instructed 1 each 0   albuterol (VENTOLIN HFA) 108 (90 Base) MCG/ACT inhaler Inhale 2 puffs into the lungs every 4 (four) hours as needed for wheezing or shortness of breath. 6.7 g 2   allopurinol (ZYLOPRIM) 100 MG tablet Take 1 tablet (100 mg total) by mouth daily. 90 tablet 3   budesonide-formoterol (SYMBICORT) 160-4.5 MCG/ACT inhaler TAKE 2 PUFFS BY MOUTH TWICE A DAY 30.6 each 3   fluticasone (FLONASE) 50 MCG/ACT nasal spray PLACE 1 SPRAY INTO BOTH NOSTRILS DAILY. 16 g 11   metoprolol succinate (TOPROL-XL) 25 MG 24 hr tablet Take 1 tablet (25 mg  total) by mouth daily. 90 tablet 3   rosuvastatin (CRESTOR) 10 MG tablet Take 1 tablet (10 mg total) by mouth daily. 90 tablet 3   No current facility-administered medications for this visit.    Allergies: Ibuprofen  Past Medical History:  Diagnosis Date   Allergy    Arthritis    Asthma    Depression    GERD (gastroesophageal reflux disease)    Gout    Hyperlipemia    Hypertension    Mental impairment    Pneumonia    Sleep apnea     Past Surgical History:  Procedure Laterality Date   BRAIN SURGERY     FUNCTIONAL ENDOSCOPIC SINUS SURGERY  10/14/2015   SINUS ENDO W/FUSION Bilateral 10/14/2015   Procedure: ENDOSCOPIC SINUS SURGERY WITH NAVIGATION;  Surgeon: Melida Quitter, MD;  Location: [facility];  Service: ENT;  Laterality: Bilateral;    Family History  Problem Relation Age of Onset   Arthritis Mother    Hyperlipidemia Mother    Hypertension Mother    Diabetes Mother    Stomach cancer Mother        possible, not 100 % sure   Kidney disease Mother    Arthritis Father    Hypertension Father    Diabetes Father    Asthma Maternal Grandmother    Rectal cancer Neg Hx    Colon cancer Neg Hx     Social History   Tobacco Use   Smoking status: Never  Smokeless tobacco: Never  Substance Use Topics   Alcohol use: No    Subjective:   Presents for yearly CPE; accompanied by sister-in-law who helps manage his care;  Requesting clearance to participate in [employer]; Does mention increased cough- per sister in law, had a normal CXR in July 2022; admits is not taking his reflux medication;   Review of Systems  HENT: Negative.    Eyes: Negative.   Respiratory:  Positive for cough.   Cardiovascular: Negative.   Gastrointestinal: Negative.   Genitourinary: Negative.   Musculoskeletal: Negative.   Skin: Negative.   Neurological: Negative.   Endo/Heme/Allergies: Negative.   Psychiatric/Behavioral: Negative.        Objective:  Vitals:   02/17/21 1440  BP: 120/60   Pulse: 86  Temp: 98.4 F (36.9 C)  TempSrc: Oral  SpO2: 94%  Weight: 289 lb 9.6 oz (131.4 kg)  Height: 5' 11" (1.803 m)    General: Well developed, well nourished, in no acute distress  Skin : Warm and dry.  Head: Normocephalic and atraumatic  Eyes: Sclera and conjunctiva clear; pupils round and reactive to light; extraocular movements intact  Ears: External normal; canals clear; tympanic membranes normal  Oropharynx: Pink, supple. No suspicious lesions  Neck: Supple without thyromegaly, adenopathy  Lungs: Respirations unlabored; clear to auscultation bilaterally without wheeze, rales, rhonchi  CVS exam: normal rate and regular rhythm.  Abdomen: Soft; nontender; nondistended; normoactive bowel sounds; no masses or hepatosplenomegaly  Musculoskeletal: No deformities; no active joint inflammation  Extremities: No edema, cyanosis, clubbing  Vessels: Symmetric bilaterally  Neurologic: Alert and oriented; speech intact; face symmetrical; moves all extremities well; CNII-XII intact without focal deficit  Assessment:  1. PE (physical exam), annual   2. Mixed hyperlipidemia   3. Prostate cancer screening   4. Gout, unspecified cause, unspecified chronicity, unspecified site   5. Chronic dental pain     Plan:  Age appropriate preventive healthcare needs addressed; encouraged regular eye doctor and dental exams; encouraged regular exercise; will update labs and refills as needed today; follow-up to be determined; Encouraged to start taking his Omeprazole 20 mg daily; follow up if cough persists after doing this- normal CXR in July 2022; Cleared to participate in [employer];  This visit occurred during the SARS-CoV-2 public health emergency.  Safety protocols were in place, including screening questions prior to the visit, additional usage of staff PPE, and extensive cleaning of exam room while observing appropriate contact time as indicated for disinfecting solutions.    No  follow-ups on file.  Orders Placed This Encounter  Procedures   CBC with Differential/Platelet   Comp Met (CMET)   Lipid panel   PSA   Uric acid   Ambulatory referral to Dentistry    Referral Priority:   Routine    Referral Type:   Consultation    Referral Reason:   Specialty Services Required    Requested Specialty:   Dental General Practice    Number of Visits Requested:   1    Requested Prescriptions   Signed Prescriptions Disp Refills   allopurinol (ZYLOPRIM) 100 MG tablet 90 tablet 3    Sig: Take 1 tablet (100 mg total) by mouth daily.   albuterol (VENTOLIN HFA) 108 (90 Base) MCG/ACT inhaler 6.7 g 2    Sig: Inhale 2 puffs into the lungs every 4 (four) hours as needed for wheezing or shortness of breath.   budesonide-formoterol (SYMBICORT) 160-4.5 MCG/ACT inhaler 30.6 each 3    Sig: TAKE  2 PUFFS BY MOUTH TWICE A DAY   metoprolol succinate (TOPROL-XL) 25 MG 24 hr tablet 90 tablet 3    Sig: Take 1 tablet (25 mg total) by mouth daily.   rosuvastatin (CRESTOR) 10 MG tablet 90 tablet 3    Sig: Take 1 tablet (10 mg total) by mouth daily.   fluticasone (FLONASE) 50 MCG/ACT nasal spray 16 g 11    Sig: PLACE 1 SPRAY INTO BOTH NOSTRILS DAILY.

## 2021-02-18 LAB — COMPREHENSIVE METABOLIC PANEL
AG Ratio: 1.4 (calc) (ref 1.0–2.5)
ALT: 12 U/L (ref 9–46)
AST: 17 U/L (ref 10–35)
Albumin: 4.1 g/dL (ref 3.6–5.1)
Alkaline phosphatase (APISO): 71 U/L (ref 35–144)
BUN: 14 mg/dL (ref 7–25)
CO2: 29 mmol/L (ref 20–32)
Calcium: 9.5 mg/dL (ref 8.6–10.3)
Chloride: 105 mmol/L (ref 98–110)
Creat: 1.07 mg/dL (ref 0.70–1.35)
Globulin: 3 g/dL (calc) (ref 1.9–3.7)
Glucose, Bld: 87 mg/dL (ref 65–99)
Potassium: 4.4 mmol/L (ref 3.5–5.3)
Sodium: 141 mmol/L (ref 135–146)
Total Bilirubin: 0.9 mg/dL (ref 0.2–1.2)
Total Protein: 7.1 g/dL (ref 6.1–8.1)

## 2021-02-18 LAB — CBC WITH DIFFERENTIAL/PLATELET
Absolute Monocytes: 632 cells/uL (ref 200–950)
Basophils Absolute: 27 cells/uL (ref 0–200)
Basophils Relative: 0.4 %
Eosinophils Absolute: 238 cells/uL (ref 15–500)
Eosinophils Relative: 3.5 %
HCT: 44.4 % (ref 38.5–50.0)
Hemoglobin: 13.8 g/dL (ref 13.2–17.1)
Lymphs Abs: 2829 cells/uL (ref 850–3900)
MCH: 24.3 pg — ABNORMAL LOW (ref 27.0–33.0)
MCHC: 31.1 g/dL — ABNORMAL LOW (ref 32.0–36.0)
MCV: 78.2 fL — ABNORMAL LOW (ref 80.0–100.0)
MPV: 11.2 fL (ref 7.5–12.5)
Monocytes Relative: 9.3 %
Neutro Abs: 3074 cells/uL (ref 1500–7800)
Neutrophils Relative %: 45.2 %
Platelets: 179 10*3/uL (ref 140–400)
RBC: 5.68 10*6/uL (ref 4.20–5.80)
RDW: 15.3 % — ABNORMAL HIGH (ref 11.0–15.0)
Total Lymphocyte: 41.6 %
WBC: 6.8 10*3/uL (ref 3.8–10.8)

## 2021-02-18 LAB — LIPID PANEL
Cholesterol: 145 mg/dL (ref <200)
HDL: 49 mg/dL (ref >=40)
LDL Cholesterol (Calc): 78 mg/dL (calc)
Non-HDL Cholesterol (Calc): 96 mg/dL (calc) (ref <130)
Total CHOL/HDL Ratio: 3 (calc) (ref <5.0)
Triglycerides: 101 mg/dL (ref <150)

## 2021-02-18 LAB — URIC ACID: Uric Acid, Serum: 6.8 mg/dL (ref 4.0–8.0)

## 2021-02-18 LAB — PSA: PSA: 0.61 ng/mL (ref <=4.00)

## 2021-02-21 NOTE — Progress Notes (Signed)
Lab letter sent 

## 2021-05-17 ENCOUNTER — Telehealth: Payer: Self-pay | Admitting: Family

## 2021-05-17 MED ORDER — COLCHICINE 0.6 MG PO TABS: 0.6000 mg | ORAL_TABLET | Freq: Two times a day (BID) | ORAL | 0 refills | Status: DC | PRN

## 2021-05-17 MED ORDER — BUDESONIDE-FORMOTEROL FUMARATE 160-4.5 MCG/ACT IN AERO: INHALATION_SPRAY | RESPIRATORY_TRACT | 3 refills | Status: DC

## 2021-05-17 MED ORDER — ALBUTEROL SULFATE HFA 108 (90 BASE) MCG/ACT IN AERS: 2.0000 | INHALATION_SPRAY | RESPIRATORY_TRACT | 2 refills | Status: AC | PRN

## 2021-05-17 NOTE — Telephone Encounter (Signed)
I sent a prescription for colchicine 1 tablet twice daily if he feels he has gout. If severe symptoms needs to be seen.  Colchicine can interact with rosuvastatin, recommend to hold rosuvastatin 2 to 3 times if he decide to take colchicine.

## 2021-05-17 NOTE — Telephone Encounter (Signed)
Spoke w/ [name]- informed of recommendations. [name] verbalized understanding.

## 2021-05-17 NOTE — Telephone Encounter (Signed)
Medication:  1.albuterol (VENTOLIN HFA) 108 (90 Base) MCG/ACT inhaler   2.metoprolol succinate (TOPROL-XL) 25 MG 24 hr tablet  3.budesonide-formoterol (SYMBICORT) 160-4.5 MCG/ACT inhaler     Has the patient contacted their pharmacy? Yes.   (If no, request that the patient contact the pharmacy for the refill.) (If yes, when and what did the pharmacy advise?)  Patient stated pharmacy has no refill for any of those medications.  Preferred Pharmacy (with phone number or street name):  [id number] - [place], [place]  [address], [place] [address]  Phone:  [phone]  Fax:  [phone]   Agent: Please be advised that RX refills may take up to 3 business days. We ask that you follow-up with your pharmacy.

## 2021-05-17 NOTE — Telephone Encounter (Signed)
Spoke with sister and I advised that we can send in refills for albuterol and symbicort but the metoprolol should have refills.  She also stated that she called another office and stated that she needed refills for gout medication.  I advised to her that he is taking the allopurinol which should prevent any gout flare ups and she should have refills on that.  She stated that he was having pain in his foot and think it may be a gout flare up and wondering if he could get a refill on colchicine?

## 2021-05-18 ENCOUNTER — Emergency Department (HOSPITAL_COMMUNITY)
Admission: EM | Admit: 2021-05-18 | Discharge: 2021-05-18 | Disposition: A | Discharge status: Home / Self Care | Payer: Medicare Other | Attending: Emergency Medicine | Admitting: Emergency Medicine

## 2021-05-18 ENCOUNTER — Other Ambulatory Visit: Payer: Self-pay

## 2021-05-18 ENCOUNTER — Encounter (HOSPITAL_COMMUNITY): Payer: Self-pay

## 2021-05-18 DIAGNOSIS — I10 Essential (primary) hypertension: Secondary | ICD-10-CM | POA: Diagnosis not present

## 2021-05-18 DIAGNOSIS — M10061 Idiopathic gout, right knee: Secondary | ICD-10-CM | POA: Insufficient documentation

## 2021-05-18 DIAGNOSIS — Z7951 Long term (current) use of inhaled steroids: Secondary | ICD-10-CM | POA: Insufficient documentation

## 2021-05-18 DIAGNOSIS — J4530 Mild persistent asthma, uncomplicated: Secondary | ICD-10-CM | POA: Diagnosis not present

## 2021-05-18 DIAGNOSIS — M25561 Pain in right knee: Secondary | ICD-10-CM | POA: Diagnosis present

## 2021-05-18 DIAGNOSIS — Z79899 Other long term (current) drug therapy: Secondary | ICD-10-CM | POA: Insufficient documentation

## 2021-05-18 MED ORDER — KETOROLAC TROMETHAMINE 15 MG/ML IJ SOLN
15.0000 mg | Freq: Once | INTRAMUSCULAR | Status: AC
  Administered 2021-05-18: 15 mg via INTRAMUSCULAR
  Filled 2021-05-18: qty 1

## 2021-05-18 MED ORDER — ACETAMINOPHEN 500 MG PO TABS
1000.0000 mg | ORAL_TABLET | Freq: Once | ORAL | Status: AC
  Administered 2021-05-18: 1000 mg via ORAL
  Filled 2021-05-18: qty 2

## 2021-05-18 MED ORDER — OXYCODONE HCL 5 MG PO TABS
5.0000 mg | ORAL_TABLET | Freq: Once | ORAL | Status: AC
  Administered 2021-05-18: 5 mg via ORAL
  Filled 2021-05-18: qty 1

## 2021-05-18 MED ORDER — PREDNISONE 20 MG PO TABS: ORAL_TABLET | ORAL | 0 refills | Status: DC

## 2021-05-18 MED ORDER — COLCHICINE 0.6 MG PO TABS: ORAL_TABLET | ORAL | 0 refills | Status: DC

## 2021-05-18 MED ORDER — MORPHINE SULFATE 15 MG PO TABS: 7.5000 mg | ORAL_TABLET | ORAL | 0 refills | Status: DC | PRN

## 2021-05-18 MED ORDER — KETOROLAC TROMETHAMINE 15 MG/ML IJ SOLN
INTRAMUSCULAR | Status: AC
  Filled 2021-05-18: qty 1

## 2021-05-18 MED ORDER — COLCHICINE 0.6 MG PO TABS
1.2000 mg | ORAL_TABLET | Freq: Once | ORAL | Status: AC
  Administered 2021-05-18: 1.2 mg via ORAL
  Filled 2021-05-18: qty 2

## 2021-05-18 NOTE — ED Provider Notes (Signed)
[facility] EMERGENCY DEPARTMENT Provider Note   CSN: [id number] Arrival date & time: 05/18/21  1239     History Chief Complaint  Patient presents with   Knee Pain    Roberto Horne is a 60 y.o. male.  60 yo M with a chief complaints of right knee pain.  Started yesterday.  Feels like his prior episodes of gout.  He denies any trauma to the area denies fevers.  Has been taking some medications for this but without improvement.  The history is provided by the patient.  Knee Pain Location:  Knee Time since incident:  2 days Injury: no   Knee location:  R knee Pain details:    Quality:  Aching and sharp   Radiates to:  Does not radiate   Severity:  Moderate   Onset quality:  Gradual   Duration:  2 days   Timing:  Constant   Progression:  Worsening Chronicity:  New Dislocation: no   Relieved by:  Nothing Worsened by:  Nothing Ineffective treatments:  None tried Associated symptoms: no fever       Past Medical History:  Diagnosis Date   Allergy    Arthritis    Asthma    Depression    GERD (gastroesophageal reflux disease)    Gout    Hyperlipemia    Hypertension    Mental impairment    Pneumonia    Sleep apnea     Patient Active Problem List   Diagnosis Date Noted   Acute pain of left knee 07/28/2018   Intellectual disability 12/03/2016   Dental caries 12/03/2016   Obesity 10/01/2016   Left foot pain 08/30/2015   Acute upper respiratory infection 08/30/2015   Nasal polyps 07/21/2015   Contusion, arm, upper 03/01/2015   Hand contusion 03/01/2015   Obstructive sleep apnea 12/16/2014   Seasonal and perennial allergic rhinitis 09/21/2014   Medicare annual wellness visit, subsequent 09/07/2014   CAP (community acquired pneumonia) 07/07/2014   Effusion of right knee 07/07/2014   Hypertension 07/07/2014   Abdominal pain 07/07/2014   Pneumonia 07/07/2014   Asthma, mild persistent 12/15/2012   Gout, unspecified 12/15/2012   Esophageal  reflux 12/15/2012   HTN (hypertension) 11/06/2012   Hyperlipidemia 11/06/2012    Past Surgical History:  Procedure Laterality Date   BRAIN SURGERY     FUNCTIONAL ENDOSCOPIC SINUS SURGERY  10/14/2015   SINUS ENDO W/FUSION Bilateral 10/14/2015   Procedure: ENDOSCOPIC SINUS SURGERY WITH NAVIGATION;  Surgeon: Melida Quitter, MD;  Location: [facility] OR;  Service: ENT;  Laterality: Bilateral;       Family History  Problem Relation Age of Onset   Arthritis Mother    Hyperlipidemia Mother    Hypertension Mother    Diabetes Mother    Stomach cancer Mother        possible, not 100 % sure   Kidney disease Mother    Arthritis Father    Hypertension Father    Diabetes Father    Asthma Maternal Grandmother    Rectal cancer Neg Hx    Colon cancer Neg Hx     Social History   Tobacco Use   Smoking status: Never   Smokeless tobacco: Never  Vaping Use   Vaping Use: Never used  Substance Use Topics   Alcohol use: No   Drug use: No    Home Medications Prior to Admission medications   Medication Sig Start Date End Date Taking? Authorizing Provider  colchicine 0.6 MG tablet Try  to take a tablet as close as you can to one hour after you are given one in the ED 05/18/21  Yes Deno Etienne, DO  morphine (MSIR) 15 MG tablet Take 0.5 tablets (7.5 mg total) by mouth every 4 (four) hours as needed for severe pain. 05/18/21  Yes Deno Etienne, DO  predniSONE (DELTASONE) 20 MG tablet 2 tabs po daily x 4 days 05/18/21  Yes Deno Etienne, DO  albuterol (VENTOLIN HFA) 108 (90 Base) MCG/ACT inhaler Inhale 2 puffs into the lungs every 4 (four) hours as needed for wheezing or shortness of breath. 05/17/21   Marrian Salvage, FNP  allopurinol (ZYLOPRIM) 100 MG tablet Take 1 tablet (100 mg total) by mouth daily. 02/17/21   Marrian Salvage, FNP  budesonide-formoterol [facility]) 160-4.5 MCG/ACT inhaler TAKE 2 PUFFS BY MOUTH TWICE A DAY 05/17/21   Marrian Salvage, FNP  fluticasone (FLONASE) 50 MCG/ACT  nasal spray PLACE 1 SPRAY INTO BOTH NOSTRILS DAILY. 02/17/21   Marrian Salvage, FNP  metoprolol succinate (TOPROL-XL) 25 MG 24 hr tablet Take 1 tablet (25 mg total) by mouth daily. 02/17/21   Marrian Salvage, FNP  omeprazole (PRILOSEC) 20 MG capsule Take 20 mg by mouth daily.    [provider]  OVER THE COUNTER MEDICATION Apply 1 application topically daily as needed (muscle pain). Pt states he uses an otc muscle rub that has a horse on the package    [provider]  rosuvastatin (CRESTOR) 10 MG tablet Take 1 tablet (10 mg total) by mouth daily. 02/17/21   Marrian Salvage, FNP  Spacer/Aero-Holding Chambers (AEROCHAMBER MV) inhaler Use as instructed 08/21/17   Deneise Lever, MD    Allergies    Ibuprofen  Review of Systems   Review of Systems  Constitutional:  Negative for chills and fever.  HENT:  Negative for congestion and facial swelling.   Eyes:  Negative for discharge and visual disturbance.  Respiratory:  Negative for shortness of breath.   Cardiovascular:  Negative for chest pain and palpitations.  Gastrointestinal:  Negative for abdominal pain, diarrhea and vomiting.  Musculoskeletal:  Positive for arthralgias. Negative for myalgias.  Skin:  Negative for color change and rash.  Neurological:  Negative for tremors, syncope and headaches.  Psychiatric/Behavioral:  Negative for confusion and dysphoric mood.    Physical Exam Updated Vital Signs BP (!) 160/108 (BP Location: Right Arm)   Pulse 96   Temp 98.2 F (36.8 C) (Oral)   Resp 18   Ht 5\' 11"  (1.803 m)   Wt 131.4 kg   SpO2 99%   BMI 40.40 kg/m   Physical Exam Vitals and nursing note reviewed.  Constitutional:      Appearance: He is well-developed.  HENT:     Head: Normocephalic and atraumatic.  Eyes:     Pupils: Pupils are equal, round, and reactive to light.  Neck:     Vascular: No JVD.  Cardiovascular:     Rate and Rhythm: Normal rate and regular rhythm.     Heart  sounds: No murmur heard.   No friction rub. No gallop.  Pulmonary:     Effort: No respiratory distress.     Breath sounds: No wheezing.  Abdominal:     General: There is no distension.     Tenderness: There is no abdominal tenderness. There is no guarding or rebound.  Musculoskeletal:        General: Swelling and tenderness present. Normal range of motion.  Cervical back: Normal range of motion and neck supple.     Comments: Pain and swelling to the right knee.  Able to range with some discomfort.  Pulse motor and sensation intact distally.    Skin:    Coloration: Skin is not pale.     Findings: No rash.  Neurological:     Mental Status: He is alert and oriented to person, place, and time.  Psychiatric:        Behavior: Behavior normal.    ED Results / Procedures / Treatments   Labs (all labs ordered are listed, but only abnormal results are displayed) Labs Reviewed - No data to display  EKG None  Radiology No results found.  Procedures Procedures   Medications Ordered in ED Medications  ketorolac (TORADOL) 15 MG/ML injection (  Not Given 05/18/21 1452)  ketorolac (TORADOL) 15 MG/ML injection 15 mg (15 mg Intramuscular Given 05/18/21 1435)  acetaminophen (TYLENOL) tablet 1,000 mg (1,000 mg Oral Given 05/18/21 1427)  oxyCODONE (Oxy IR/ROXICODONE) immediate release tablet 5 mg (5 mg Oral Given 05/18/21 1427)  colchicine tablet 1.2 mg (1.2 mg Oral Given 05/18/21 1438)    ED Course  I have reviewed the triage vital signs and the nursing notes.  Pertinent labs & imaging results that were available during my care of the patient were reviewed by me and considered in my medical decision making (see chart for details).    MDM Rules/Calculators/A&P                           60 year old male with a chief complaint of right knee pain.  Feels consistent with his prior episode of gout.  He has had arthrocentesis of this before this diagnosed gout.  He feels comfortable with  the diagnosis of gout and plans on following up with his family doctor in the office.  We will give 2 dose colchicine first dose of steroids short course of pain medicine.  2:57 PM:  I have discussed the diagnosis/risks/treatment options with the patient and believe the pt to be eligible for discharge home to follow-up with PCP. We also discussed returning to the ED immediately if new or worsening sx occur. We discussed the sx which are most concerning (e.g., sudden worsening pain, fever, inability to tolerate by mouth epic wit) that necessitate immediate return. Medications administered to the patient during their visit and any new prescriptions provided to the patient are listed below.  Medications given during this visit Medications  ketorolac (TORADOL) 15 MG/ML injection (  Not Given 05/18/21 1452)  ketorolac (TORADOL) 15 MG/ML injection 15 mg (15 mg Intramuscular Given 05/18/21 1435)  acetaminophen (TYLENOL) tablet 1,000 mg (1,000 mg Oral Given 05/18/21 1427)  oxyCODONE (Oxy IR/ROXICODONE) immediate release tablet 5 mg (5 mg Oral Given 05/18/21 1427)  colchicine tablet 1.2 mg (1.2 mg Oral Given 05/18/21 1438)     The patient appears reasonably screen and/or stabilized for discharge and I doubt any other medical condition or other [facility] requiring further screening, evaluation, or treatment in the ED at this time prior to discharge.   Final Clinical Impression(s) / ED Diagnoses Final diagnoses:  Acute idiopathic gout of right knee    Rx / DC Orders ED Discharge Orders          Ordered    colchicine 0.6 MG tablet        05/18/21 1313    morphine (MSIR) 15 MG tablet  Every 4 hours  PRN        05/18/21 1314    predniSONE (DELTASONE) 20 MG tablet        05/18/21 1314             Deno Etienne, DO 05/18/21 1457

## 2021-05-18 NOTE — Discharge Instructions (Addendum)
Please return for worsening pain or fever.  Take the steroids as prescribed Also take tylenol 1000mg (2 extra strength) four times a day.   Then take the pain medicine if you feel like you need it. Narcotics do not help with the pain, they only make you care about it less.  You can become addicted to this, people may break into your house to steal it.  It will constipate you.  If you drive under the influence of this medicine you can get a DUI.

## 2021-05-18 NOTE — ED Triage Notes (Signed)
Started yesterday with knee pain states he is thinking it may be gout.  While triaging pt was unsure if it was his foot or his knee.  Now states it's not is foot but his right knee. No deformity or injury the right knee appearance is the same bilaterally

## 2021-05-18 NOTE — Progress Notes (Signed)
Orthopedic Tech Progress Note Patient Details:  Roberto Horne [birth year] [id number]   Ortho Devices Type of Ortho Device: Ace wrap, Crutches Ortho Device/Splint Location: Ace-RLE, crutches at bedside Ortho Device/Splint Interventions: Ordered, Application, Adjustment   Post Interventions Patient Tolerated: Well Instructions Provided: Poper ambulation with device, Care of device, Adjustment of device  Roberto Horne [name] 05/18/2021, 3:22 PM

## 2021-06-09 ENCOUNTER — Other Ambulatory Visit: Payer: Self-pay | Admitting: Internal Medicine

## 2021-09-01 ENCOUNTER — Other Ambulatory Visit: Payer: Self-pay | Admitting: Family Medicine

## 2021-09-15 ENCOUNTER — Telehealth: Payer: Self-pay | Admitting: Family

## 2021-09-15 NOTE — Telephone Encounter (Signed)
Left message for patient to call back and schedule Medicare Annual Wellness Visit (AWV) in office.  ? ?If not able to come in office, please offer to do virtually or by telephone.  Left office number and my jabber [phone]. ? ?Last AWV:10/01/2016 ? ?Please schedule at anytime with Nurse Health Advisor. ?  ?

## 2021-09-18 ENCOUNTER — Other Ambulatory Visit: Payer: Self-pay | Admitting: Family Medicine

## 2021-10-19 ENCOUNTER — Telehealth: Payer: Self-pay | Admitting: Family

## 2021-10-24 ENCOUNTER — Ambulatory Visit: Payer: Medicare Other | Admitting: Family

## 2021-11-01 ENCOUNTER — Telehealth: Payer: Self-pay | Admitting: Family

## 2021-11-01 NOTE — Telephone Encounter (Signed)
Left message for patient to call back and schedule Medicare Annual Wellness Visit (AWV).  ? ?Please offer to do virtually or by telephone.  Left office number and my jabber [phone]. ? ?Last AWV:10/01/2016 ? ?Please schedule at anytime with Nurse Health Advisor. ?  ?

## 2021-11-29 ENCOUNTER — Telehealth: Payer: Self-pay | Admitting: Family

## 2021-11-29 NOTE — Telephone Encounter (Signed)
I called to schedule patient's AWV and found out patient has transferred to a new doctor in [place]. Please remove PCP.

## 2021-12-04 NOTE — Telephone Encounter (Signed)
I have removed patient's PCP.

## 2022-01-19 ENCOUNTER — Encounter: Payer: Self-pay | Admitting: Gastroenterology

## 2023-01-10 ENCOUNTER — Encounter (HOSPITAL_COMMUNITY): Payer: Self-pay

## 2023-01-10 ENCOUNTER — Emergency Department (HOSPITAL_COMMUNITY): Payer: 59

## 2023-01-10 ENCOUNTER — Emergency Department (HOSPITAL_COMMUNITY)
Admission: EM | Admit: 2023-01-10 | Discharge: 2023-01-11 | Disposition: A | Discharge status: Home / Self Care | Payer: 59 | Attending: Emergency Medicine | Admitting: Emergency Medicine

## 2023-01-10 ENCOUNTER — Other Ambulatory Visit: Payer: Self-pay

## 2023-01-10 DIAGNOSIS — S63501A Unspecified sprain of right wrist, initial encounter: Secondary | ICD-10-CM | POA: Diagnosis not present

## 2023-01-10 DIAGNOSIS — W19XXXA Unspecified fall, initial encounter: Secondary | ICD-10-CM | POA: Diagnosis not present

## 2023-01-10 DIAGNOSIS — S6991XA Unspecified injury of right wrist, hand and finger(s), initial encounter: Secondary | ICD-10-CM | POA: Diagnosis present

## 2023-01-10 DIAGNOSIS — S63502A Unspecified sprain of left wrist, initial encounter: Secondary | ICD-10-CM | POA: Diagnosis not present

## 2023-01-10 NOTE — ED Provider Notes (Signed)
[facility] Emergency Department Provider Note MRN:  [id number]  Arrival date & time: 01/10/23     Chief Complaint   Fall   History of Present Illness   Roberto Horne is a 62 y.o. year-old male presents to the ED with chief complaint of bilateral wrist pain.  States that he missed a step earlier today and fell forward on both outstretched hands.  He complains of bilateral wrist pain.  He denies any other injuries.  .  History provided by patient.   Review of Systems  Pertinent positive and negative review of systems noted in HPI.    Physical Exam   Vitals:   01/10/23 1931  BP: 127/73  Pulse: 89  Resp: 20  Temp: 98.1 F (36.7 C)  SpO2: 98%    CONSTITUTIONAL:  well-appearing, NAD NEURO:  Alert and oriented x 3, CN 3-12 grossly intact EYES:  eyes equal and reactive ENT/NECK:  Supple, no stridor  CARDIO:  normal rate, appears well-perfused  PULM:  No respiratory distress,  GI/GU:  non-distended,  MSK/SPINE:  No gross deformities, no edema, moves all extremities, normal ROM and strength of bilateral wrists, no deformities, no snuffbox tenderness  SKIN:  no rash, atraumatic   *Additional and/or pertinent findings included in MDM below  Diagnostic and Interventional Summary    EKG Interpretation Date/Time:    Ventricular Rate:    PR Interval:    QRS Duration:    QT Interval:    QTC Calculation:   R Axis:      Text Interpretation:         Labs Reviewed - No data to display  DG Wrist Complete Right  Final Result    DG Wrist Complete Left  Final Result    DG Forearm Left  Final Result      Medications - No data to display   Procedures  /  Critical Care Procedures  ED Course and Medical Decision Making  I have reviewed the triage vital signs, the nursing notes, and pertinent available records from the EMR.  Social Determinants Affecting Complexity of Care: Patient has no clinically significant social determinants  affecting this chief complaint..   ED Course:    Medical Decision Making Patient presents with injury to bilateral wrists.  DDx includes, fracture, strain, or sprain.  Consultants: non  Plain films reveal no obvious fracture.  Questionable scaphoid deformity, but this is noted bilaterally, so I suspect it's baseline anatomy for patient.  Additionally, he doesn't have snuff box tenderness.  Pt advised to follow up with PCP and/or orthopedics. Patient given splints while in ED, conservative therapy such as RICE recommended and discussed.   Patient will be discharged home & is agreeable with above plan. Returns precautions discussed. Pt appears safe for discharge.          Consultants: No consultations were needed in caring for this patient.   Treatment and Plan: Emergency department workup does not suggest an emergent condition requiring admission or immediate intervention beyond  what has been performed at this time. The patient is safe for discharge and has  been instructed to return immediately for worsening symptoms, change in  symptoms or any other concerns    Final Clinical Impressions(s) / ED Diagnoses     ICD-10-CM   1. Fall, initial encounter  W19.XXXA     2. Sprain of left wrist, initial encounter  S63.502A     3. Sprain of right wrist, initial encounter  S63.501A  ED Discharge Orders     None         Discharge Instructions Discussed with and Provided to Patient:   Discharge Instructions   None      Roxy Horseman, PA-C 01/10/23 2341    Mesner, Barbara Cower, MD 01/11/23 [phone]

## 2023-01-10 NOTE — ED Triage Notes (Signed)
Pt coming in for a fall in which he expresses that he got tripped up. Did express any dizziness or lightheadedness at the time of falling. Pt states his hands hurt bilaterally due to falling and bracing himself with his hands while falling. No report LOC, No C-spine/T-spine/L-spine. Secondarily, has not been taking his fluid pill and is retaining fluid due to this.   18g left AC  Medic   173/100 82hr 94%ra 112bgl 20rr

## 2023-01-10 NOTE — ED Provider Triage Note (Signed)
Emergency Medicine Provider Triage Evaluation Note  ALARIC GLADWIN , a 62 y.o. male  was evaluated in triage.  Pt complains of mechanical fall.  Patient reports he tripped on something and fell on outstretched arms.  Denies other injury or acute complaint.  EMS was called..  Review of Systems  Positive: fall Negative: No headache, no neck pain, no chest pain  Physical Exam  BP 127/73 (BP Location: Right Arm)   Pulse 89   Temp 98.1 F (36.7 C) (Oral)   Resp 20   SpO2 98%  Gen:   Awake, no distress   Resp:  Normal effort  MSK:   Moves extremities without difficulty.  No evident deformities of the wrists or hands.  Patient has intact strength and neurovascular function. Other:  Normocephalic atraumatic  Medical Decision Making  Medically screening exam initiated at 7:39 PM.  Appropriate orders placed.  JAZION ATTEBERRY was informed that the remainder of the evaluation will be completed by another provider, this initial triage assessment does not replace that evaluation, and the importance of remaining in the ED until their evaluation is complete.     Arby Barrette, MD 01/10/23 [phone]

## 2023-06-16 ENCOUNTER — Ambulatory Visit (HOSPITAL_COMMUNITY)
Admission: EM | Admit: 2023-06-16 | Discharge: 2023-06-16 | Disposition: A | Discharge status: Home / Self Care | Payer: 59 | Attending: Nurse Practitioner | Admitting: Nurse Practitioner

## 2023-06-16 ENCOUNTER — Encounter (HOSPITAL_COMMUNITY): Payer: Self-pay | Admitting: Emergency Medicine

## 2023-06-16 DIAGNOSIS — J4541 Moderate persistent asthma with (acute) exacerbation: Secondary | ICD-10-CM | POA: Diagnosis not present

## 2023-06-16 MED ORDER — BENZONATATE 100 MG PO CAPS
100.0000 mg | ORAL_CAPSULE | Freq: Three times a day (TID) | ORAL | 0 refills | Status: DC | PRN
Start: 2023-06-16 — End: 2023-10-01

## 2023-06-16 MED ORDER — GUAIFENESIN ER 600 MG PO TB12: 600.0000 mg | ORAL_TABLET | Freq: Two times a day (BID) | ORAL | 0 refills | Status: DC

## 2023-06-16 MED ORDER — BUDESONIDE-FORMOTEROL FUMARATE 160-4.5 MCG/ACT IN AERO: INHALATION_SPRAY | RESPIRATORY_TRACT | 0 refills | Status: DC

## 2023-06-16 MED ORDER — PREDNISONE 20 MG PO TABS: 40.0000 mg | ORAL_TABLET | Freq: Every day | ORAL | 0 refills | Status: AC

## 2023-06-16 NOTE — ED Triage Notes (Signed)
Pt c/o cough, congestion, and Sob since Wednesday.   Pt has been using inhaler and has not been helping

## 2023-06-16 NOTE — ED Provider Notes (Signed)
[facility]    CSN: [id number] Arrival date & time: 06/16/23  1441      History   Chief Complaint Chief Complaint  Patient presents with   Cough    HPI Roberto Horne is a 62 y.o. male.   Patient presents today with 4-day history of chills, congested cough, runny and stuffy nose, headache, decreased appetite, and fatigue.  He denies fever, body aches, shortness of breath or chest pain, sore throat, ear pain, abdominal pain, nausea/vomiting, and diarrhea.  No known sick contacts.  Has been using inhaler and is not helping with symptoms.  Patient reports he has 2 inhalers and he shows me both of them today, they both are albuterol sulfate.  He thought one of them with his daily inhaler.  He does not know how long he has been out of Symbicort for.  He has asthma.    Past Medical History:  Diagnosis Date   Allergy    Arthritis    Asthma    Depression    GERD (gastroesophageal reflux disease)    Gout    Hyperlipemia    Hypertension    Mental impairment    Pneumonia    Sleep apnea     Patient Active Problem List   Diagnosis Date Noted   Acute pain of left knee 07/28/2018   Intellectual disability 12/03/2016   Dental caries 12/03/2016   Obesity 10/01/2016   Left foot pain 08/30/2015   Acute upper respiratory infection 08/30/2015   Nasal polyps 07/21/2015   Contusion, arm, upper 03/01/2015   Hand contusion 03/01/2015   Obstructive sleep apnea 12/16/2014   Seasonal and perennial allergic rhinitis 09/21/2014   Medicare annual wellness visit, subsequent 09/07/2014   CAP (community acquired pneumonia) 07/07/2014   Effusion of right knee 07/07/2014   Hypertension 07/07/2014   Abdominal pain 07/07/2014   Pneumonia 07/07/2014   Asthma, mild persistent 12/15/2012   Gout, unspecified 12/15/2012   Esophageal reflux 12/15/2012   HTN (hypertension) 11/06/2012   Hyperlipidemia 11/06/2012    Past Surgical History:  Procedure Laterality Date   BRAIN SURGERY      FUNCTIONAL ENDOSCOPIC SINUS SURGERY  10/14/2015   SINUS ENDO W/FUSION Bilateral 10/14/2015   Procedure: ENDOSCOPIC SINUS SURGERY WITH NAVIGATION;  Surgeon: Christia Reading, MD;  Location: [facility] OR;  Service: ENT;  Laterality: Bilateral;       Home Medications    Prior to Admission medications   Medication Sig Start Date End Date Taking? Authorizing Provider  benzonatate (TESSALON) 100 MG capsule Take 1 capsule (100 mg total) by mouth 3 (three) times daily as needed for cough. Do not take with alcohol or while driving or operating heavy machinery.  May cause drowsiness. 06/16/23  Yes Valentino Nose, NP  guaiFENesin (MUCINEX) 600 MG 12 hr tablet Take 1 tablet (600 mg total) by mouth 2 (two) times daily. 06/16/23  Yes Valentino Nose, NP  predniSONE (DELTASONE) 20 MG tablet Take 2 tablets (40 mg total) by mouth daily with breakfast for 5 days. 06/16/23 06/21/23 Yes Valentino Nose, NP  albuterol (VENTOLIN HFA) 108 (90 Base) MCG/ACT inhaler Inhale 2 puffs into the lungs every 4 (four) hours as needed for wheezing or shortness of breath. 05/17/21   Olive Bass, FNP  allopurinol (ZYLOPRIM) 100 MG tablet Take 1 tablet (100 mg total) by mouth daily. 02/17/21   Olive Bass, FNP  budesonide-formoterol (SYMBICORT) 160-4.5 MCG/ACT inhaler TAKE 2 PUFFS BY MOUTH TWICE A DAY 06/16/23  Cathlean Marseilles A, NP  colchicine 0.6 MG tablet TAKE 1 TABLET (0.6 MG TOTAL) BY MOUTH DAILY AS NEEDED (GOUT PAIN). PT NEEDS AN APPOINTMENT. 09/18/21   Rodolph Bong, MD  fluticasone (FLONASE) 50 MCG/ACT nasal spray PLACE 1 SPRAY INTO BOTH NOSTRILS DAILY. 02/17/21   Olive Bass, FNP  metoprolol succinate (TOPROL-XL) 25 MG 24 hr tablet Take 1 tablet (25 mg total) by mouth daily. 02/17/21   Olive Bass, FNP  morphine (MSIR) 15 MG tablet Take 0.5 tablets (7.5 mg total) by mouth every 4 (four) hours as needed for severe pain. Patient not taking: Reported on 06/16/2023 05/18/21    Melene Plan, DO  omeprazole (PRILOSEC) 20 MG capsule Take 20 mg by mouth daily.    [provider]  OVER THE COUNTER MEDICATION Apply 1 application topically daily as needed (muscle pain). Pt states he uses an otc muscle rub that has a horse on the package    [provider]  rosuvastatin (CRESTOR) 10 MG tablet Take 1 tablet (10 mg total) by mouth daily. 02/17/21   Olive Bass, FNP  Spacer/Aero-Holding Deretha Emory (AEROCHAMBER MV) inhaler Use as instructed 08/21/17   Waymon Budge, MD    Family History Family History  Problem Relation Age of Onset   Arthritis Mother    Hyperlipidemia Mother    Hypertension Mother    Diabetes Mother    Stomach cancer Mother        possible, not 100 % sure   Kidney disease Mother    Arthritis Father    Hypertension Father    Diabetes Father    Asthma Maternal Grandmother    Rectal cancer Neg Hx    Colon cancer Neg Hx     Social History Social History   Tobacco Use   Smoking status: Never   Smokeless tobacco: Never  Vaping Use   Vaping status: Never Used  Substance Use Topics   Alcohol use: No   Drug use: No     Allergies   Ibuprofen   Review of Systems Review of Systems Per HPI  Physical Exam Triage Vital Signs ED Triage Vitals [06/16/23 1510]  Encounter Vitals Group     BP (!) 135/95     Systolic BP Percentile      Diastolic BP Percentile      Pulse Rate 85     Resp 20     Temp 98 F (36.7 C)     Temp Source Oral     SpO2 96 %     Weight      Height      Head Circumference      Peak Flow      Pain Score 8     Pain Loc      Pain Education      Exclude from Growth Chart    No data found.  Updated Vital Signs BP (!) 135/95   Pulse 85   Temp 98 F (36.7 C) (Oral)   Resp 20   SpO2 96%   Visual Acuity Right Eye Distance:   Left Eye Distance:   Bilateral Distance:    Right Eye Near:   Left Eye Near:    Bilateral Near:     Physical Exam Vitals and nursing note reviewed.   Constitutional:      General: He is not in acute distress.    Appearance: Normal appearance. He is not ill-appearing or toxic-appearing.  HENT:     Head: Normocephalic and  atraumatic.     Right Ear: Tympanic membrane, ear canal and external ear normal.     Left Ear: Tympanic membrane, ear canal and external ear normal.     Nose: No congestion or rhinorrhea.     Mouth/Throat:     Mouth: Mucous membranes are moist.     Pharynx: Oropharynx is clear. No oropharyngeal exudate or posterior oropharyngeal erythema.  Eyes:     General: No scleral icterus.    Extraocular Movements: Extraocular movements intact.  Cardiovascular:     Rate and Rhythm: Normal rate and regular rhythm.  Pulmonary:     Effort: Pulmonary effort is normal. No respiratory distress.     Breath sounds: Wheezing present. No rhonchi or rales.  Musculoskeletal:     Cervical back: Normal range of motion and neck supple.  Lymphadenopathy:     Cervical: No cervical adenopathy.  Skin:    General: Skin is warm and dry.     Coloration: Skin is not jaundiced or pale.     Findings: No erythema or rash.  Neurological:     Mental Status: He is alert and oriented to person, place, and time.  Psychiatric:        Behavior: Behavior is cooperative.      UC Treatments / Results  Labs (all labs ordered are listed, but only abnormal results are displayed) Labs Reviewed - No data to display  EKG   Radiology No results found.  Procedures Procedures (including critical care time)  Medications Ordered in UC Medications - No data to display  Initial Impression / Assessment and Plan / UC Course  I have reviewed the triage vital signs and the nursing notes.  Pertinent labs & imaging results that were available during my care of the patient were reviewed by me and considered in my medical decision making (see chart for details).   Patient is well-appearing, normotensive, afebrile, not tachycardic, not tachypneic,  oxygenating well on room air.    1. Moderate persistent asthma with exacerbation Resume Symbicort twice daily, continue albuterol every 4-6 hours as needed for wheezing or shortness of breath In addition given wheezing, will start oral prednisone 40 mg daily for 5 days Other supportive care discussed including Mucinex, cough suppressant medication Return and ER precautions discussed with patient  The patient was given the opportunity to ask questions.  All questions answered to their satisfaction.  The patient is in agreement to this plan.   Final Clinical Impressions(s) / UC Diagnoses   Final diagnoses:  Moderate persistent asthma with exacerbation     Discharge Instructions      You have a viral upper respiratory infection that is causing your asthma to flare up.  Resume Symbicort twice daily and rinse mouth after each use.  Start oral prednisone, continue albuterol inhaler every 4-6 hours as needed.  Symptoms should improve over the next week to 10 days.  If you develop chest pain or shortness of breath, go to the emergency room.  Some things that can make you feel better are: - Increased rest - Increasing fluid with water/sugar free electrolytes - Acetaminophen and ibuprofen as needed for fever/pain - Salt water gargling, chloraseptic spray and throat lozenges - OTC guaifenesin (Mucinex) 600 mg twice daily - Saline sinus flushes or a neti pot - Humidifying the air -Tessalon Perles every 8 hours as needed for dry cough      ED Prescriptions     Medication Sig Dispense Auth. Provider   budesonide-formoterol (SYMBICORT) 160-4.5 MCG/ACT  inhaler TAKE 2 PUFFS BY MOUTH TWICE A DAY 30.6 each Cathlean Marseilles A, NP   predniSONE (DELTASONE) 20 MG tablet Take 2 tablets (40 mg total) by mouth daily with breakfast for 5 days. 10 tablet Cathlean Marseilles A, NP   guaiFENesin (MUCINEX) 600 MG 12 hr tablet Take 1 tablet (600 mg total) by mouth 2 (two) times daily. 60 tablet Cathlean Marseilles A, NP   benzonatate (TESSALON) 100 MG capsule Take 1 capsule (100 mg total) by mouth 3 (three) times daily as needed for cough. Do not take with alcohol or while driving or operating heavy machinery.  May cause drowsiness. 21 capsule Valentino Nose, NP      PDMP not reviewed this encounter.   Valentino Nose, NP 06/17/23 [phone]

## 2023-06-16 NOTE — Discharge Instructions (Signed)
You have a viral upper respiratory infection that is causing your asthma to flare up.  Resume Symbicort twice daily and rinse mouth after each use.  Start oral prednisone, continue albuterol inhaler every 4-6 hours as needed.  Symptoms should improve over the next week to 10 days.  If you develop chest pain or shortness of breath, go to the emergency room.  Some things that can make you feel better are: - Increased rest - Increasing fluid with water/sugar free electrolytes - Acetaminophen and ibuprofen as needed for fever/pain - Salt water gargling, chloraseptic spray and throat lozenges - OTC guaifenesin (Mucinex) 600 mg twice daily - Saline sinus flushes or a neti pot - Humidifying the air -Tessalon Perles every 8 hours as needed for dry cough

## 2023-08-21 ENCOUNTER — Other Ambulatory Visit: Payer: Self-pay | Admitting: Nurse Practitioner

## 2023-08-21 DIAGNOSIS — R4189 Other symptoms and signs involving cognitive functions and awareness: Secondary | ICD-10-CM

## 2023-09-11 ENCOUNTER — Other Ambulatory Visit: Payer: Self-pay | Admitting: Nurse Practitioner

## 2023-09-12 NOTE — Telephone Encounter (Signed)
 Requested by interface [email or web address]. Provider not at this practice.  Requested Prescriptions  Refused Prescriptions Disp Refills   budesonide-formoterol (SYMBICORT) 160-4.5 MCG/ACT inhaler [Pharmacy Med Name: SYMBICORT 160-4.5 MCG INHALER] 30.6 each 0    Sig: TAKE 2 PUFFS BY MOUTH TWICE A DAY     Pulmonology:  Combination Products Failed - 09/12/2023  9:48 AM      Failed - Valid encounter within last 12 months    Recent Outpatient Visits   None

## 2023-09-13 ENCOUNTER — Other Ambulatory Visit: Payer: Self-pay

## 2023-09-13 ENCOUNTER — Emergency Department (HOSPITAL_COMMUNITY)
Admission: EM | Admit: 2023-09-13 | Discharge: 2023-09-13 | Disposition: A | Discharge status: Home / Self Care | Attending: Emergency Medicine | Admitting: Emergency Medicine

## 2023-09-13 ENCOUNTER — Emergency Department (HOSPITAL_COMMUNITY)

## 2023-09-13 ENCOUNTER — Encounter (HOSPITAL_COMMUNITY): Payer: Self-pay

## 2023-09-13 DIAGNOSIS — M25572 Pain in left ankle and joints of left foot: Secondary | ICD-10-CM | POA: Diagnosis present

## 2023-09-13 DIAGNOSIS — I10 Essential (primary) hypertension: Secondary | ICD-10-CM

## 2023-09-13 DIAGNOSIS — R030 Elevated blood-pressure reading, without diagnosis of hypertension: Secondary | ICD-10-CM

## 2023-09-13 DIAGNOSIS — Z79899 Other long term (current) drug therapy: Secondary | ICD-10-CM | POA: Insufficient documentation

## 2023-09-13 DIAGNOSIS — W010XXA Fall on same level from slipping, tripping and stumbling without subsequent striking against object, initial encounter: Secondary | ICD-10-CM | POA: Diagnosis not present

## 2023-09-13 MED ORDER — ACETAMINOPHEN 500 MG PO TABS
1000.0000 mg | ORAL_TABLET | Freq: Once | ORAL | Status: AC
Start: 2023-09-13 — End: 2023-09-13
  Administered 2023-09-13: 1000 mg via ORAL
  Filled 2023-09-13: qty 2

## 2023-09-13 NOTE — ED Provider Triage Note (Signed)
 Emergency Medicine Provider Triage Evaluation Note  See H and Nigel Sloop, MD 09/13/23 1517

## 2023-09-13 NOTE — Discharge Instructions (Signed)
 It was our pleasure to provide your ER care today - we hope that you feel better.  Fall precautions.  Take acetaminophen or ibuprofen as need.   Your blood pressure is high today - continue your meds, follow heart healthy eating plan, and follow up closely with primary care doctor in the next 1-2 weeks.  Return to ER if worse, new symptoms, new/severe pain, severe headache, chest pain, trouble breathing, or other concern.

## 2023-09-13 NOTE — ED Triage Notes (Signed)
 Pt states he fell Tuesday (mechanical) and hurts all over; denies LOC; pt had cognitive impairment at baseline, is a poor historian; states he was dropped off by his sister in law

## 2023-09-13 NOTE — ED Provider Notes (Signed)
 [place] EMERGENCY DEPARTMENT AT [facility] Provider Note   CSN: [id number] Arrival date & time: 09/13/23  1445     History  Chief Complaint  Patient presents with   Roberto Horne    ARVID MARENGO is a 63 y.o. male.  Pt with c/o fall three days ago. States tripped and fell. C/o 'pain all over' since. Pt limited historian - level 5 caveat. Pt indicates main pain is left ankle. Has aso brace on. Has been ambulatory. No loc. No faintness or dizziness prior to fall. No headache. No neck pain or back pain. No chest pain or sob. No abd pain or nv. No other focal extremity pain or injury. Skin intact. No anticoagulant use.   The history is provided by the patient and medical records.  Fall Pertinent negatives include no chest pain, no abdominal pain, no headaches and no shortness of breath.       Home Medications Prior to Admission medications   Medication Sig Start Date End Date Taking? Authorizing Provider  albuterol (VENTOLIN HFA) 108 (90 Base) MCG/ACT inhaler Inhale 2 puffs into the lungs every 4 (four) hours as needed for wheezing or shortness of breath. 05/17/21   Olive Bass, FNP  allopurinol (ZYLOPRIM) 100 MG tablet Take 1 tablet (100 mg total) by mouth daily. 02/17/21   Olive Bass, FNP  benzonatate (TESSALON) 100 MG capsule Take 1 capsule (100 mg total) by mouth 3 (three) times daily as needed for cough. Do not take with alcohol or while driving or operating heavy machinery.  May cause drowsiness. 06/16/23   Valentino Nose, NP  budesonide-formoterol (SYMBICORT) 160-4.5 MCG/ACT inhaler TAKE 2 PUFFS BY MOUTH TWICE A DAY 06/16/23   Cathlean Marseilles A, NP  colchicine 0.6 MG tablet TAKE 1 TABLET (0.6 MG TOTAL) BY MOUTH DAILY AS NEEDED (GOUT PAIN). PT NEEDS AN APPOINTMENT. 09/18/21   Rodolph Bong, MD  fluticasone (FLONASE) 50 MCG/ACT nasal spray PLACE 1 SPRAY INTO BOTH NOSTRILS DAILY. 02/17/21   Olive Bass, FNP  guaiFENesin (MUCINEX) 600 MG  12 hr tablet Take 1 tablet (600 mg total) by mouth 2 (two) times daily. 06/16/23   Valentino Nose, NP  metoprolol succinate (TOPROL-XL) 25 MG 24 hr tablet Take 1 tablet (25 mg total) by mouth daily. 02/17/21   Olive Bass, FNP  morphine (MSIR) 15 MG tablet Take 0.5 tablets (7.5 mg total) by mouth every 4 (four) hours as needed for severe pain. Patient not taking: Reported on 06/16/2023 05/18/21   Melene Plan, DO  omeprazole (PRILOSEC) 20 MG capsule Take 20 mg by mouth daily.    [provider]  OVER THE COUNTER MEDICATION Apply 1 application topically daily as needed (muscle pain). Pt states he uses an otc muscle rub that has a horse on the package    [provider]  rosuvastatin (CRESTOR) 10 MG tablet Take 1 tablet (10 mg total) by mouth daily. 02/17/21   Olive Bass, FNP  Spacer/Aero-Holding Chambers (AEROCHAMBER MV) inhaler Use as instructed 08/21/17   Waymon Budge, MD      Allergies    Ibuprofen    Review of Systems   Review of Systems  Constitutional:  Negative for chills and fever.  HENT:  Negative for nosebleeds.   Eyes:  Negative for pain, redness and visual disturbance.  Respiratory:  Negative for cough and shortness of breath.   Cardiovascular:  Negative for chest pain.  Gastrointestinal:  Negative for abdominal pain, nausea  and vomiting.  Genitourinary:  Negative for dysuria and flank pain.  Musculoskeletal:  Negative for back pain and neck pain.  Skin:  Negative for wound.  Neurological:  Negative for weakness, numbness and headaches.    Physical Exam Updated Vital Signs BP (!) 151/97 (BP Location: Left Arm)   Pulse 80   Temp 98 F (36.7 C)   Resp 16   SpO2 98%  Physical Exam Vitals and nursing note reviewed.  Constitutional:      Appearance: Normal appearance. He is well-developed.  HENT:     Head: Atraumatic.     Comments: No facial or scalp bruising, contusion or swelling noted.     Nose: Nose normal.      Mouth/Throat:     Mouth: Mucous membranes are moist.     Pharynx: Oropharynx is clear.  Eyes:     General: No scleral icterus.    Conjunctiva/sclera: Conjunctivae normal.     Pupils: Pupils are equal, round, and reactive to light.  Neck:     Trachea: No tracheal deviation.  Cardiovascular:     Rate and Rhythm: Normal rate and regular rhythm.     Pulses: Normal pulses.     Heart sounds: Normal heart sounds. No murmur heard.    No friction rub. No gallop.  Pulmonary:     Effort: Pulmonary effort is normal. No accessory muscle usage or respiratory distress.     Breath sounds: Normal breath sounds.  Chest:     Chest wall: No tenderness.  Abdominal:     General: There is no distension.     Palpations: Abdomen is soft.     Tenderness: There is no abdominal tenderness.  Musculoskeletal:        General: No swelling.     Cervical back: Normal range of motion and neck supple. No rigidity.     Comments: CTLS spine, non tender, aligned, no step off. Tenderness lat/med malleolus left ankle. Ankle grossly stable. No erythema or effusion. Otherwise good rom bil extremities without pain or other focal bony tenderness.   Skin:    General: Skin is warm and dry.     Findings: No rash.  Neurological:     Mental Status: He is alert.     Comments: Alert, speech clear. Gcs 15. Motor/sens grossly intact bil. Stre 5/5. Sens intact.   Psychiatric:        Mood and Affect: Mood normal.     ED Results / Procedures / Treatments   Labs (all labs ordered are listed, but only abnormal results are displayed)   EKG None  Radiology DG Ankle Complete Left Result Date: 09/13/2023 CLINICAL DATA:  Fall on Tuesday with pain. EXAM: LEFT ANKLE COMPLETE - 3+ VIEW COMPARISON:  None Available. FINDINGS: There is no evidence of fracture, dislocation, or joint effusion. Mild degenerative talonavicular and tibial talar spurring. Minimal chronic spurring of the medial malleolus. No erosive change. Generalized soft  tissue edema. IMPRESSION: Soft tissue edema. No acute fracture or subluxation of the left ankle. Electronically Signed   By: Narda Rutherford M.D.   On: 09/13/2023 17:17    Procedures Procedures    Medications Ordered in ED Medications  acetaminophen (TYLENOL) tablet 1,000 mg (1,000 mg Oral Given 09/13/23 1654)    ED Course/ Medical Decision Making/ A&P                                 Medical Decision  Making Problems Addressed: Acute left ankle pain: acute illness or injury Elevated blood pressure reading: acute illness or injury Essential hypertension: chronic illness or injury with exacerbation, progression, or side effects of treatment that poses a threat to life or bodily functions Fall from slip, trip, or stumble, initial encounter: acute illness or injury with systemic symptoms that poses a threat to life or bodily functions  Amount and/or Complexity of Data Reviewed External Data Reviewed: notes. Radiology: ordered and independent interpretation performed. Decision-making details documented in ED Course.  Risk OTC drugs.  Xrays.   Reviewed nursing notes and prior charts for additional history.   Pt denies any meds pta. Acetaminophen po.   Xrays reviewed/interpreted by me - no fx.   Pt already has aso brace.   Pt appears stable for d/c.         Final Clinical Impression(s) / ED Diagnoses Final diagnoses:  None    Rx / DC Orders ED Discharge Orders     None         Cathren Laine, MD 09/13/23 1754

## 2023-09-13 NOTE — ED Notes (Signed)
 Dalene Carrow called to pick patient up

## 2023-09-13 NOTE — ED Notes (Signed)
 Please call Nichole Keltner at [phone] for patient pick-up.  She will call back if she does not hear anything by 9pm.

## 2023-09-28 ENCOUNTER — Other Ambulatory Visit

## 2023-09-30 ENCOUNTER — Emergency Department (HOSPITAL_COMMUNITY)

## 2023-09-30 ENCOUNTER — Encounter (HOSPITAL_COMMUNITY): Payer: Self-pay

## 2023-09-30 ENCOUNTER — Emergency Department (HOSPITAL_COMMUNITY)
Admission: EM | Admit: 2023-09-30 | Discharge: 2023-10-02 | Disposition: A | Discharge status: Home / Self Care | Attending: Emergency Medicine | Admitting: Emergency Medicine

## 2023-09-30 DIAGNOSIS — J45909 Unspecified asthma, uncomplicated: Secondary | ICD-10-CM | POA: Diagnosis not present

## 2023-09-30 DIAGNOSIS — L89321 Pressure ulcer of left buttock, stage 1: Secondary | ICD-10-CM | POA: Diagnosis not present

## 2023-09-30 DIAGNOSIS — M1990 Unspecified osteoarthritis, unspecified site: Secondary | ICD-10-CM

## 2023-09-30 DIAGNOSIS — Z7951 Long term (current) use of inhaled steroids: Secondary | ICD-10-CM | POA: Diagnosis not present

## 2023-09-30 DIAGNOSIS — M79605 Pain in left leg: Secondary | ICD-10-CM | POA: Insufficient documentation

## 2023-09-30 DIAGNOSIS — Z79899 Other long term (current) drug therapy: Secondary | ICD-10-CM | POA: Insufficient documentation

## 2023-09-30 DIAGNOSIS — I10 Essential (primary) hypertension: Secondary | ICD-10-CM | POA: Insufficient documentation

## 2023-09-30 LAB — URINALYSIS, ROUTINE W REFLEX MICROSCOPIC
Bilirubin Urine: NEGATIVE
Glucose, UA: NEGATIVE mg/dL
Hgb urine dipstick: NEGATIVE
Ketones, ur: NEGATIVE mg/dL
Leukocytes,Ua: NEGATIVE
Nitrite: NEGATIVE
Protein, ur: NEGATIVE mg/dL
Specific Gravity, Urine: 1.019 (ref 1.005–1.030)
pH: 5 (ref 5.0–8.0)

## 2023-09-30 LAB — COMPREHENSIVE METABOLIC PANEL WITH GFR
ALT: 37 U/L (ref 0–44)
AST: 93 U/L — ABNORMAL HIGH (ref 15–41)
Albumin: 2.8 g/dL — ABNORMAL LOW (ref 3.5–5.0)
Alkaline Phosphatase: 41 U/L (ref 38–126)
Anion gap: 10 (ref 5–15)
BUN: 49 mg/dL — ABNORMAL HIGH (ref 8–23)
CO2: 25 mmol/L (ref 22–32)
Calcium: 8.7 mg/dL — ABNORMAL LOW (ref 8.9–10.3)
Chloride: 99 mmol/L (ref 98–111)
Creatinine, Ser: 1.03 mg/dL (ref 0.61–1.24)
GFR, Estimated: >60 mL/min (ref >60)
Glucose, Bld: 97 mg/dL (ref 70–99)
Potassium: 3.5 mmol/L (ref 3.5–5.1)
Sodium: 134 mmol/L — ABNORMAL LOW (ref 135–145)
Total Bilirubin: 0.6 mg/dL (ref 0.0–1.2)
Total Protein: 7.6 g/dL (ref 6.5–8.1)

## 2023-09-30 LAB — CBC WITH DIFFERENTIAL/PLATELET
Abs Immature Granulocytes: 0.05 10*3/uL (ref 0.00–0.07)
Basophils Absolute: 0 10*3/uL (ref 0.0–0.1)
Basophils Relative: 0 %
Eosinophils Absolute: 0.3 10*3/uL (ref 0.0–0.5)
Eosinophils Relative: 3 %
HCT: 39 % (ref 39.0–52.0)
Hemoglobin: 12.1 g/dL — ABNORMAL LOW (ref 13.0–17.0)
Immature Granulocytes: 1 %
Lymphocytes Relative: 26 %
Lymphs Abs: 2.7 10*3/uL (ref 0.7–4.0)
MCH: 23.8 pg — ABNORMAL LOW (ref 26.0–34.0)
MCHC: 31 g/dL (ref 30.0–36.0)
MCV: 76.6 fL — ABNORMAL LOW (ref 80.0–100.0)
Monocytes Absolute: 1.2 10*3/uL — ABNORMAL HIGH (ref 0.1–1.0)
Monocytes Relative: 12 %
Neutro Abs: 6.2 10*3/uL (ref 1.7–7.7)
Neutrophils Relative %: 58 %
Platelets: 230 10*3/uL (ref 150–400)
RBC: 5.09 MIL/uL (ref 4.22–5.81)
RDW: 15.2 % (ref 11.5–15.5)
WBC: 10.5 10*3/uL (ref 4.0–10.5)
nRBC: 0 % (ref 0.0–0.2)

## 2023-09-30 MED ORDER — OXYCODONE HCL 5 MG PO TABS
5.0000 mg | ORAL_TABLET | Freq: Once | ORAL | Status: AC
  Administered 2023-09-30: 5 mg via ORAL
  Filled 2023-09-30: qty 1

## 2023-09-30 MED ORDER — KETOROLAC TROMETHAMINE 15 MG/ML IJ SOLN
15.0000 mg | Freq: Once | INTRAMUSCULAR | Status: AC
  Administered 2023-09-30: 15 mg via INTRAMUSCULAR
  Filled 2023-09-30: qty 1

## 2023-09-30 MED ORDER — FENTANYL CITRATE PF 50 MCG/ML IJ SOSY
50.0000 ug | PREFILLED_SYRINGE | Freq: Once | INTRAMUSCULAR | Status: AC
  Administered 2023-09-30: 50 ug via INTRAVENOUS
  Filled 2023-09-30: qty 1

## 2023-09-30 MED ORDER — COLCHICINE 0.6 MG PO TABS
0.6000 mg | ORAL_TABLET | Freq: Once | ORAL | Status: AC
  Administered 2023-09-30: 0.6 mg via ORAL
  Filled 2023-09-30: qty 1

## 2023-09-30 MED ORDER — MORPHINE SULFATE (PF) 4 MG/ML IV SOLN
4.0000 mg | Freq: Once | INTRAVENOUS | Status: AC
  Administered 2023-09-30: 4 mg via INTRAVENOUS
  Filled 2023-09-30: qty 1

## 2023-09-30 MED ORDER — PREDNISONE 10 MG (21) PO TBPK: ORAL_TABLET | Freq: Every day | ORAL | 0 refills | Status: AC

## 2023-09-30 MED ORDER — PREDNISONE 20 MG PO TABS
60.0000 mg | ORAL_TABLET | Freq: Once | ORAL | Status: AC
Start: 2023-09-30 — End: 2023-09-30
  Administered 2023-09-30: 60 mg via ORAL
  Filled 2023-09-30: qty 3

## 2023-09-30 MED ORDER — COLCHICINE 0.6 MG PO TABS
ORAL_TABLET | ORAL | 0 refills | Status: AC
Start: 2023-09-30 — End: ?

## 2023-09-30 MED ORDER — COLCHICINE 0.6 MG PO TABS
1.2000 mg | ORAL_TABLET | Freq: Once | ORAL | Status: AC
  Administered 2023-09-30: 1.2 mg via ORAL
  Filled 2023-09-30: qty 2

## 2023-09-30 NOTE — ED Notes (Addendum)
 Patient brought back to room. States he is unable to get up. Patient refused to attempt to stand on feet. He got his right hip from chair onto bed then nurses picked his feet up to help him get on bed. Patient pulled up in bed. Clothing removed pants have urine and feces noted. Large feces noted in boxer, patient's pants and shirt placed in belongings bag, patient ok with throwing away boxers that has feces attached to them. Patient states he lives with family but he takes care of himself. Patient admits to urinating several times on himself.  Patient admits to not following a gout diet and believes he is in pain due to gout.

## 2023-09-30 NOTE — Consult Note (Signed)
 Initial Consultation Note   Patient: Roberto Horne WJX:[id number] DOB: 28-Dec-1960 PCP: Pcp, No DOA: 09/30/2023 DOS: the patient was seen and examined on 09/30/2023 Primary service: Benjiman Core, MD  Referring physician: Jamison Oka  Reason for consult: left knee arthritis with uncontrolled pain.  Assessment/Plan: Assessment and Plan: Arthritis Left knee arthritis with mild-moderate effusion. No systemic toxicity, fever, wbc. Good response to Rx in the ER patient has documented excellent range of motion . Xray reassuring.   My concern for septic arthritis is very low. Concern for osteoarthritis vs. Mild gout flare. Good response to Rx , in Er. C.w. colchicine as tolerated. Attempt ambulation. If any concern, consider orthopedic eval for question of  simultaneous joint tap and lidocaine/steroid injection for comfort.       TRH will sign off at present, please call us again when needed.  HPI: Roberto Horne is a 63 y.o. male with past medical history of asthma, HTN as well as "gout" that has been previously diagnosed via arthrocentesis (per ER report). Ptinet reports having a fall about 2 weeks ago with persistnt left knee pain since then. With apparently inability to tolerate activities of daily living at home. Patinet presents to ER today with left more than right knee pain. Patinet is s.p. colchicine, ketorolac and fentanyl and morpihne and oxycodone as well as prednisone. Attempt at ambulation apparently failed , medical eval is sought.  Patinet provided me the hisotry as mentioned above. No fever, rigor, penetrating injury. Patinet is concerned about knee looking big.  Review of Systems: As mentioned in the history of present illness. All other systems reviewed and are negative. Past Medical History:  Diagnosis Date   Allergy    Arthritis    Asthma    Depression    GERD (gastroesophageal reflux disease)    Gout    Hyperlipemia    Hypertension    Mental impairment     Pneumonia    Sleep apnea    Past Surgical History:  Procedure Laterality Date   BRAIN SURGERY     FUNCTIONAL ENDOSCOPIC SINUS SURGERY  10/14/2015   SINUS ENDO W/FUSION Bilateral 10/14/2015   Procedure: ENDOSCOPIC SINUS SURGERY WITH NAVIGATION;  Surgeon: Christia Reading, MD;  Location: [facility] OR;  Service: ENT;  Laterality: Bilateral;   Social History:  reports that he has never smoked. He has never used smokeless tobacco. He reports that he does not drink alcohol and does not use drugs.  Allergies  Allergen Reactions   Ibuprofen Itching    Family History  Problem Relation Age of Onset   Arthritis Mother    Hyperlipidemia Mother    Hypertension Mother    Diabetes Mother    Stomach cancer Mother        possible, not 100 % sure   Kidney disease Mother    Arthritis Father    Hypertension Father    Diabetes Father    Asthma Maternal Grandmother    Rectal cancer Neg Hx    Colon cancer Neg Hx     Prior to Admission medications   Medication Sig Start Date End Date Taking? Authorizing Provider  albuterol (VENTOLIN HFA) 108 (90 Base) MCG/ACT inhaler Inhale 2 puffs into the lungs every 4 (four) hours as needed for wheezing or shortness of breath. 05/17/21  Yes Olive Bass, FNP  allopurinol (ZYLOPRIM) 100 MG tablet Take 1 tablet (100 mg total) by mouth daily. 02/17/21  Yes Olive Bass, FNP  budesonide-formoterol [facility]) 160-4.5 MCG/ACT inhaler TAKE 2  PUFFS BY MOUTH TWICE A DAY 06/16/23  Yes Cathlean Marseilles A, NP  colchicine 0.6 MG tablet TAKE 1 TABLET (0.6 MG TOTAL) BY MOUTH DAILY AS NEEDED (GOUT PAIN). Pt needs an appointment. 09/30/23  Yes Halford Decamp, PA-C  metoprolol succinate (TOPROL-XL) 25 MG 24 hr tablet Take 1 tablet (25 mg total) by mouth daily. 02/17/21  Yes Olive Bass, FNP  omeprazole (PRILOSEC) 20 MG capsule Take 20 mg by mouth daily.   Yes [provider]  predniSONE (STERAPRED UNI-PAK 21 TAB) 10 MG (21) TBPK tablet Take by  mouth daily. Take 6 tabs by mouth daily  for 2 days, then 5 tabs for 2 days, then 4 tabs for 2 days, then 3 tabs for 2 days, 2 tabs for 2 days, then 1 tab by mouth daily for 2 days 09/30/23  Yes Halford Decamp, PA-C  rosuvastatin (CRESTOR) 10 MG tablet Take 1 tablet (10 mg total) by mouth daily. 02/17/21  Yes Olive Bass, FNP  benzonatate (TESSALON) 100 MG capsule Take 1 capsule (100 mg total) by mouth 3 (three) times daily as needed for cough. Do not take with alcohol or while driving or operating heavy machinery.  May cause drowsiness. 06/16/23   Valentino Nose, NP  desonide (DESOWEN) 0.05 % ointment Apply 1 Application topically as directed. 08/22/23   [provider]  fluticasone (FLONASE) 50 MCG/ACT nasal spray PLACE 1 SPRAY INTO BOTH NOSTRILS DAILY. 02/17/21   Olive Bass, FNP  guaiFENesin (MUCINEX) 600 MG 12 hr tablet Take 1 tablet (600 mg total) by mouth 2 (two) times daily. 06/16/23   Valentino Nose, NP  morphine (MSIR) 15 MG tablet Take 0.5 tablets (7.5 mg total) by mouth every 4 (four) hours as needed for severe pain. Patient not taking: Reported on 06/16/2023 05/18/21   Melene Plan, DO  OVER THE COUNTER MEDICATION Apply 1 application topically daily as needed (muscle pain). Pt states he uses an otc muscle rub that has a horse on the package    [provider]  Spacer/Aero-Holding Chambers (AEROCHAMBER MV) inhaler Use as instructed 08/21/17   Jetty Duhamel D, MD    Physical Exam: Vitals:   09/30/23 2115 09/30/23 2200 09/30/23 2300 09/30/23 2330  BP: 119/86 129/88 127/76 129/84  Pulse: 93 90 80 80  Resp:  17 17 17   Temp:  98.5 F (36.9 C)    TempSrc:  Oral    SpO2: 97% 93% 97% 93%   General - patient is alertt and awake, man of little words. Resp - b/l a/e vsicular Cvs-s1s2 normla Abdomen - soft non tender Extremity - warm no edmea.  Mild left knee effusion most apparen in the supero-lateral aspect. No erythema, no tenderenss to  firm palpation. Knee extended fully and flexed to 45  dgrees. See photos:   Media Information  Document Information  Photos  Both knees  09/30/2023 23:56  Attached To:  Hospital Encounter on 09/30/23  Source Information  Nolberto Hanlon, MD  [facility]  Document History      Media Information  Document Information  Photos  Left knee  09/30/2023 23:55  Attached To:  Hospital Encounter on 09/30/23  Source Information  Nolberto Hanlon, MD  [facility]  Document History     Data Reviewed:   Labs on Admission:  Results for orders placed or performed during the hospital encounter of 09/30/23 (from the past 24 hours)  CBC with Differential     Status: Abnormal  Collection Time: 09/30/23  6:33 PM  Result Value Ref Range   WBC 10.5 4.0 - 10.5 K/uL   RBC 5.09 4.22 - 5.81 MIL/uL   Hemoglobin 12.1 (L) 13.0 - 17.0 g/dL   HCT 30.8 65.7 - 84.6 %   MCV 76.6 (L) 80.0 - 100.0 fL   MCH 23.8 (L) 26.0 - 34.0 pg   MCHC 31.0 30.0 - 36.0 g/dL   RDW 96.2 95.2 - 84.1 %   Platelets 230 150 - 400 K/uL   nRBC 0.0 0.0 - 0.2 %   Neutrophils Relative % 58 %   Neutro Abs 6.2 1.7 - 7.7 K/uL   Lymphocytes Relative 26 %   Lymphs Abs 2.7 0.7 - 4.0 K/uL   Monocytes Relative 12 %   Monocytes Absolute 1.2 (H) 0.1 - 1.0 K/uL   Eosinophils Relative 3 %   Eosinophils Absolute 0.3 0.0 - 0.5 K/uL   Basophils Relative 0 %   Basophils Absolute 0.0 0.0 - 0.1 K/uL   Immature Granulocytes 1 %   Abs Immature Granulocytes 0.05 0.00 - 0.07 K/uL  Comprehensive metabolic panel with GFR     Status: Abnormal   Collection Time: 09/30/23  8:16 PM  Result Value Ref Range   Sodium 134 (L) 135 - 145 mmol/L   Potassium 3.5 3.5 - 5.1 mmol/L   Chloride 99 98 - 111 mmol/L   CO2 25 22 - 32 mmol/L   Glucose, Bld 97 70 - 99 mg/dL   BUN 49 (H) 8 - 23 mg/dL   Creatinine, Ser 3.24 0.61 - 1.24 mg/dL   Calcium 8.7 (L) 8.9 - 10.3 mg/dL   Total Protein 7.6 6.5 - 8.1 g/dL   Albumin 2.8 (L) 3.5 - 5.0 g/dL   AST 93  (H) 15 - 41 U/L   ALT 37 0 - 44 U/L   Alkaline Phosphatase 41 38 - 126 U/L   Total Bilirubin 0.6 0.0 - 1.2 mg/dL   GFR, Estimated >40 >10 mL/min   Anion gap 10 5 - 15  Urinalysis, Routine w reflex microscopic -Urine, Clean Catch     Status: None   Collection Time: 09/30/23  8:27 PM  Result Value Ref Range   Color, Urine YELLOW YELLOW   APPearance CLEAR CLEAR   Specific Gravity, Urine 1.019 1.005 - 1.030   pH 5.0 5.0 - 8.0   Glucose, UA NEGATIVE NEGATIVE mg/dL   Hgb urine dipstick NEGATIVE NEGATIVE   Bilirubin Urine NEGATIVE NEGATIVE   Ketones, ur NEGATIVE NEGATIVE mg/dL   Protein, ur NEGATIVE NEGATIVE mg/dL   Nitrite NEGATIVE NEGATIVE   Leukocytes,Ua NEGATIVE NEGATIVE   Basic Metabolic Panel: Recent Labs  Lab 09/30/23 2016  NA 134*  K 3.5  CL 99  CO2 25  GLUCOSE 97  BUN 49*  CREATININE 1.03  CALCIUM 8.7*   Liver Function Tests: Recent Labs  Lab 09/30/23 2016  AST 93*  ALT 37  ALKPHOS 41  BILITOT 0.6  PROT 7.6  ALBUMIN 2.8*   No results for input(s): "LIPASE", "AMYLASE" in the last 168 hours. No results for input(s): "AMMONIA" in the last 168 hours. CBC: Recent Labs  Lab 09/30/23 1833  WBC 10.5  NEUTROABS 6.2  HGB 12.1*  HCT 39.0  MCV 76.6*  PLT 230   Cardiac Enzymes: No results for input(s): "CKTOTAL", "CKMB", "CKMBINDEX", "TROPONINIHS" in the last 168 hours.  BNP (last 3 results) No results for input(s): "PROBNP" in the last 8760 hours. CBG: No results for input(s): "GLUCAP" in  the last 168 hours.  Radiological Exams on Admission:  DG Knee 2 Views Left Result Date: 09/30/2023 CLINICAL DATA:  Knee pain and swelling. EXAM: LEFT KNEE - 1-2 VIEW COMPARISON:  03/11/2017 FINDINGS: No fracture. No dislocation. Mild tricompartmental osteoarthritis with peripheral spurring. No erosions. Small joint effusion. Generalized soft tissue edema, predominantly anterior and laterally. No soft tissue gas or radiopaque foreign body. No soft tissue calcifications.  IMPRESSION: 1. Generalized soft tissue edema. 2. Mild tricompartmental osteoarthritis. Small joint effusion. Electronically Signed   By: Narda Rutherford M.D.   On: 09/30/2023 23:39     No intake/output data recorded. No intake/output data recorded.        Family Communication: per primary team Primary team communication: discussed with Jamison Oka over phone as above. Thank you very much for involving [place] in the care of your patient.  Author: Nolberto Hanlon, MD 09/30/2023 11:59 PM  For on call review [email or web address].

## 2023-09-30 NOTE — Consult Note (Incomplete)
 Initial Consultation Note   Patient: Roberto Horne ZOX:[id number] DOB: August 07, 1960 PCP: Pcp, No DOA: 09/30/2023 DOS: the patient was seen and examined on 09/30/2023 Primary service: Benjiman Core, MD  Referring physician: *** Reason for consult: ***  Assessment/Plan: Assessment and Plan: No notes have been filed under this hospital service. Service: Hospitalist      {TRH_Follow_Sign_off:26779}  HPI: COLBURN ASPER is a 63 y.o. male with past medical history of asthma, HTN as well as "gout" that has been previously diagnosed via arthrocentesis (per ER report). Ptinet reports having a fall about 2 weeks ago with persistnt left knee pain since then. With apparently inability to tolerate activities of daily living at home. Patinet presents to ER today with left more than right knee pain. Patinet is s.p. colchicine, ketorolac and fentanyl and morpihne and oxycodone as well as prednisone. Attempt at ambulation apparently failed , medical eval is sought.  Patinet provided me the hisotry as mentioned above. No fever, rigor, penetrating injury. Patinet is concerned about knee looking big.  Review of Systems: As mentioned in the history of present illness. All other systems reviewed and are negative. Past Medical History:  Diagnosis Date  . Allergy   . Arthritis   . Asthma   . Depression   . GERD (gastroesophageal reflux disease)   . Gout   . Hyperlipemia   . Hypertension   . Mental impairment   . Pneumonia   . Sleep apnea    Past Surgical History:  Procedure Laterality Date  . BRAIN SURGERY    . FUNCTIONAL ENDOSCOPIC SINUS SURGERY  10/14/2015  . SINUS ENDO W/FUSION Bilateral 10/14/2015   Procedure: ENDOSCOPIC SINUS SURGERY WITH NAVIGATION;  Surgeon: Christia Reading, MD;  Location: [facility] OR;  Service: ENT;  Laterality: Bilateral;   Social History:  reports that he has never smoked. He has never used smokeless tobacco. He reports that he does not drink alcohol and does not use  drugs.  Allergies  Allergen Reactions  . Ibuprofen Itching    Family History  Problem Relation Age of Onset  . Arthritis Mother   . Hyperlipidemia Mother   . Hypertension Mother   . Diabetes Mother   . Stomach cancer Mother        possible, not 100 % sure  . Kidney disease Mother   . Arthritis Father   . Hypertension Father   . Diabetes Father   . Asthma Maternal Grandmother   . Rectal cancer Neg Hx   . Colon cancer Neg Hx     Prior to Admission medications   Medication Sig Start Date End Date Taking? Authorizing Provider  albuterol (VENTOLIN HFA) 108 (90 Base) MCG/ACT inhaler Inhale 2 puffs into the lungs every 4 (four) hours as needed for wheezing or shortness of breath. 05/17/21  Yes Olive Bass, FNP  allopurinol (ZYLOPRIM) 100 MG tablet Take 1 tablet (100 mg total) by mouth daily. 02/17/21  Yes Olive Bass, FNP  budesonide-formoterol [facility]) 160-4.5 MCG/ACT inhaler TAKE 2 PUFFS BY MOUTH TWICE A DAY 06/16/23  Yes Cathlean Marseilles A, NP  colchicine 0.6 MG tablet TAKE 1 TABLET (0.6 MG TOTAL) BY MOUTH DAILY AS NEEDED (GOUT PAIN). Pt needs an appointment. 09/30/23  Yes Halford Decamp, PA-C  metoprolol succinate (TOPROL-XL) 25 MG 24 hr tablet Take 1 tablet (25 mg total) by mouth daily. 02/17/21  Yes Olive Bass, FNP  omeprazole (PRILOSEC) 20 MG capsule Take 20 mg by mouth daily.   Yes [provider]  predniSONE (STERAPRED UNI-PAK 21 TAB) 10 MG (21) TBPK tablet Take by mouth daily. Take 6 tabs by mouth daily  for 2 days, then 5 tabs for 2 days, then 4 tabs for 2 days, then 3 tabs for 2 days, 2 tabs for 2 days, then 1 tab by mouth daily for 2 days 09/30/23  Yes Halford Decamp, PA-C  rosuvastatin (CRESTOR) 10 MG tablet Take 1 tablet (10 mg total) by mouth daily. 02/17/21  Yes Olive Bass, FNP  benzonatate (TESSALON) 100 MG capsule Take 1 capsule (100 mg total) by mouth 3 (three) times daily as needed for cough. Do not take with  alcohol or while driving or operating heavy machinery.  May cause drowsiness. 06/16/23   Valentino Nose, NP  desonide (DESOWEN) 0.05 % ointment Apply 1 Application topically as directed. 08/22/23   [provider]  fluticasone (FLONASE) 50 MCG/ACT nasal spray PLACE 1 SPRAY INTO BOTH NOSTRILS DAILY. 02/17/21   Olive Bass, FNP  guaiFENesin (MUCINEX) 600 MG 12 hr tablet Take 1 tablet (600 mg total) by mouth 2 (two) times daily. 06/16/23   Valentino Nose, NP  morphine (MSIR) 15 MG tablet Take 0.5 tablets (7.5 mg total) by mouth every 4 (four) hours as needed for severe pain. Patient not taking: Reported on 06/16/2023 05/18/21   Melene Plan, DO  OVER THE COUNTER MEDICATION Apply 1 application topically daily as needed (muscle pain). Pt states he uses an otc muscle rub that has a horse on the package    [provider]  Spacer/Aero-Holding Chambers (AEROCHAMBER MV) inhaler Use as instructed 08/21/17   Waymon Budge, MD    Physical Exam: Vitals:   09/30/23 2115 09/30/23 2200 09/30/23 2300 09/30/23 2330  BP: 119/86 129/88 127/76 129/84  Pulse: 93 90 80 80  Resp:  17 17 17   Temp:  98.5 F (36.9 C)    TempSrc:  Oral    SpO2: 97% 93% 97% 93%   *** Data Reviewed:  {Tip this will not be part of the note when signed- Document your independent interpretation of telemetry tracing, EKG, lab, Radiology test or any other diagnostic tests. Add any new diagnostic test ordered today. (Optional):26781} {Results:26384}  {Tip this will not be part of the note when signed  DVT Prophylaxis  .,  (Optional):26781}  Family Communication: *** Primary team communication: *** Thank you very much for involving [place] in the care of your patient.  Author: Nolberto Hanlon, MD 09/30/2023 11:59 PM  For on call review [email or web address].

## 2023-09-30 NOTE — ED Notes (Signed)
 Pt stated he doesn't want to walk then he said well idk

## 2023-09-30 NOTE — ED Notes (Signed)
 Tried to ambulate pt but pt could not stand up straight

## 2023-09-30 NOTE — ED Triage Notes (Signed)
 Pt arrived via EMS, from home. Has had bilateral leg pain, left more painful than the right. Hx of gout. States this feels like gout flareup. Baseline able to perform ADL's independently but has not been able to or get to bathroom due to pain.

## 2023-09-30 NOTE — ED Provider Notes (Cosign Needed Addendum)
 [facility] AT [facility] Provider Note   CSN: [id number] Arrival date & time: 09/30/23  1454     History  Chief Complaint  Patient presents with   Leg Pain    Roberto Horne is a 63 y.o. male with history of arthritis, gout, hypertension, hyperlipidemia, mental impairment l presents with complaints of left knee pain.  Worse on the right.  States this feels very similar to gout flareup.  No new injury or trauma.  No fevers or chills.  Reports when he has a gout flareup he is unable to walk.   Leg Pain  Past Medical History:  Diagnosis Date   Allergy    Arthritis    Asthma    Depression    GERD (gastroesophageal reflux disease)    Gout    Hyperlipemia    Hypertension    Mental impairment    Pneumonia    Sleep apnea        Home Medications Prior to Admission medications   Medication Sig Start Date End Date Taking? Authorizing Provider  predniSONE (STERAPRED UNI-PAK 21 TAB) 10 MG (21) TBPK tablet Take by mouth daily. Take 6 tabs by mouth daily  for 2 days, then 5 tabs for 2 days, then 4 tabs for 2 days, then 3 tabs for 2 days, 2 tabs for 2 days, then 1 tab by mouth daily for 2 days 09/30/23  Yes Halford Decamp, PA-C  albuterol (VENTOLIN HFA) 108 (90 Base) MCG/ACT inhaler Inhale 2 puffs into the lungs every 4 (four) hours as needed for wheezing or shortness of breath. 05/17/21   Olive Bass, FNP  allopurinol (ZYLOPRIM) 100 MG tablet Take 1 tablet (100 mg total) by mouth daily. 02/17/21   Olive Bass, FNP  benzonatate (TESSALON) 100 MG capsule Take 1 capsule (100 mg total) by mouth 3 (three) times daily as needed for cough. Do not take with alcohol or while driving or operating heavy machinery.  May cause drowsiness. 06/16/23   Valentino Nose, NP  budesonide-formoterol (SYMBICORT) 160-4.5 MCG/ACT inhaler TAKE 2 PUFFS BY MOUTH TWICE A DAY 06/16/23   Cathlean Marseilles A, NP  colchicine 0.6 MG tablet TAKE 1 TABLET (0.6  MG TOTAL) BY MOUTH DAILY AS NEEDED (GOUT PAIN). Pt needs an appointment. 09/30/23   Halford Decamp, PA-C  fluticasone (FLONASE) 50 MCG/ACT nasal spray PLACE 1 SPRAY INTO BOTH NOSTRILS DAILY. 02/17/21   Olive Bass, FNP  guaiFENesin (MUCINEX) 600 MG 12 hr tablet Take 1 tablet (600 mg total) by mouth 2 (two) times daily. 06/16/23   Valentino Nose, NP  metoprolol succinate (TOPROL-XL) 25 MG 24 hr tablet Take 1 tablet (25 mg total) by mouth daily. 02/17/21   Olive Bass, FNP  morphine (MSIR) 15 MG tablet Take 0.5 tablets (7.5 mg total) by mouth every 4 (four) hours as needed for severe pain. Patient not taking: Reported on 06/16/2023 05/18/21   Melene Plan, DO  omeprazole (PRILOSEC) 20 MG capsule Take 20 mg by mouth daily.    [provider]  OVER THE COUNTER MEDICATION Apply 1 application topically daily as needed (muscle pain). Pt states he uses an otc muscle rub that has a horse on the package    [provider]  rosuvastatin (CRESTOR) 10 MG tablet Take 1 tablet (10 mg total) by mouth daily. 02/17/21   Olive Bass, FNP  Spacer/Aero-Holding Chambers (AEROCHAMBER MV) inhaler Use as instructed 08/21/17   Waymon Budge,  MD      Allergies    Ibuprofen    Review of Systems   Review of Systems  Musculoskeletal:  Positive for arthralgias.    Physical Exam Updated Vital Signs BP 126/81 (BP Location: Left Arm)   Pulse 94   Temp 97.9 F (36.6 C) (Oral)   Resp 18   SpO2 100%  Physical Exam Vitals and nursing note reviewed.  Constitutional:      General: He is not in acute distress.    Appearance: He is well-developed.  HENT:     Head: Normocephalic and atraumatic.  Eyes:     Conjunctiva/sclera: Conjunctivae normal.  Cardiovascular:     Pulses: Normal pulses.  Pulmonary:     Effort: Pulmonary effort is normal. No respiratory distress.  Abdominal:     Palpations: Abdomen is soft.     Tenderness: There is no abdominal tenderness.   Musculoskeletal:     Cervical back: Neck supple.     Comments: Left knee with minimal warmth, there is swelling and generalized tenderness, does not tolerate any range of motion of knee.  Pulses symmetric, compartments soft  Skin:    General: Skin is warm and dry.     Capillary Refill: Capillary refill takes less than 2 seconds.  Neurological:     Mental Status: He is alert.  Psychiatric:        Mood and Affect: Mood normal.     ED Results / Procedures / Treatments   Labs (all labs ordered are listed, but only abnormal results are displayed) Labs Reviewed - No data to display  EKG None  Radiology No results found.  Procedures Procedures    Medications Ordered in ED Medications  predniSONE (DELTASONE) tablet 60 mg (has no administration in time range)  colchicine tablet 1.2 mg (has no administration in time range)  ketorolac (TORADOL) 15 MG/ML injection 15 mg (15 mg Intramuscular Given 09/30/23 1626)  oxyCODONE (Oxy IR/ROXICODONE) immediate release tablet 5 mg (5 mg Oral Given 09/30/23 1627)    ED Course/ Medical Decision Making/ A&P                                 Medical Decision Making Amount and/or Complexity of Data Reviewed Labs: ordered. Radiology: ordered.  Risk Prescription drug management. Decision regarding hospitalization.   This patient presents to the ED with chief complaint(s) of leg pain.  The complaint involves an extensive differential diagnosis and also carries with it a high risk of complications and morbidity.   pertinent past medical history as listed in HPI  The differential diagnosis includes  Septic joint, gout, fracture, dislocation, sprain, arthritis, abscess  Additional history obtained: Records reviewed Care Everywhere/External Records  Initial Assessment:   Nontoxic-appearing patient presenting with atraumatic left knee pain for the past few days.  On exam patient has generalized swelling and tenderness to left knee without  significant warmth.  Pulses symmetric.  Compartments soft.  No calf tenderness.  No fevers or chills.  Has a history of gout.  Has had arthrocentesis in the past, consistent with gout.  Symptoms feel very similar to prior episodes.  States he has been bedridden due to pain for the past few days.  Complains of some buttock pain.  On exam he does have some left-sided gluteal ulcerations.  There is no overlying warmth, fluctuance or induration at these locations.  Independent ECG interpretation:  none  Independent labs interpretation:  The following labs were independently interpreted:  CBC without significant abnormality  Independent visualization and interpretation of imaging: My personal interpretation of x-ray is no acute abnormality Ultrasound not available this evening.  Treatment and Reassessment: Patient given shot of Toradol and oxycodone upon first assessment  Upon reassessment patient feels that symptoms are improving.  Discussed discharge plan including course of prednisone, crutches and close PCP follow-up.  Patient is agreeable.  Attempted to ambulate but was unsuccessful, Will give dose of colchicine and prednisone, fentanyl.  Will additionally obtain left lower extremity vascularity to rule out DVT given unilateral swelling and x-rays.  Consultations obtained:   Hospitalist Dr. Maryjean Ka agreed for admission  Disposition:   Patient will be admitted for pain control for gout flare.   Social Determinants of Health:   none  This note was dictated with voice recognition software.  Despite best efforts at proofreading, errors may have occurred which can change the documentation meaning.          Final Clinical Impression(s) / ED Diagnoses Final diagnoses:  Left leg pain  Pressure injury of left buttock, stage 1    Rx / DC Orders ED Discharge Orders          Ordered    predniSONE (STERAPRED UNI-PAK 21 TAB) 10 MG (21) TBPK tablet  Daily        09/30/23 1716     colchicine 0.6 MG tablet        09/30/23 1716              Halford Decamp, PA-C 09/30/23 1718    Halford Decamp, PA-C 09/30/23 2330    Benjiman Core, MD 09/30/23 2332

## 2023-09-30 NOTE — ED Notes (Signed)
 This RN and Myriam Jacobson, NT attempted to ambulate patient. Patient was unable to stand up straight. This RN and Myriam Jacobson, NT changed patient's bed linens, gown, and applied new brief and chux.

## 2023-09-30 NOTE — ED Notes (Signed)
 Talked with provider about increasing issues that lead to ER visit and nonambulatory status

## 2023-10-01 DIAGNOSIS — M1990 Unspecified osteoarthritis, unspecified site: Secondary | ICD-10-CM

## 2023-10-01 DIAGNOSIS — M79605 Pain in left leg: Secondary | ICD-10-CM | POA: Diagnosis not present

## 2023-10-01 MED ORDER — PANTOPRAZOLE SODIUM 40 MG PO TBEC
40.0000 mg | DELAYED_RELEASE_TABLET | Freq: Every day | ORAL | Status: DC
  Administered 2023-10-01 – 2023-10-02 (×2): 40 mg via ORAL
  Filled 2023-10-01 (×2): qty 1

## 2023-10-01 MED ORDER — ROSUVASTATIN CALCIUM 20 MG PO TABS
10.0000 mg | ORAL_TABLET | Freq: Every day | ORAL | Status: DC
  Administered 2023-10-01 – 2023-10-02 (×2): 10 mg via ORAL
  Filled 2023-10-01 (×2): qty 1

## 2023-10-01 MED ORDER — METOPROLOL SUCCINATE ER 25 MG PO TB24
25.0000 mg | ORAL_TABLET | Freq: Every day | ORAL | Status: DC
  Administered 2023-10-01 – 2023-10-02 (×2): 25 mg via ORAL
  Filled 2023-10-01 (×2): qty 1

## 2023-10-01 NOTE — Assessment & Plan Note (Signed)
 Left knee arthritis with mild-moderate effusion. No systemic toxicity, fever, wbc. Good response to Rx in the ER patient has documented excellent range of motion . Xray reassuring.   My concern for septic arthritis is very low. Concern for osteoarthritis vs. Mild gout flare. Good response to Rx , in Er. C.w. colchicine as tolerated. Attempt ambulation. If any concern, consider orthopedic eval for question of  simultaneous joint tap and lidocaine/steroid injection for comfort.

## 2023-10-01 NOTE — [place] FL2 (Signed)
 [place] MEDICAID FL2 LEVEL OF CARE FORM     IDENTIFICATION  Patient Name: Roberto Horne Birthdate: 1961/03/03 Sex: male Admission Date (Current Location): 09/30/2023  [facility] and [place] Number:  Producer, television/film/video and Address:  [facility],  [address] [place], [place] [address]      Provider Number: [id number]  Attending Physician Name and Address:  System, Provider Not In  Relative Name and Phone Number:       Current Level of Care: Hospital Recommended Level of Care: Skilled Nursing Facility Prior Approval Number:    Date Approved/Denied:   PASRR Number: [id number]  Discharge Plan: SNF    Current Diagnoses: Patient Active Problem List   Diagnosis Date Noted   Arthritis 10/01/2023   Acute pain of left knee 07/28/2018   Intellectual disability 12/03/2016   Dental caries 12/03/2016   Obesity 10/01/2016   Left foot pain 08/30/2015   Acute upper respiratory infection 08/30/2015   Nasal polyps 07/21/2015   Contusion, arm, upper 03/01/2015   Hand contusion 03/01/2015   Obstructive sleep apnea 12/16/2014   Seasonal and perennial allergic rhinitis 09/21/2014   Medicare annual wellness visit, subsequent 09/07/2014   CAP (community acquired pneumonia) 07/07/2014   Effusion of right knee 07/07/2014   Hypertension 07/07/2014   Abdominal pain 07/07/2014   Pneumonia 07/07/2014   Asthma, mild persistent 12/15/2012   Gout, unspecified 12/15/2012   Esophageal reflux 12/15/2012   HTN (hypertension) 11/06/2012   Hyperlipidemia 11/06/2012    Orientation RESPIRATION BLADDER Height & Weight     Self, Time, Situation, Place  Normal Continent Weight:   Height:     BEHAVIORAL SYMPTOMS/MOOD NEUROLOGICAL BOWEL NUTRITION STATUS      Continent Diet  AMBULATORY STATUS COMMUNICATION OF NEEDS Skin   Extensive Assist Verbally Normal                       Personal Care Assistance Level of Assistance  Bathing, Dressing, Feeding Bathing Assistance: Maximum  assistance Feeding assistance: Limited assistance Dressing Assistance: Maximum assistance     Functional Limitations Info  Sight, Hearing, Speech Sight Info: Adequate Hearing Info: Adequate Speech Info: Adequate    SPECIAL CARE FACTORS FREQUENCY  PT (By licensed PT), OT (By licensed OT)     PT Frequency: 5x weekly OT Frequency: 5x weekly            Contractures Contractures Info: Not present    Additional Factors Info  Allergies, Code Status Code Status Info: Full Code Allergies Info: Ibuprofen           Current Medications (10/01/2023):  This is the current hospital active medication list Current Facility-Administered Medications  Medication Dose Route Frequency Provider Last Rate Last Admin   metoprolol succinate (TOPROL-XL) 24 hr tablet 25 mg  25 mg Oral Daily Halford Decamp, PA-C   25 mg at 10/01/23 0955   pantoprazole (PROTONIX) EC tablet 40 mg  40 mg Oral Daily Halford Decamp, PA-C   40 mg at 10/01/23 5621   rosuvastatin (CRESTOR) tablet 10 mg  10 mg Oral Daily Halford Decamp, PA-C   10 mg at 10/01/23 3086   Current Outpatient Medications  Medication Sig Dispense Refill   albuterol (VENTOLIN HFA) 108 (90 Base) MCG/ACT inhaler Inhale 2 puffs into the lungs every 4 (four) hours as needed for wheezing or shortness of breath. 6.7 g 2   allopurinol (ZYLOPRIM) 100 MG tablet Take 1 tablet (100 mg total) by mouth daily.  90 tablet 3   colchicine 0.6 MG tablet TAKE 1 TABLET (0.6 MG TOTAL) BY MOUTH DAILY AS NEEDED (GOUT PAIN). Pt needs an appointment. (Patient taking differently: Take 0.6-1.2 mg by mouth See admin instructions. Take two tablets 1.2 at the first sign of gout flare, take one tablet 0.6 mg one hour later.) 1 tablet 0   desonide (DESOWEN) 0.05 % ointment Apply 1 Application topically as directed.     metoprolol succinate (TOPROL-XL) 25 MG 24 hr tablet Take 1 tablet (25 mg total) by mouth daily. 90 tablet 3   omeprazole (PRILOSEC) 20 MG capsule Take 20  mg by mouth daily.     predniSONE (STERAPRED UNI-PAK 21 TAB) 10 MG (21) TBPK tablet Take by mouth daily. Take 6 tabs by mouth daily  for 2 days, then 5 tabs for 2 days, then 4 tabs for 2 days, then 3 tabs for 2 days, 2 tabs for 2 days, then 1 tab by mouth daily for 2 days 42 tablet 0   rosuvastatin (CRESTOR) 20 MG tablet Take 20 mg by mouth at bedtime.     predniSONE (DELTASONE) 20 MG tablet Take 20-120 mg by mouth See admin instructions. Day 1: Take six tablets by mouth. Day 2: Take five tablets by mouth. Day 3: take four tablets by mouth. Day 4: take three tablets by mouth. Day 5: Take two tablets by mouth. Day 6: Take one tablet by mouth. (Patient not taking: Reported on 10/01/2023)       Discharge Medications: Please see discharge summary for a list of discharge medications.  Relevant Imaging Results:  Relevant Lab Results:   Additional Information SSN: [id number]  Inis Sizer, LCSW

## 2023-10-01 NOTE — ED Provider Notes (Signed)
 Emergency Medicine Observation Re-evaluation Note  Roberto Horne is a 63 y.o. male, seen on rounds today.  Pt initially presented to the ED for complaints of Leg Pain Currently, the patient is awaiting placement.  Physical Exam  BP 125/86   Pulse 87   Temp 98 F (36.7 C)   Resp 18   SpO2 99%  Physical Exam General: Calm Cardiac: Well perfused Lungs: Even respirations  ED Course / MDM  EKG:   I have reviewed the labs performed to date as well as medications administered while in observation.  Recent changes in the last 24 hours include evaluated by PT who recommend acute rehab and PT for 2 weeks.  Plan  Current plan is for TOC placement.    Maia Plan, MD 10/01/23 1224

## 2023-10-01 NOTE — ED Notes (Signed)
 Pt sleeping.

## 2023-10-01 NOTE — Evaluation (Signed)
 Physical Therapy Evaluation Patient Details Name: Roberto Horne MRN: [id number] DOB: 20-Dec-1960 Today's Date: 10/01/2023  History of Present Illness  Roberto Horne is a 63 y.o. male with history of arthritis, gout, hypertension, hyperlipidemia, mental impairmentl presents 09/30/23 with complaints of left and right  knee pain.    States this feels very similar to gout flareup  Clinical Impression  Pt admitted with above diagnosis.  Pt currently with functional limitations due to the deficits listed below (see PT Problem List). Pt will benefit from acute skilled PT to increase their independence and safety with mobility to allow discharge.       The patient is eager to attempt standing, stating" I want to get better."  Patient with noted L foot/leg edema and foot drop with limited dorsiflexion range.  Patient unable to indicate if this is chronic vs new.  Patient  assisted to stand x 4 and was able to take small scooting/ step sideways a;long the bed with +2 mod support.  No family present. Patient reports resides with brother and sister in law  Patient reports that he uses crutches at home and has had  a fall(fell forward). Patient will benefit from continued inpatient follow up therapy, <3 hours/day     If plan is discharge home, recommend the following: Two people to help with walking and/or transfers;A lot of help with bathing/dressing/bathroom;Assistance with cooking/housework;Assist for transportation;Help with stairs or ramp for entrance   Can travel by private vehicle   No    Equipment Recommendations Rolling walker (2 wheels)  Recommendations for Other Services       Functional Status Assessment Patient has had a recent decline in their functional status and demonstrates the ability to make significant improvements in function in a reasonable and predictable amount of time.     Precautions / Restrictions Precautions Precautions: Fall Restrictions Weight Bearing  Restrictions Per Provider Order: No      Mobility  Bed Mobility Overal bed mobility: Needs Assistance Bed Mobility: Supine to Sit, Sit to Supine     Supine to sit: Mod assist, HOB elevated, Used rails Sit to supine: Min assist   General bed mobility comments: assisted with trunk to fully upright postiton, assisted with LLE    Transfers Overall transfer level: Needs assistance Equipment used: Rolling walker (2 wheels) Transfers: Sit to/from Stand Sit to Stand: Max assist, Mod assist, From elevated surface           General transfer comment: Assist to power up to stand with 2 max support, Tends to lift RW vs pushing down. On First  standing  trial, Left foot sliding forward. Patient stood   again at [facility] with mod support of 2  and took 2 small side steps, on 3rd trial  stood for  brief change, 4th trial able to scoot  along the bed and able to take small steps x 2, otherwise scooted right foot and dragged left, Support at left foot to prevent sliding as lacks neutral Dorsiflexion Range..    Ambulation/Gait                  Stairs            Wheelchair Mobility     Tilt Bed    Modified Rankin (Stroke Patients Only)       Balance Overall balance assessment: Needs assistance, History of Falls Sitting-balance support: Bilateral upper extremity supported, Feet supported Sitting balance-Leahy Scale: Fair     Standing balance support: During  functional activity, Bilateral upper extremity supported, Reliant on assistive device for balance Standing balance-Leahy Scale: Poor Standing balance comment: initially posterior leans, improved with practice                             Pertinent Vitals/Pain Pain Assessment Pain Assessment: Faces Faces Pain Scale: Hurts even more Pain Location: left foot and knee, had difficulty expressing pain Pain Descriptors / Indicators: Discomfort, Grimacing Pain Intervention(s): Monitored during session    Home  Living Family/patient expects to be discharged to:: Private residence Living Arrangements: Other relatives Available Help at Discharge: Family;Available PRN/intermittently Type of Home: House Home Access: Level entry       Home Layout: One level Home Equipment: Cane - single point;Crutches      Prior Function Prior Level of Function : Independent/Modified Independent;Needs assist             Mobility Comments: reports uses crutches       Extremity/Trunk Assessment   Upper Extremity Assessment Upper Extremity Assessment: Overall WFL for tasks assessed;Left hand dominant;LUE deficits/detail LUE Deficits / Details: decreased grip    Lower Extremity Assessment Lower Extremity Assessment: LLE deficits/detail;RLE deficits/detail RLE Deficits / Details: grosslt WFL ROM LLE Deficits / Details: noted foot drop, decrwased doersiflexion ROM, does not flex to neutral, noted edema of the left foot and leg. Patient unable to give thoughts on this being chronic    Cervical / Trunk Assessment Cervical / Trunk Assessment: Normal  Communication   Communication Communication: Impaired Factors Affecting Communication: Difficulty expressing self    Cognition Arousal: Alert Behavior During Therapy: WFL for tasks assessed/performed   PT - Cognitive impairments: History of cognitive impairments                         Following commands: Intact       Cueing Cueing Techniques: Verbal cues     General Comments      Exercises     Assessment/Plan    PT Assessment Patient needs continued PT services  PT Problem List Decreased strength;Decreased range of motion;Decreased cognition;Decreased activity tolerance;Decreased knowledge of use of DME;Decreased balance;Decreased safety awareness;Decreased mobility;Decreased knowledge of precautions       PT Treatment Interventions DME instruction;Therapeutic exercise;Gait training;Functional mobility training;Therapeutic  activities;Patient/family education    PT Goals (Current goals can be found in the Care Plan section)  Acute Rehab PT Goals Patient Stated Goal: I want to get better PT Goal Formulation: With patient Time For Goal Achievement: 10/15/23 Potential to Achieve Goals: Fair    Frequency Min 2X/week     Co-evaluation               AM-PAC PT "6 Clicks" Mobility  Outcome Measure Help needed turning from your back to your side while in a flat bed without using bedrails?: A Lot Help needed moving from lying on your back to sitting on the side of a flat bed without using bedrails?: A Lot Help needed moving to and from a bed to a chair (including a wheelchair)?: A Lot Help needed standing up from a chair using your arms (e.g., wheelchair or bedside chair)?: A Lot Help needed to walk in hospital room?: Total Help needed climbing 3-5 steps with a railing? : Total 6 Click Score: 10    End of Session Equipment Utilized During Treatment: Gait belt Activity Tolerance: Patient tolerated treatment well Patient left: in bed;with call bell/phone within  reach Nurse Communication: Mobility status PT Visit Diagnosis: Unsteadiness on feet (R26.81);History of falling (Z91.81);Other abnormalities of gait and mobility (R26.89);Muscle weakness (generalized) (M62.81);Pain Pain - Right/Left: Left Pain - part of body: Knee    Time: 1610-9604 PT Time Calculation (min) (ACUTE ONLY): 14 min   Charges:   PT Evaluation $PT Eval Low Complexity: 1 Low   PT General Charges $$ ACUTE PT VISIT: 1 Visit         Blanchard Kelch PT Acute Rehabilitation Services Office [phone] Weekend pager-[phone]   Rada Hay 10/01/2023, 11:37 AM

## 2023-10-01 NOTE — Progress Notes (Signed)
 CSW received secure chat from [place], [place] who states she received call from patient's sister in law Patsy Lager who is requesting patient be placed in a facility.  CSW spoke with patient's sister in law [place] who states patient's mentation has decreased recently. Patsy Lager states patient is having a gout flare up that is causing him pain, which prohibits him from ambulating independently. Patsy Lager states patient is typically independent but has not been himself recently. [name] agreeable for CSW to initiate SNF workup and understands bed offers will be presented to her once they become available.  Edwin Dada, MSW, LCSW Transitions of Care  Clinical Social Worker II [phone]

## 2023-10-01 NOTE — ED Notes (Signed)
 Changed pt some read spotting appeared

## 2023-10-01 NOTE — Progress Notes (Signed)
 CSW spoke with patients sister Patsy Lager. CM presented the following bed offers [facility], [facility], [facility], [facility], [facility], and [facility]. The patients sister stated she couldn't make a decision now and would do her research and give CSW an answer in the morning.

## 2023-10-02 DIAGNOSIS — M79605 Pain in left leg: Secondary | ICD-10-CM | POA: Diagnosis not present

## 2023-10-02 NOTE — ED Provider Notes (Signed)
 Emergency Medicine Observation Re-evaluation Note  Roberto Horne is a 63 y.o. male, seen on rounds today.  Pt initially presented to the ED for complaints of Leg Pain Currently, the patient is awaiting transport to facility.  Physical Exam  BP 115/83   Pulse 87   Temp 97.9 F (36.6 C) (Oral)   Resp 16   SpO2 100%  Physical Exam General: Calm Cardiac: Blood pressure and heart rate normal Lungs: Respiratory rate and oxygen saturations normal   ED Course / MDM  EKG:   I have reviewed the labs performed to date as well as medications administered while in observation.  Recent changes in the last 24 hours include patient has been placed in skilled nursing facility.  Plan  Current plan is for transfer to nursing facility.    Margarita Grizzle, MD 10/02/23 [phone]

## 2023-10-02 NOTE — Progress Notes (Addendum)
 9:50am: Patient will go to room 207B at [facility] via [facility] - RN to call when ready. The number to call for report is [phone].  CSW returned call call to patient's sister Patsy Lager to inform to her of discharge plan. Patsy Lager will visit patient at the facility later today after he arrives.  CSW informed RN of information.  8:35am: CSW received insurance authorization approval for patient, #[id number] and next review date 10/04/23.  CSW notified Luanna Cole at [facility] of approval.   8:15am: CSW spoke with patient's sister Patsy Lager who states she wants to accept bed offer from [facility].   CSW will initiate insurance authorization.  Edwin Dada, MSW, LCSW Transitions of Care  Clinical Social Worker II [phone]

## 2023-11-25 ENCOUNTER — Other Ambulatory Visit: Payer: Self-pay

## 2023-11-25 ENCOUNTER — Emergency Department (HOSPITAL_COMMUNITY)

## 2023-11-25 ENCOUNTER — Encounter (HOSPITAL_COMMUNITY): Payer: Self-pay

## 2023-11-25 ENCOUNTER — Emergency Department (HOSPITAL_COMMUNITY)
Admission: EM | Admit: 2023-11-25 | Discharge: 2023-11-25 | Disposition: A | Discharge status: Skilled Nursing Facility | Attending: Emergency Medicine | Admitting: Emergency Medicine

## 2023-11-25 DIAGNOSIS — F79 Unspecified intellectual disabilities: Secondary | ICD-10-CM | POA: Diagnosis not present

## 2023-11-25 DIAGNOSIS — M25551 Pain in right hip: Secondary | ICD-10-CM | POA: Diagnosis not present

## 2023-11-25 DIAGNOSIS — I10 Essential (primary) hypertension: Secondary | ICD-10-CM | POA: Diagnosis not present

## 2023-11-25 DIAGNOSIS — W19XXXA Unspecified fall, initial encounter: Secondary | ICD-10-CM

## 2023-11-25 DIAGNOSIS — W06XXXA Fall from bed, initial encounter: Secondary | ICD-10-CM | POA: Diagnosis not present

## 2023-11-25 DIAGNOSIS — M25571 Pain in right ankle and joints of right foot: Secondary | ICD-10-CM | POA: Diagnosis present

## 2023-11-25 DIAGNOSIS — S0990XA Unspecified injury of head, initial encounter: Secondary | ICD-10-CM | POA: Diagnosis not present

## 2023-11-25 DIAGNOSIS — M79651 Pain in right thigh: Secondary | ICD-10-CM | POA: Insufficient documentation

## 2023-11-25 LAB — CBC WITH DIFFERENTIAL/PLATELET
Abs Immature Granulocytes: 0.03 10*3/uL (ref 0.00–0.07)
Basophils Absolute: 0 10*3/uL (ref 0.0–0.1)
Basophils Relative: 0 %
Eosinophils Absolute: 0.3 10*3/uL (ref 0.0–0.5)
Eosinophils Relative: 3 %
HCT: 35.3 % — ABNORMAL LOW (ref 39.0–52.0)
Hemoglobin: 10.9 g/dL — ABNORMAL LOW (ref 13.0–17.0)
Immature Granulocytes: 0 %
Lymphocytes Relative: 33 %
Lymphs Abs: 3.1 10*3/uL (ref 0.7–4.0)
MCH: 23.6 pg — ABNORMAL LOW (ref 26.0–34.0)
MCHC: 30.9 g/dL (ref 30.0–36.0)
MCV: 76.6 fL — ABNORMAL LOW (ref 80.0–100.0)
Monocytes Absolute: 1.3 10*3/uL — ABNORMAL HIGH (ref 0.1–1.0)
Monocytes Relative: 14 %
Neutro Abs: 4.6 10*3/uL (ref 1.7–7.7)
Neutrophils Relative %: 50 %
Platelets: 226 10*3/uL (ref 150–400)
RBC: 4.61 MIL/uL (ref 4.22–5.81)
RDW: 17.6 % — ABNORMAL HIGH (ref 11.5–15.5)
WBC: 9.3 10*3/uL (ref 4.0–10.5)
nRBC: 0 % (ref 0.0–0.2)

## 2023-11-25 LAB — URINALYSIS, ROUTINE W REFLEX MICROSCOPIC
Bacteria, UA: NONE SEEN
Bilirubin Urine: NEGATIVE
Glucose, UA: NEGATIVE mg/dL
Hgb urine dipstick: NEGATIVE
Ketones, ur: NEGATIVE mg/dL
Leukocytes,Ua: NEGATIVE
Nitrite: NEGATIVE
Protein, ur: NEGATIVE mg/dL
Specific Gravity, Urine: 1.013 (ref 1.005–1.030)
pH: 7 (ref 5.0–8.0)

## 2023-11-25 LAB — BASIC METABOLIC PANEL WITH GFR
Anion gap: 10 (ref 5–15)
BUN: 12 mg/dL (ref 8–23)
CO2: 27 mmol/L (ref 22–32)
Calcium: 9.3 mg/dL (ref 8.9–10.3)
Chloride: 101 mmol/L (ref 98–111)
Creatinine, Ser: 0.77 mg/dL (ref 0.61–1.24)
GFR, Estimated: >60 mL/min (ref >60)
Glucose, Bld: 103 mg/dL — ABNORMAL HIGH (ref 70–99)
Potassium: 4.3 mmol/L (ref 3.5–5.1)
Sodium: 138 mmol/L (ref 135–145)

## 2023-11-25 NOTE — ED Notes (Signed)
 Discharge instructions reviewed.   Opportunity for questions and concerns provided.   Alert, oriented and escorted back to [facility] via [facility] .   Displays no signs of distress.

## 2023-11-25 NOTE — Discharge Instructions (Addendum)
 Your workup tonight was reassuring.  You may follow-up with your primary care team as needed for further evaluation.

## 2023-11-25 NOTE — ED Notes (Signed)
 Patient transported to CT

## 2023-11-25 NOTE — ED Triage Notes (Addendum)
 Arrives [facility] from [facility] after rolling out of bed.   Pain worse on right left and right ankle.   Family reports subjective urinary retention with a foul odor.   Hx TBI

## 2023-11-25 NOTE — ED Provider Notes (Signed)
 [place] EMERGENCY DEPARTMENT AT [facility] Provider Note   CSN: [id number] Arrival date & time: 11/25/23  0051     History  Chief Complaint  Patient presents with   [facility]    Roberto Horne is a 63 y.o. male.  Patient with past medical history seen for brain surgery, intellectual disability, hypertension, gout presents to the emergency department from [facility] via [facility] after a fall.  Patient reports trying to reach for his water and rolling out of his bed.  He complains of pain on the right hip/thigh and right ankle.  He does think he may have bumped his head.  He denies using blood thinners and denies loss of consciousness.  Family reports foul-smelling urine.  Patient denies other complaints at this time.   Fall       Home Medications Prior to Admission medications   Medication Sig Start Date End Date Taking? Authorizing Provider  albuterol  (VENTOLIN  HFA) 108 (90 Base) MCG/ACT inhaler Inhale 2 puffs into the lungs every 4 (four) hours as needed for wheezing or shortness of breath. 05/17/21   Adra Alanis, FNP  allopurinol  (ZYLOPRIM ) 100 MG tablet Take 1 tablet (100 mg total) by mouth daily. 02/17/21   Adra Alanis, FNP  colchicine  0.6 MG tablet TAKE 1 TABLET (0.6 MG TOTAL) BY MOUTH DAILY AS NEEDED (GOUT PAIN). Pt needs an appointment. Patient taking differently: Take 0.6-1.2 mg by mouth See admin instructions. Take two tablets 1.2 at the first sign of gout flare, take one tablet 0.6 mg one hour later. 09/30/23   Felicie Horning, PA-C  desonide (DESOWEN) 0.05 % ointment Apply 1 Application topically as directed. 08/22/23   [provider]  metoprolol  succinate (TOPROL -XL) 25 MG 24 hr tablet Take 1 tablet (25 mg total) by mouth daily. 02/17/21   Adra Alanis, FNP  omeprazole  (PRILOSEC) 20 MG capsule Take 20 mg by mouth daily.    [provider]  predniSONE  (DELTASONE ) 20 MG tablet Take 20-120 mg by mouth See admin  instructions. Day 1: Take six tablets by mouth. Day 2: Take five tablets by mouth. Day 3: take four tablets by mouth. Day 4: take three tablets by mouth. Day 5: Take two tablets by mouth. Day 6: Take one tablet by mouth. Patient not taking: Reported on 10/01/2023 09/30/23   [provider]  predniSONE  (STERAPRED UNI-PAK 21 TAB) 10 MG (21) TBPK tablet Take by mouth daily. Take 6 tabs by mouth daily  for 2 days, then 5 tabs for 2 days, then 4 tabs for 2 days, then 3 tabs for 2 days, 2 tabs for 2 days, then 1 tab by mouth daily for 2 days 09/30/23   Felicie Horning, PA-C  rosuvastatin  (CRESTOR ) 20 MG tablet Take 20 mg by mouth at bedtime. 04/17/23   [provider]      Allergies    Ibuprofen    Review of Systems   Review of Systems  Physical Exam Updated Vital Signs BP (!) 147/112 (BP Location: Right Arm)   Pulse 96   Temp 98.6 F (37 C) (Oral)   Resp (!) 22   Ht 5\' 11"  (1.803 m)   Wt 131.5 kg   SpO2 100%   BMI 40.45 kg/m  Physical Exam Vitals and nursing note reviewed.  Constitutional:      General: He is not in acute distress.    Appearance: He is well-developed.  HENT:     Head: Normocephalic and atraumatic.  Eyes:     Conjunctiva/sclera: Conjunctivae normal.  Cardiovascular:     Rate and Rhythm: Normal rate and regular rhythm.  Pulmonary:     Effort: Pulmonary effort is normal. No respiratory distress.     Breath sounds: Normal breath sounds.  Abdominal:     Palpations: Abdomen is soft.     Tenderness: There is no abdominal tenderness.  Musculoskeletal:        General: Swelling present.     Cervical back: Neck supple.     Comments: Mild swelling noted at right lateral ankle with some associated tenderness.  Patient able to move right leg but at rest holds it somewhat contracted and internally rotated.  No increase in pain with passive flexion of the right knee or hip.  Skin:    General: Skin is warm and dry.     Capillary Refill: Capillary refill takes  less than 2 seconds.  Neurological:     Mental Status: He is alert. Mental status is at baseline.  Psychiatric:        Mood and Affect: Mood normal.     ED Results / Procedures / Treatments   Labs (all labs ordered are listed, but only abnormal results are displayed) Labs Reviewed  BASIC METABOLIC PANEL WITH GFR - Abnormal; Notable for the following components:      Result Value   Glucose, Bld 103 (*)    All other components within normal limits  CBC WITH DIFFERENTIAL/PLATELET - Abnormal; Notable for the following components:   Hemoglobin 10.9 (*)    HCT 35.3 (*)    MCV 76.6 (*)    MCH 23.6 (*)    RDW 17.6 (*)    Monocytes Absolute 1.3 (*)    All other components within normal limits  URINALYSIS, ROUTINE W REFLEX MICROSCOPIC    EKG None  Radiology DG Ankle 2 Views Right Result Date: 11/25/2023 EXAM: 2 VIEW(S) XRAY OF THE RIGHT ANKLE 11/25/2023 01:34:00 AM CLINICAL HISTORY: Right hip/thigh/ankle pain. COMPARISON: None available. FINDINGS: BONES AND JOINTS: No acute fracture. No focal osseous lesion. No joint dislocation. Mild degenerative changes of the tibiotalar joint. SOFT TISSUES: The soft tissues are unremarkable. IMPRESSION: 1. No acute osseous abnormality. Electronically signed by: Zadie Herter MD 11/25/2023 01:43 AM EDT RP Workstation: [id number]   DG Knee 2 Views Right Result Date: 11/25/2023 EXAM: 2 VIEWS XRAY OF THE RIGHT KNEE 11/25/2023 01:34:00 AM COMPARISON: None available. CLINICAL HISTORY: Right hip/thigh/ankle pain. FINDINGS: BONES AND JOINTS: No acute fracture. No focal osseous lesion. No joint dislocation. Mild degenerative changes, most prominent in the medial and patellofemoral compartments. SOFT TISSUES: Small suprapatellar joint effusion. IMPRESSION: 1. Small suprapatellar joint effusion. 2. Mild degenerative changes, most prominent in the medial and patellofemoral compartments. Electronically signed by: Zadie Herter MD 11/25/2023 01:42 AM EDT RP  Workstation: [id number]   DG Hip Unilat W or Wo Pelvis 2-3 Views Right Result Date: 11/25/2023 EXAM: 2 or 3 VIEW(S) XRAY OF THE RIGHT HIP 11/25/2023 01:34:00 AM COMPARISON: None available. CLINICAL HISTORY: Right hip/thigh/ankle pain. FINDINGS: BONES AND JOINTS: No acute fracture or focal osseous lesion. The hip joint is maintained. Degenerative changes of the lumbar spine. SOFT TISSUES: The soft tissues are unremarkable. IMPRESSION: 1. No acute findings in the right hip. Electronically signed by: Zadie Herter MD 11/25/2023 01:42 AM EDT RP Workstation: [id number]   CT Head Wo Contrast Result Date: 11/25/2023 EXAM: CT HEAD AND CERVICAL SPINE 11/25/2023 01:20:43 AM TECHNIQUE: CT of the head and cervical spine was performed without the  administration of intravenous contrast. Multiplanar reformatted images are provided for review. Automated exposure control, iterative reconstruction, and/or weight based adjustment of the mA/kV was utilized to reduce the radiation dose to as low as reasonably achievable. COMPARISON: None available. CLINICAL HISTORY: Minor head trauma, hx TBI. Chief complaints; Fall; CT Head Wo Contrast; Minor head trauma, hx TBI; CT Cervical Spine Wo Contrast; Neck trauma, patient poor historian. FINDINGS: HEAD: BRAIN AND VENTRICLES: There is no acute intracranial hemorrhage, mass effect or midline shift. No abnormal extra-axial fluid collection. The gray-white differentiation is maintained without evidence of an acute infarct. There is no evidence of hydrocephalus. Mild subcortical and periventricular small vessel ischemic changes. ORBITS: The visualized portion of the orbits demonstrate no acute abnormality. SINUSES: The visualized paranasal sinuses and mastoid air cells demonstrate no acute abnormality. SOFT TISSUES AND SKULL: No acute abnormality of the visualized skull or soft tissues. CERVICAL SPINE: BONES AND ALIGNMENT: There is no acute fracture or traumatic malalignment. DEGENERATIVE  CHANGES: Mild degenerative changes of the mid cervical spine, most prominent at C4-5. SOFT TISSUES: There is no prevertebral soft tissue swelling. IMPRESSION: 1. No acute intracranial abnormality. 2. No acute fracture in the cervical spine. Electronically signed by: Zadie Herter MD 11/25/2023 01:25 AM EDT RP Workstation: [id number]   CT Cervical Spine Wo Contrast Result Date: 11/25/2023 EXAM: CT HEAD AND CERVICAL SPINE 11/25/2023 01:20:43 AM TECHNIQUE: CT of the head and cervical spine was performed without the administration of intravenous contrast. Multiplanar reformatted images are provided for review. Automated exposure control, iterative reconstruction, and/or weight based adjustment of the mA/kV was utilized to reduce the radiation dose to as low as reasonably achievable. COMPARISON: None available. CLINICAL HISTORY: Minor head trauma, hx TBI. Chief complaints; Fall; CT Head Wo Contrast; Minor head trauma, hx TBI; CT Cervical Spine Wo Contrast; Neck trauma, patient poor historian. FINDINGS: HEAD: BRAIN AND VENTRICLES: There is no acute intracranial hemorrhage, mass effect or midline shift. No abnormal extra-axial fluid collection. The gray-white differentiation is maintained without evidence of an acute infarct. There is no evidence of hydrocephalus. Mild subcortical and periventricular small vessel ischemic changes. ORBITS: The visualized portion of the orbits demonstrate no acute abnormality. SINUSES: The visualized paranasal sinuses and mastoid air cells demonstrate no acute abnormality. SOFT TISSUES AND SKULL: No acute abnormality of the visualized skull or soft tissues. CERVICAL SPINE: BONES AND ALIGNMENT: There is no acute fracture or traumatic malalignment. DEGENERATIVE CHANGES: Mild degenerative changes of the mid cervical spine, most prominent at C4-5. SOFT TISSUES: There is no prevertebral soft tissue swelling. IMPRESSION: 1. No acute intracranial abnormality. 2. No acute fracture in the  cervical spine. Electronically signed by: Zadie Herter MD 11/25/2023 01:25 AM EDT RP Workstation: [id number]    Procedures Procedures    Medications Ordered in ED Medications - No data to display  ED Course/ Medical Decision Making/ A&P                                 Medical Decision Making Amount and/or Complexity of Data Reviewed Labs: ordered. Radiology: ordered.   This patient presents to the ED for concern of a fall, this involves an extensive number of treatment options, and is a complaint that carries with it a high risk of complications and morbidity.  The differential diagnosis includes fracture, dislocation, intracranial abnormality, soft tissue injury, others   Co morbidities / Chronic conditions that complicate the patient evaluation  Intellectual disability, history of brain  surgery/TBI   Additional history obtained:  Additional history obtained from EMR  Lab Tests:  I Ordered, and personally interpreted labs.  The pertinent results include: Grossly unremarkable CBC, BMP, UA   Imaging Studies ordered:  I ordered imaging studies including CT head and cervical spine, plain films of the right hip, knee, ankle I independently visualized and interpreted imaging which showed no acute findings on the CT scans of the head or cervical spine.  Hip and ankle x-rays are unremarkable.  Knee x-ray shows: 1. Small suprapatellar joint effusion.  2. Mild degenerative changes, most prominent in the medial and patellofemoral  compartments.   I agree with the radiologist interpretation   Social Determinants of Health:  Patient with history of intellectual disability lives in an assisted living facility   Test / Admission - Considered:  No acute findings on imaging or lab work.  Patient with fall with no obvious injuries.  Patient stable for discharge back to his assisted living facility at this time.         Final Clinical Impression(s) / ED  Diagnoses Final diagnoses:  Fall, initial encounter    Rx / DC Orders ED Discharge Orders     None         Delories Fetter 11/25/23 [id number]    Eldon Greenland, MD 11/25/23 [phone]
